# Patient Record
Sex: Female | Born: 1970 | Race: White | Hispanic: No | Smoking: Current every day smoker
Health system: Southern US, Community
[De-identification: ages and names within clinical notes are randomized; demographics above are authoritative.]

## PROBLEM LIST (undated history)

## (undated) DIAGNOSIS — E538 Deficiency of other specified B group vitamins: Secondary | ICD-10-CM

## (undated) DIAGNOSIS — K219 Gastro-esophageal reflux disease without esophagitis: Secondary | ICD-10-CM

## (undated) DIAGNOSIS — M545 Low back pain, unspecified: Secondary | ICD-10-CM

## (undated) DIAGNOSIS — F32A Depression, unspecified: Secondary | ICD-10-CM

## (undated) DIAGNOSIS — R0609 Other forms of dyspnea: Secondary | ICD-10-CM

## (undated) DIAGNOSIS — F329 Major depressive disorder, single episode, unspecified: Secondary | ICD-10-CM

## (undated) DIAGNOSIS — M1990 Unspecified osteoarthritis, unspecified site: Secondary | ICD-10-CM

## (undated) DIAGNOSIS — R0600 Dyspnea, unspecified: Secondary | ICD-10-CM

## (undated) DIAGNOSIS — J45909 Unspecified asthma, uncomplicated: Secondary | ICD-10-CM

## (undated) DIAGNOSIS — M79604 Pain in right leg: Secondary | ICD-10-CM

## (undated) DIAGNOSIS — T4145XA Adverse effect of unspecified anesthetic, initial encounter: Secondary | ICD-10-CM

## (undated) DIAGNOSIS — G8929 Other chronic pain: Secondary | ICD-10-CM

## (undated) DIAGNOSIS — I6529 Occlusion and stenosis of unspecified carotid artery: Secondary | ICD-10-CM

## (undated) DIAGNOSIS — F419 Anxiety disorder, unspecified: Secondary | ICD-10-CM

## (undated) DIAGNOSIS — K644 Residual hemorrhoidal skin tags: Secondary | ICD-10-CM

## (undated) HISTORY — DX: Depression, unspecified: F32.A

## (undated) HISTORY — PX: TUBAL LIGATION: SHX77

## (undated) HISTORY — DX: Unspecified asthma, uncomplicated: J45.909

## (undated) HISTORY — PX: HARDWARE REMOVAL: SHX979

## (undated) HISTORY — DX: Major depressive disorder, single episode, unspecified: F32.9

## (undated) HISTORY — DX: Pain in right leg: M79.604

## (undated) HISTORY — DX: Occlusion and stenosis of unspecified carotid artery: I65.29

## (undated) HISTORY — DX: Anxiety disorder, unspecified: F41.9

## (undated) HISTORY — DX: Deficiency of other specified B group vitamins: E53.8

---

## 1996-10-12 HISTORY — PX: APPENDECTOMY: SHX54

## 1998-09-13 ENCOUNTER — Ambulatory Visit (HOSPITAL_COMMUNITY): Admission: RE | Admit: 1998-09-13 | Discharge: 1998-09-13 | Payer: Self-pay | Admitting: Obstetrics and Gynecology

## 1998-09-18 ENCOUNTER — Ambulatory Visit (HOSPITAL_COMMUNITY): Admission: RE | Admit: 1998-09-18 | Discharge: 1998-09-18 | Payer: Self-pay | Admitting: Obstetrics and Gynecology

## 1998-10-03 ENCOUNTER — Encounter (HOSPITAL_COMMUNITY): Admission: RE | Admit: 1998-10-03 | Discharge: 1998-10-28 | Payer: Self-pay | Admitting: Obstetrics and Gynecology

## 1998-10-10 ENCOUNTER — Ambulatory Visit (HOSPITAL_COMMUNITY): Admission: RE | Admit: 1998-10-10 | Discharge: 1998-10-10 | Payer: Self-pay | Admitting: Obstetrics and Gynecology

## 1998-10-10 ENCOUNTER — Encounter: Payer: Self-pay | Admitting: Obstetrics and Gynecology

## 1998-10-15 ENCOUNTER — Encounter: Payer: Self-pay | Admitting: Obstetrics and Gynecology

## 1998-10-22 ENCOUNTER — Encounter: Payer: Self-pay | Admitting: Obstetrics and Gynecology

## 1998-10-26 ENCOUNTER — Inpatient Hospital Stay (HOSPITAL_COMMUNITY): Admission: AD | Admit: 1998-10-26 | Discharge: 1998-10-30 | Payer: Self-pay | Admitting: Obstetrics and Gynecology

## 1998-10-26 ENCOUNTER — Inpatient Hospital Stay (HOSPITAL_COMMUNITY): Admission: AD | Admit: 1998-10-26 | Discharge: 1998-10-26 | Payer: Self-pay | Admitting: Obstetrics and Gynecology

## 1998-12-09 ENCOUNTER — Other Ambulatory Visit: Admission: RE | Admit: 1998-12-09 | Discharge: 1998-12-09 | Payer: Self-pay | Admitting: Obstetrics and Gynecology

## 1998-12-31 ENCOUNTER — Other Ambulatory Visit: Admission: RE | Admit: 1998-12-31 | Discharge: 1998-12-31 | Payer: Self-pay | Admitting: Obstetrics and Gynecology

## 1999-08-18 ENCOUNTER — Encounter: Payer: Self-pay | Admitting: Surgery

## 1999-08-18 ENCOUNTER — Other Ambulatory Visit: Admission: RE | Admit: 1999-08-18 | Discharge: 1999-08-18 | Payer: Self-pay | Admitting: Obstetrics and Gynecology

## 1999-08-18 ENCOUNTER — Encounter: Admission: RE | Admit: 1999-08-18 | Discharge: 1999-08-18 | Payer: Self-pay | Admitting: Surgery

## 2000-07-19 ENCOUNTER — Other Ambulatory Visit: Admission: RE | Admit: 2000-07-19 | Discharge: 2000-07-19 | Payer: Self-pay | Admitting: Obstetrics and Gynecology

## 2001-08-12 ENCOUNTER — Other Ambulatory Visit: Admission: RE | Admit: 2001-08-12 | Discharge: 2001-08-12 | Payer: Self-pay | Admitting: Obstetrics and Gynecology

## 2002-04-11 DIAGNOSIS — T8859XA Other complications of anesthesia, initial encounter: Secondary | ICD-10-CM

## 2002-04-11 HISTORY — PX: FEMUR FRACTURE SURGERY: SHX633

## 2002-04-11 HISTORY — DX: Other complications of anesthesia, initial encounter: T88.59XA

## 2002-04-11 HISTORY — PX: ORIF FOOT FRACTURE: SHX2123

## 2002-06-20 ENCOUNTER — Ambulatory Visit (HOSPITAL_BASED_OUTPATIENT_CLINIC_OR_DEPARTMENT_OTHER): Admission: RE | Admit: 2002-06-20 | Discharge: 2002-06-20 | Payer: Self-pay | Admitting: Orthopedic Surgery

## 2002-08-12 HISTORY — PX: HARDWARE REMOVAL: SHX979

## 2003-02-01 ENCOUNTER — Other Ambulatory Visit: Admission: RE | Admit: 2003-02-01 | Discharge: 2003-02-01 | Payer: Self-pay | Admitting: Obstetrics and Gynecology

## 2004-08-21 ENCOUNTER — Ambulatory Visit (HOSPITAL_COMMUNITY): Admission: RE | Admit: 2004-08-21 | Discharge: 2004-08-21 | Payer: Self-pay | Admitting: Obstetrics and Gynecology

## 2005-05-07 ENCOUNTER — Ambulatory Visit: Payer: Self-pay | Admitting: Internal Medicine

## 2005-05-13 ENCOUNTER — Ambulatory Visit: Payer: Self-pay | Admitting: Internal Medicine

## 2005-06-19 ENCOUNTER — Ambulatory Visit: Payer: Self-pay | Admitting: Internal Medicine

## 2005-06-24 ENCOUNTER — Ambulatory Visit: Payer: Self-pay | Admitting: Internal Medicine

## 2005-07-31 ENCOUNTER — Encounter: Admission: RE | Admit: 2005-07-31 | Discharge: 2005-07-31 | Payer: Self-pay | Admitting: Orthopedic Surgery

## 2005-11-17 ENCOUNTER — Ambulatory Visit (HOSPITAL_COMMUNITY): Admission: RE | Admit: 2005-11-17 | Discharge: 2005-11-18 | Payer: Self-pay | Admitting: Orthopedic Surgery

## 2005-11-17 ENCOUNTER — Encounter (INDEPENDENT_AMBULATORY_CARE_PROVIDER_SITE_OTHER): Payer: Self-pay | Admitting: *Deleted

## 2006-01-06 ENCOUNTER — Other Ambulatory Visit: Admission: RE | Admit: 2006-01-06 | Discharge: 2006-01-06 | Payer: Self-pay | Admitting: Orthopedic Surgery

## 2006-07-09 ENCOUNTER — Ambulatory Visit: Payer: Self-pay | Admitting: Internal Medicine

## 2006-07-16 ENCOUNTER — Ambulatory Visit: Payer: Self-pay | Admitting: Internal Medicine

## 2006-09-10 ENCOUNTER — Ambulatory Visit: Payer: Self-pay | Admitting: Internal Medicine

## 2006-09-10 LAB — CONVERTED CEMR LAB: Vitamin B-12: 471 pg/mL (ref 211–911)

## 2006-09-17 ENCOUNTER — Ambulatory Visit: Payer: Self-pay | Admitting: Internal Medicine

## 2006-10-21 ENCOUNTER — Encounter
Admission: RE | Admit: 2006-10-21 | Discharge: 2007-01-19 | Payer: Self-pay | Admitting: Physical Medicine and Rehabilitation

## 2006-10-21 ENCOUNTER — Ambulatory Visit: Payer: Self-pay | Admitting: Physical Medicine and Rehabilitation

## 2006-11-12 HISTORY — PX: FEMUR HARDWARE REMOVAL: SUR1124

## 2006-11-26 ENCOUNTER — Ambulatory Visit (HOSPITAL_COMMUNITY)
Admission: RE | Admit: 2006-11-26 | Discharge: 2006-11-26 | Payer: Self-pay | Admitting: Physical Medicine and Rehabilitation

## 2006-12-17 ENCOUNTER — Ambulatory Visit: Payer: Self-pay | Admitting: Physical Medicine and Rehabilitation

## 2007-01-11 HISTORY — PX: FEMUR SURGERY: SHX943

## 2007-02-11 ENCOUNTER — Ambulatory Visit: Payer: Self-pay | Admitting: Physical Medicine and Rehabilitation

## 2007-02-11 ENCOUNTER — Encounter
Admission: RE | Admit: 2007-02-11 | Discharge: 2007-05-12 | Payer: Self-pay | Admitting: Physical Medicine and Rehabilitation

## 2007-04-13 ENCOUNTER — Ambulatory Visit: Payer: Self-pay | Admitting: Physical Medicine and Rehabilitation

## 2007-05-18 ENCOUNTER — Encounter
Admission: RE | Admit: 2007-05-18 | Discharge: 2007-08-16 | Payer: Self-pay | Admitting: Physical Medicine and Rehabilitation

## 2007-06-17 ENCOUNTER — Ambulatory Visit: Payer: Self-pay | Admitting: Physical Medicine and Rehabilitation

## 2007-07-18 ENCOUNTER — Encounter: Payer: Self-pay | Admitting: Internal Medicine

## 2007-07-19 ENCOUNTER — Encounter: Payer: Self-pay | Admitting: Internal Medicine

## 2007-07-19 ENCOUNTER — Ambulatory Visit: Payer: Self-pay | Admitting: Internal Medicine

## 2007-07-19 DIAGNOSIS — M79609 Pain in unspecified limb: Secondary | ICD-10-CM | POA: Insufficient documentation

## 2007-07-19 DIAGNOSIS — F32A Depression, unspecified: Secondary | ICD-10-CM | POA: Insufficient documentation

## 2007-07-19 DIAGNOSIS — F329 Major depressive disorder, single episode, unspecified: Secondary | ICD-10-CM | POA: Insufficient documentation

## 2007-07-19 DIAGNOSIS — G43909 Migraine, unspecified, not intractable, without status migrainosus: Secondary | ICD-10-CM | POA: Insufficient documentation

## 2007-07-19 DIAGNOSIS — E538 Deficiency of other specified B group vitamins: Secondary | ICD-10-CM | POA: Insufficient documentation

## 2007-07-19 LAB — CONVERTED CEMR LAB
ALT: 15 units/L (ref 0–35)
AST: 15 units/L (ref 0–37)
Albumin: 3.6 g/dL (ref 3.5–5.2)
Alkaline Phosphatase: 77 units/L (ref 39–117)
BUN: 4 mg/dL — ABNORMAL LOW (ref 6–23)
Basophils Absolute: 0 10*3/uL (ref 0.0–0.1)
Basophils Relative: 0.5 % (ref 0.0–1.0)
Bilirubin Urine: NEGATIVE
Bilirubin, Direct: 0.1 mg/dL (ref 0.0–0.3)
CO2: 29 meq/L (ref 19–32)
Calcium: 9.2 mg/dL (ref 8.4–10.5)
Chloride: 110 meq/L (ref 96–112)
Creatinine, Ser: 0.7 mg/dL (ref 0.4–1.2)
Crystals: NEGATIVE
Eosinophils Absolute: 0.3 10*3/uL (ref 0.0–0.6)
Eosinophils Relative: 2.7 % (ref 0.0–5.0)
Folate: 1.5 ng/mL
GFR calc Af Amer: 122 mL/min
GFR calc non Af Amer: 101 mL/min
Glucose, Bld: 81 mg/dL (ref 70–99)
HCT: 32.9 % — ABNORMAL LOW (ref 36.0–46.0)
Hemoglobin, Urine: NEGATIVE
Hemoglobin: 11.3 g/dL — ABNORMAL LOW (ref 12.0–15.0)
Ketones, ur: NEGATIVE mg/dL
Lymphocytes Relative: 42.3 % (ref 12.0–46.0)
MCHC: 34.4 g/dL (ref 30.0–36.0)
MCV: 92.7 fL (ref 78.0–100.0)
Monocytes Absolute: 0.6 10*3/uL (ref 0.2–0.7)
Monocytes Relative: 6.1 % (ref 3.0–11.0)
Mucus, UA: NEGATIVE
Neutro Abs: 4.7 10*3/uL (ref 1.4–7.7)
Neutrophils Relative %: 48.4 % (ref 43.0–77.0)
Nitrite: NEGATIVE
Platelets: 360 10*3/uL (ref 150–400)
Potassium: 4.3 meq/L (ref 3.5–5.1)
RBC / HPF: NONE SEEN
RBC: 3.55 M/uL — ABNORMAL LOW (ref 3.87–5.11)
RDW: 12.5 % (ref 11.5–14.6)
Sodium: 145 meq/L (ref 135–145)
Specific Gravity, Urine: <=1.005 (ref 1.000–1.03)
TSH: 1.06 microintl units/mL (ref 0.35–5.50)
Total Bilirubin: 0.6 mg/dL (ref 0.3–1.2)
Total Protein, Urine: NEGATIVE mg/dL
Total Protein: 6.9 g/dL (ref 6.0–8.3)
Urine Glucose: NEGATIVE mg/dL
Urobilinogen, UA: 0.2 (ref 0.0–1.0)
Vitamin B-12: >1500 pg/mL — ABNORMAL HIGH (ref 211–911)
WBC: 9.7 10*3/uL (ref 4.5–10.5)
pH: 6 (ref 5.0–8.0)

## 2007-08-16 ENCOUNTER — Ambulatory Visit: Payer: Self-pay | Admitting: Physical Medicine and Rehabilitation

## 2007-08-17 ENCOUNTER — Encounter
Admission: RE | Admit: 2007-08-17 | Discharge: 2007-11-15 | Payer: Self-pay | Admitting: Physical Medicine and Rehabilitation

## 2007-10-11 ENCOUNTER — Telehealth: Payer: Self-pay | Admitting: Internal Medicine

## 2007-10-17 ENCOUNTER — Ambulatory Visit: Payer: Self-pay | Admitting: Physical Medicine and Rehabilitation

## 2007-10-24 ENCOUNTER — Encounter: Payer: Self-pay | Admitting: Internal Medicine

## 2007-10-24 ENCOUNTER — Ambulatory Visit: Payer: Self-pay | Admitting: Internal Medicine

## 2007-10-24 DIAGNOSIS — R5381 Other malaise: Secondary | ICD-10-CM | POA: Insufficient documentation

## 2007-10-24 DIAGNOSIS — R5383 Other fatigue: Secondary | ICD-10-CM | POA: Insufficient documentation

## 2007-11-17 ENCOUNTER — Encounter
Admission: RE | Admit: 2007-11-17 | Discharge: 2008-02-15 | Payer: Self-pay | Admitting: Physical Medicine and Rehabilitation

## 2007-11-17 ENCOUNTER — Ambulatory Visit: Payer: Self-pay | Admitting: Physical Medicine and Rehabilitation

## 2007-12-16 ENCOUNTER — Ambulatory Visit: Payer: Self-pay | Admitting: Physical Medicine and Rehabilitation

## 2007-12-19 ENCOUNTER — Encounter: Payer: Self-pay | Admitting: Internal Medicine

## 2008-02-15 ENCOUNTER — Ambulatory Visit: Payer: Self-pay | Admitting: Physical Medicine and Rehabilitation

## 2008-02-20 ENCOUNTER — Telehealth: Payer: Self-pay | Admitting: Internal Medicine

## 2008-03-13 ENCOUNTER — Encounter
Admission: RE | Admit: 2008-03-13 | Discharge: 2008-06-11 | Payer: Self-pay | Admitting: Physical Medicine and Rehabilitation

## 2008-03-15 ENCOUNTER — Ambulatory Visit: Payer: Self-pay | Admitting: Physical Medicine and Rehabilitation

## 2008-04-10 ENCOUNTER — Telehealth: Payer: Self-pay | Admitting: Internal Medicine

## 2008-04-16 ENCOUNTER — Ambulatory Visit: Payer: Self-pay | Admitting: Internal Medicine

## 2008-04-16 ENCOUNTER — Encounter
Admission: RE | Admit: 2008-04-16 | Discharge: 2008-05-18 | Payer: Self-pay | Admitting: Physical Medicine and Rehabilitation

## 2008-04-16 ENCOUNTER — Ambulatory Visit: Payer: Self-pay | Admitting: Physical Medicine and Rehabilitation

## 2008-04-16 LAB — CONVERTED CEMR LAB
BUN: 6 mg/dL (ref 6–23)
CO2: 27 meq/L (ref 19–32)
Calcium: 9.1 mg/dL (ref 8.4–10.5)
Chloride: 111 meq/L (ref 96–112)
Creatinine, Ser: 0.7 mg/dL (ref 0.4–1.2)
Folate: 1.6 ng/mL
GFR calc Af Amer: 121 mL/min
GFR calc non Af Amer: 100 mL/min
Glucose, Bld: 88 mg/dL (ref 70–99)
Potassium: 4.4 meq/L (ref 3.5–5.1)
Sodium: 144 meq/L (ref 135–145)
TSH: 1.29 microintl units/mL (ref 0.35–5.50)
Vit D, 1,25-Dihydroxy: 38 (ref 30–89)
Vitamin B-12: 397 pg/mL (ref 211–911)

## 2008-04-23 ENCOUNTER — Ambulatory Visit: Payer: Self-pay | Admitting: Internal Medicine

## 2008-05-18 ENCOUNTER — Ambulatory Visit: Payer: Self-pay | Admitting: Physical Medicine and Rehabilitation

## 2008-06-13 ENCOUNTER — Encounter
Admission: RE | Admit: 2008-06-13 | Discharge: 2008-07-17 | Payer: Self-pay | Admitting: Physical Medicine and Rehabilitation

## 2008-06-13 ENCOUNTER — Ambulatory Visit: Payer: Self-pay | Admitting: Physical Medicine and Rehabilitation

## 2008-06-22 ENCOUNTER — Telehealth: Payer: Self-pay | Admitting: Internal Medicine

## 2008-07-17 ENCOUNTER — Ambulatory Visit: Payer: Self-pay | Admitting: Physical Medicine and Rehabilitation

## 2008-09-13 ENCOUNTER — Encounter
Admission: RE | Admit: 2008-09-13 | Discharge: 2008-09-14 | Payer: Self-pay | Admitting: Physical Medicine and Rehabilitation

## 2008-09-14 ENCOUNTER — Ambulatory Visit: Payer: Self-pay | Admitting: Physical Medicine and Rehabilitation

## 2008-10-10 ENCOUNTER — Telehealth: Payer: Self-pay | Admitting: Internal Medicine

## 2008-10-12 HISTORY — PX: SHOULDER ARTHROSCOPY: SHX128

## 2008-10-17 ENCOUNTER — Ambulatory Visit: Payer: Self-pay | Admitting: Internal Medicine

## 2008-10-17 LAB — CONVERTED CEMR LAB

## 2008-10-19 LAB — CONVERTED CEMR LAB
BUN: 4 mg/dL — ABNORMAL LOW (ref 6–23)
Basophils Absolute: 0.1 10*3/uL (ref 0.0–0.1)
Basophils Relative: 0.6 % (ref 0.0–3.0)
CO2: 29 meq/L (ref 19–32)
Calcium: 8.9 mg/dL (ref 8.4–10.5)
Chloride: 109 meq/L (ref 96–112)
Creatinine, Ser: 0.6 mg/dL (ref 0.4–1.2)
Eosinophils Absolute: 0.3 10*3/uL (ref 0.0–0.7)
Eosinophils Relative: 2.5 % (ref 0.0–5.0)
GFR calc Af Amer: 145 mL/min
GFR calc non Af Amer: 120 mL/min
Glucose, Bld: 92 mg/dL (ref 70–99)
HCT: 35.2 % — ABNORMAL LOW (ref 36.0–46.0)
Hemoglobin: 12 g/dL (ref 12.0–15.0)
Lymphocytes Relative: 20.3 % (ref 12.0–46.0)
MCHC: 34.2 g/dL (ref 30.0–36.0)
MCV: 95 fL (ref 78.0–100.0)
Monocytes Absolute: 0.8 10*3/uL (ref 0.1–1.0)
Monocytes Relative: 7.6 % (ref 3.0–12.0)
Neutro Abs: 7.2 10*3/uL (ref 1.4–7.7)
Neutrophils Relative %: 69 % (ref 43.0–77.0)
Platelets: 524 10*3/uL — ABNORMAL HIGH (ref 150–400)
Potassium: 4.1 meq/L (ref 3.5–5.1)
RBC: 3.7 M/uL — ABNORMAL LOW (ref 3.87–5.11)
RDW: 12.5 % (ref 11.5–14.6)
Sodium: 143 meq/L (ref 135–145)
TSH: 1.17 microintl units/mL (ref 0.35–5.50)
Vitamin B-12: 759 pg/mL (ref 211–911)
WBC: 10.5 10*3/uL (ref 4.5–10.5)

## 2008-10-25 ENCOUNTER — Ambulatory Visit: Payer: Self-pay | Admitting: Internal Medicine

## 2008-10-25 DIAGNOSIS — R7989 Other specified abnormal findings of blood chemistry: Secondary | ICD-10-CM | POA: Insufficient documentation

## 2008-10-25 DIAGNOSIS — J209 Acute bronchitis, unspecified: Secondary | ICD-10-CM | POA: Insufficient documentation

## 2009-01-09 ENCOUNTER — Encounter
Admission: RE | Admit: 2009-01-09 | Discharge: 2009-04-09 | Payer: Self-pay | Admitting: Physical Medicine and Rehabilitation

## 2009-01-16 ENCOUNTER — Ambulatory Visit: Payer: Self-pay | Admitting: Physical Medicine and Rehabilitation

## 2009-02-14 ENCOUNTER — Ambulatory Visit: Payer: Self-pay | Admitting: Physical Medicine and Rehabilitation

## 2009-04-22 ENCOUNTER — Ambulatory Visit: Payer: Self-pay | Admitting: Internal Medicine

## 2009-04-22 LAB — CONVERTED CEMR LAB
BUN: 8 mg/dL (ref 6–23)
Basophils Absolute: 0 10*3/uL (ref 0.0–0.1)
Basophils Relative: 0.6 % (ref 0.0–3.0)
CO2: 25 meq/L (ref 19–32)
Calcium: 9.1 mg/dL (ref 8.4–10.5)
Chloride: 113 meq/L — ABNORMAL HIGH (ref 96–112)
Creatinine, Ser: 0.7 mg/dL (ref 0.4–1.2)
Eosinophils Absolute: 0.1 10*3/uL (ref 0.0–0.7)
Eosinophils Relative: 0.8 % (ref 0.0–5.0)
GFR calc non Af Amer: 99.37 mL/min (ref >60)
Glucose, Bld: 92 mg/dL (ref 70–99)
HCT: 37.8 % (ref 36.0–46.0)
Hemoglobin: 12.7 g/dL (ref 12.0–15.0)
Lymphocytes Relative: 32 % (ref 12.0–46.0)
Lymphs Abs: 2.1 10*3/uL (ref 0.7–4.0)
MCHC: 33.5 g/dL (ref 30.0–36.0)
MCV: 93.7 fL (ref 78.0–100.0)
Monocytes Absolute: 0.6 10*3/uL (ref 0.1–1.0)
Monocytes Relative: 9.3 % (ref 3.0–12.0)
Neutro Abs: 3.8 10*3/uL (ref 1.4–7.7)
Neutrophils Relative %: 57.3 % (ref 43.0–77.0)
Platelets: 370 10*3/uL (ref 150.0–400.0)
Potassium: 3.9 meq/L (ref 3.5–5.1)
RBC: 4.04 M/uL (ref 3.87–5.11)
RDW: 13.7 % (ref 11.5–14.6)
Sodium: 145 meq/L (ref 135–145)
Vitamin B-12: 283 pg/mL (ref 211–911)
WBC: 6.6 10*3/uL (ref 4.5–10.5)

## 2009-04-25 ENCOUNTER — Encounter
Admission: RE | Admit: 2009-04-25 | Discharge: 2009-07-24 | Payer: Self-pay | Admitting: Physical Medicine and Rehabilitation

## 2009-04-26 ENCOUNTER — Ambulatory Visit: Payer: Self-pay | Admitting: Internal Medicine

## 2009-04-26 DIAGNOSIS — F17200 Nicotine dependence, unspecified, uncomplicated: Secondary | ICD-10-CM | POA: Insufficient documentation

## 2009-04-26 DIAGNOSIS — R071 Chest pain on breathing: Secondary | ICD-10-CM | POA: Insufficient documentation

## 2009-04-26 DIAGNOSIS — Z720 Tobacco use: Secondary | ICD-10-CM | POA: Insufficient documentation

## 2009-04-29 ENCOUNTER — Ambulatory Visit: Payer: Self-pay | Admitting: Physical Medicine and Rehabilitation

## 2009-06-24 ENCOUNTER — Ambulatory Visit: Payer: Self-pay | Admitting: Physical Medicine and Rehabilitation

## 2009-07-15 ENCOUNTER — Encounter
Admission: RE | Admit: 2009-07-15 | Discharge: 2009-10-03 | Payer: Self-pay | Admitting: Physical Medicine and Rehabilitation

## 2009-07-23 ENCOUNTER — Ambulatory Visit: Payer: Self-pay | Admitting: Physical Medicine and Rehabilitation

## 2009-07-29 ENCOUNTER — Telehealth: Payer: Self-pay | Admitting: Internal Medicine

## 2009-08-19 ENCOUNTER — Telehealth: Payer: Self-pay | Admitting: Internal Medicine

## 2009-08-20 ENCOUNTER — Ambulatory Visit: Payer: Self-pay | Admitting: Physical Medicine and Rehabilitation

## 2009-08-28 ENCOUNTER — Telehealth: Payer: Self-pay | Admitting: Internal Medicine

## 2009-09-04 ENCOUNTER — Encounter: Payer: Self-pay | Admitting: Internal Medicine

## 2009-09-16 ENCOUNTER — Ambulatory Visit: Payer: Self-pay | Admitting: Physical Medicine and Rehabilitation

## 2009-09-20 ENCOUNTER — Telehealth: Payer: Self-pay | Admitting: Internal Medicine

## 2009-10-17 ENCOUNTER — Encounter
Admission: RE | Admit: 2009-10-17 | Discharge: 2010-01-15 | Payer: Self-pay | Admitting: Physical Medicine and Rehabilitation

## 2009-10-18 ENCOUNTER — Ambulatory Visit: Payer: Self-pay | Admitting: Physical Medicine and Rehabilitation

## 2009-11-13 ENCOUNTER — Telehealth: Payer: Self-pay | Admitting: Internal Medicine

## 2009-11-20 ENCOUNTER — Telehealth: Payer: Self-pay | Admitting: Internal Medicine

## 2010-01-29 ENCOUNTER — Encounter
Admission: RE | Admit: 2010-01-29 | Discharge: 2010-04-23 | Payer: Self-pay | Admitting: Physical Medicine and Rehabilitation

## 2010-02-05 ENCOUNTER — Ambulatory Visit: Payer: Self-pay | Admitting: Physical Medicine and Rehabilitation

## 2010-03-05 ENCOUNTER — Ambulatory Visit: Payer: Self-pay | Admitting: Physical Medicine and Rehabilitation

## 2010-03-11 ENCOUNTER — Telehealth: Payer: Self-pay | Admitting: Internal Medicine

## 2010-03-20 ENCOUNTER — Telehealth: Payer: Self-pay | Admitting: Internal Medicine

## 2010-04-02 ENCOUNTER — Ambulatory Visit: Payer: Self-pay | Admitting: Physical Medicine and Rehabilitation

## 2010-04-23 ENCOUNTER — Encounter
Admission: RE | Admit: 2010-04-23 | Discharge: 2010-07-22 | Payer: Self-pay | Admitting: Physical Medicine and Rehabilitation

## 2010-04-30 ENCOUNTER — Ambulatory Visit: Payer: Self-pay | Admitting: Physical Medicine and Rehabilitation

## 2010-05-16 ENCOUNTER — Telehealth: Payer: Self-pay | Admitting: Internal Medicine

## 2010-06-02 ENCOUNTER — Ambulatory Visit: Payer: Self-pay | Admitting: Physical Medicine and Rehabilitation

## 2010-06-10 ENCOUNTER — Telehealth: Payer: Self-pay | Admitting: Internal Medicine

## 2010-06-13 ENCOUNTER — Ambulatory Visit: Payer: Self-pay | Admitting: Internal Medicine

## 2010-06-13 LAB — CONVERTED CEMR LAB
ALT: 14 units/L (ref 0–35)
AST: 18 units/L (ref 0–37)
Albumin: 3.2 g/dL — ABNORMAL LOW (ref 3.5–5.2)
Alkaline Phosphatase: 104 units/L (ref 39–117)
BUN: 5 mg/dL — ABNORMAL LOW (ref 6–23)
Basophils Absolute: 0.1 10*3/uL (ref 0.0–0.1)
Basophils Relative: 1 % (ref 0.0–3.0)
Bilirubin Urine: NEGATIVE
Bilirubin, Direct: 0.1 mg/dL (ref 0.0–0.3)
CO2: 27 meq/L (ref 19–32)
Calcium: 9 mg/dL (ref 8.4–10.5)
Chloride: 106 meq/L (ref 96–112)
Cholesterol: 233 mg/dL — ABNORMAL HIGH (ref 0–200)
Creatinine, Ser: 0.7 mg/dL (ref 0.4–1.2)
Direct LDL: 165.6 mg/dL
Eosinophils Absolute: 0.2 10*3/uL (ref 0.0–0.7)
Eosinophils Relative: 2.5 % (ref 0.0–5.0)
GFR calc non Af Amer: 105.71 mL/min (ref >60)
Glucose, Bld: 73 mg/dL (ref 70–99)
HCT: 37.7 % (ref 36.0–46.0)
HDL: 33.7 mg/dL — ABNORMAL LOW (ref >39.00)
Hemoglobin, Urine: NEGATIVE
Hemoglobin: 12.9 g/dL (ref 12.0–15.0)
Ketones, ur: NEGATIVE mg/dL
Leukocytes, UA: NEGATIVE
Lymphocytes Relative: 28.6 % (ref 12.0–46.0)
Lymphs Abs: 2.8 10*3/uL (ref 0.7–4.0)
MCHC: 34.3 g/dL (ref 30.0–36.0)
MCV: 96 fL (ref 78.0–100.0)
Monocytes Absolute: 0.6 10*3/uL (ref 0.1–1.0)
Monocytes Relative: 6.1 % (ref 3.0–12.0)
Neutro Abs: 6 10*3/uL (ref 1.4–7.7)
Neutrophils Relative %: 61.8 % (ref 43.0–77.0)
Nitrite: NEGATIVE
Platelets: 378 10*3/uL (ref 150.0–400.0)
Potassium: 3.9 meq/L (ref 3.5–5.1)
RBC: 3.93 M/uL (ref 3.87–5.11)
RDW: 13.4 % (ref 11.5–14.6)
Sodium: 140 meq/L (ref 135–145)
Specific Gravity, Urine: 1.025 (ref 1.000–1.030)
TSH: 0.74 microintl units/mL (ref 0.35–5.50)
Total Bilirubin: 0.4 mg/dL (ref 0.3–1.2)
Total CHOL/HDL Ratio: 7
Total Protein, Urine: NEGATIVE mg/dL
Total Protein: 6.4 g/dL (ref 6.0–8.3)
Triglycerides: 274 mg/dL — ABNORMAL HIGH (ref 0.0–149.0)
Urine Glucose: NEGATIVE mg/dL
Urobilinogen, UA: 0.2 (ref 0.0–1.0)
VLDL: 54.8 mg/dL — ABNORMAL HIGH (ref 0.0–40.0)
WBC: 9.7 10*3/uL (ref 4.5–10.5)
pH: 6 (ref 5.0–8.0)

## 2010-06-18 ENCOUNTER — Ambulatory Visit: Payer: Self-pay | Admitting: Internal Medicine

## 2010-06-18 DIAGNOSIS — M545 Low back pain, unspecified: Secondary | ICD-10-CM | POA: Insufficient documentation

## 2010-06-18 DIAGNOSIS — F411 Generalized anxiety disorder: Secondary | ICD-10-CM

## 2010-06-18 DIAGNOSIS — F419 Anxiety disorder, unspecified: Secondary | ICD-10-CM | POA: Insufficient documentation

## 2010-06-23 ENCOUNTER — Telehealth: Payer: Self-pay | Admitting: Internal Medicine

## 2010-11-05 ENCOUNTER — Encounter: Payer: Self-pay | Admitting: Internal Medicine

## 2010-11-11 NOTE — Progress Notes (Signed)
Summary: REFILL - FIORICET  Phone Note Refill Request Message from:  Pharmacy  Refills Requested: Medication #1:  FIORICET 50-325-40 MG TABS 1-2 qid prn Initial call taken by: Lamar Sprinkles, CMA,  Mar 11, 2010 8:07 AM  Follow-up for Phone Call        ok 3 ref Follow-up by: Tresa Garter MD,  Mar 11, 2010 2:20 PM    Prescriptions: FIORICET 50-325-40 MG TABS [facility]) 1-2 qid prn  #60 Tablet x 2   Entered by:   Lamar Sprinkles, CMA   Authorized by:   Tresa Garter MD   Signed by:   Lamar Sprinkles, CMA on 03/11/2010   Method used:   Electronically to        [facility] ([facility])       [address]       [place], [place]  [address]       Ph: [phone]       Fax: [phone]   RxID:   [id number]

## 2010-11-11 NOTE — Progress Notes (Signed)
Summary: medication  Phone Note Call from Patient Call back at Home Phone [phone]   Caller: Patient Reason for Call: Refill Medication Summary of Call: Patient called requesting to know why her fioricet request was denied per pharmacy. She also wants to know if effexor was changed to prozac due to cost. Initial call taken by: Rock Nephew CMA,  November 13, 2009 1:47 PM  Follow-up for Phone Call        Pt states she has used all refills on Fioricet and needs refills auth. She also wants to switch effexor to prozac Follow-up by: Ami Bullins CMA,  November 13, 2009 2:02 PM  Additional Follow-up for Phone Call Additional follow up Details #1::        OK Fioricet ref x 3 Sch well w/labs in 3 months  We can switch to Prozac, however it is a different medication Additional Follow-up by: Tresa Garter MD,  November 13, 2009 5:11 PM    Additional Follow-up for Phone Call Additional follow up Details #2::    Notified pt [facility] also inform pt abot cpx with labs in 3 months. pt states ok will call back to schedule. Follow-up by: Orlan Leavens,  November 14, 2009 1:22 PM  New/Updated Medications: FLUOXETINE HCL 20 MG TABS (FLUOXETINE HCL) 1 by mouth qd Prescriptions: FIORICET 50-325-40 MG TABS (BUTALBITAL-APAP-CAFFEINE) 1-2 qid prn  #60 x 3   Entered by:   Rock Nephew CMA   Authorized by:   Tresa Garter MD   Signed by:   Rock Nephew CMA on 11/14/2009   Method used:   Electronically to        [facility]       [address]       [place], [place]  [address]       Ph: [phone]       Fax: [phone]   RxID:   [id number] FLUOXETINE HCL 20 MG TABS (FLUOXETINE HCL) 1 by mouth qd  #90 x 3   Entered and Authorized by:   Tresa Garter MD   Signed by:   Rock Nephew CMA on 11/14/2009   Method used:   Electronically to        [facility]       [address]       [place], [place]  [address]       Ph: [phone]       Fax: [phone]   RxID:   [id number]
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
## 2010-11-11 NOTE — Progress Notes (Signed)
Summary: REFILL   Phone Note Refill Request   Refills Requested: Medication #1:  FIORICET 50-325-40 MG TABS 1-2 qid prn Initial call taken by: Lamar Sprinkles,  April 10, 2008 2:03 PM  Follow-up for Phone Call        OK s ref Follow-up by: Tresa Garter MD,  April 10, 2008 4:07 PM      Prescriptions: FIORICET 50-325-40 MG TABS [facility]) 1-2 qid prn  #60 x 0   Entered by:   Rock Nephew CMA   Authorized by:   Tresa Garter MD   Signed by:   Rock Nephew CMA on 04/10/2008   Method used:   Faxed to ...       [facility]       [address]       [place], [place]  [address]       Ph: [phone]       Fax: [phone]   RxID:   [id number]

## 2010-11-11 NOTE — Progress Notes (Signed)
Summary: added labs  Phone Note Call from Patient   Caller: pt  Summary of Call: pt called wants to have test added to blood work for her appt 10/26/07.......she needs it to be added by jan 7th when she does her labs.[facility] Laws did not say what test ..tried to call pt no answer left message to call office back  Initial call taken by: Windell Norfolk,  October 10, 2008 4:02 PM  Follow-up for Phone Call        pt called and left vm that she wants to add HIV test to  labs that she is doing before her cpx.......is this ok to add? Follow-up by: Windell Norfolk,  October 11, 2008 9:08 AM  Additional Follow-up for Phone Call Additional follow up Details #1::        ok to add hiv screen- v01.6 Additional Follow-up by: Corwin Levins MD,  October 11, 2008 12:29 PM    Additional Follow-up for Phone Call Additional follow up Details #2::    Patient notified//lab added in ID[facility] Follow-up by: Rock Nephew CMA,  October 15, 2008 9:28 AM

## 2010-11-11 NOTE — Progress Notes (Signed)
Summary: REFILL  Phone Note Refill Request   Refills Requested: Medication #1:  FIORICET 50-325-40 MG TABS 1-2 qid prn   Last Refilled: 02/12/2008 Initial call taken by: Lamar Sprinkles,  Feb 20, 2008 8:35 AM  Follow-up for Phone Call        OK  with 2 ref Follow-up by: Tresa Garter MD,  Feb 20, 2008 12:51 PM      Prescriptions: FIORICET 50-325-40 MG TABS [facility]) 1-2 qid prn  #60 x 2   Entered by:   Lamar Sprinkles   Authorized by:   Tresa Garter MD   Signed by:   Lamar Sprinkles on 02/20/2008   Method used:   Telephoned to ...       [facility]       [address]       [place], [place]  [address]       Ph: [phone]       Fax: [phone]   RxID:   [id number]

## 2010-11-11 NOTE — Progress Notes (Signed)
Summary: chantix refill  Medications Added BUTALBITAL-APAP-CAFFEINE 50-325-40 MG TABS (BUTALBITAL-APAP-CAFFEINE)  CYANOCOBALAMIN 1000 MCG/ML SOLN (CYANOCOBALAMIN)  FLUOXETINE HCL 10 MG CAPS (FLUOXETINE HCL)  CHANTIX STARTING MONTH PAK 0.5 MG X 11 & 1 MG X 42  MISC (VARENICLINE TARTRATE) As directed CHANTIX CONTINUING MONTH PAK 1 MG  TABS (VARENICLINE TARTRATE) As directed       Phone Note Call from Patient Call back at [facility] Phone [phone]   Summary of Call: Patient is requesting a refill of chantix. Next office visit is January.  Initial call taken by: Lamar Sprinkles,  October 11, 2007 11:39 AM  Follow-up for Phone Call        ok - to ann - ok for 3 mo tx Follow-up by: Corwin Levins MD,  October 11, 2007 1:02 PM  Additional Follow-up for Phone Call Additional follow up Details #1::        [facility] MD Additional Follow-up by: Rock Nephew CMA,  October 11, 2007 1:21 PM    New/Updated Medications: BUTALBITAL-APAP-CAFFEINE 50-325-40 MG TABS (BUTALBITAL-APAP-CAFFEINE)  CYANOCOBALAMIN 1000 MCG/ML SOLN (CYANOCOBALAMIN)  FLUOXETINE HCL 10 MG CAPS (FLUOXETINE HCL)  CHANTIX STARTING MONTH PAK 0.5 MG X 11 & 1 MG X 42  MISC (VARENICLINE TARTRATE) As directed CHANTIX CONTINUING MONTH PAK 1 MG  TABS (VARENICLINE TARTRATE) As directed   Prescriptions: CHANTIX STARTING MONTH PAK 0.5 MG X 11 & 1 MG X 42  MISC (VARENICLINE TARTRATE) As directed  #0 x 1   Entered by:   Rock Nephew CMA   Authorized by:   Corwin Levins MD   Signed by:   Rock Nephew CMA on 10/11/2007   Method used:   Electronically sent to ...       [facility]       [address]       [place], [place]  [address]       Ph: [phone]       Fax: [phone]   RxID:   [id number] CHANTIX CONTINUING MONTH PAK 1 MG  TABS (VARENICLINE TARTRATE) As directed  #0 x 2   Entered by:   Rock Nephew CMA   Authorized by:   Corwin Levins MD   Signed by:    Rock Nephew CMA on 10/11/2007   Method used:   Electronically sent to ...       [facility]       [address]       [place], [place]  [address]       Ph: [phone]       Fax: [phone]   RxID:   [id number]     Appended Document: chantix refill    Prescriptions: CHANTIX CONTINUING MONTH PAK 1 MG  TABS (VARENICLINE TARTRATE) As directed  #1 x 2   Entered by:   Lamar Sprinkles   Authorized by:   Tresa Garter MD   Signed by:   Lamar Sprinkles on 10/11/2007   Method used:   Electronically sent to ...       [facility]       [address]       [place], [place]  [address]       Ph: [phone]       Fax: [phone]   RxID:   [id number] CHANTIX STARTING MONTH PAK  0.5 MG X 11 & 1 MG X 42  MISC (VARENICLINE TARTRATE) As directed  #1 x 0   Entered by:   Lamar Sprinkles   Authorized by:   Tresa Garter MD   Signed by:   Lamar Sprinkles on 10/11/2007   Method used:   Electronically sent to ...       [facility]       [address]       [place], [place]  [address]       Ph: [phone]       Fax: [phone]   RxID:   [id number]

## 2010-11-11 NOTE — Progress Notes (Signed)
Summary: Rf Fioricet  Phone Note Refill Request Message from:  Pharmacy  Refills Requested: Medication #1:  FIORICET 50-325-40 MG TABS 1-2 qid prn   Dosage confirmed as above?Dosage Confirmed   Supply Requested: 60   Last Refilled: 05/06/2010  Method Requested: Electronic Next Appointment Scheduled: none Initial call taken by: Lanier Prude, [facility]),  May 16, 2010 10:43 AM  Follow-up for Phone Call        ok 2 ref Sch  office visit pls Follow-up by: Tresa Garter MD,  May 16, 2010 12:51 PM  Additional Follow-up for Phone Call Additional follow up Details #1::        Rx called to pharmacy Additional Follow-up by: Lanier Prude, [facility]),  May 16, 2010 1:25 PM    Prescriptions: FIORICET 50-325-40 MG TABS [facility]) 1-2 qid prn  #60 Tablet x 2   Entered by:   Lanier Prude, [facility])   Authorized by:   Tresa Garter MD   Signed by:   Lanier Prude, CMA(AAMA) on 05/16/2010   Method used:   Telephoned to ...       [facility]       [address]       [place], [place]  [address]       Ph: [phone]       Fax: [phone]   RxID:   [id number]

## 2010-11-11 NOTE — Consult Note (Signed)
Summary: Ashok Cordia, MD  Ashok Cordia, MD   Imported By: Maryln Gottron 01/10/2008 15:53:43  _____________________________________________________________________  External Attachment:    Type:   Image     Comment:   External Document
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
## 2010-11-11 NOTE — Progress Notes (Signed)
Summary: medication  Phone Note From Pharmacy   Caller: [facility] Summary of Call: Per pharmacy Amrix 15mg  is too expensive. Patient is requesting a new rx for Dlain cyclobenzaprine. Please advise Initial call taken by: Rock Nephew CMA,  August 19, 2009 9:41 AM  Follow-up for Phone Call        OK Follow-up by: Tresa Garter MD,  August 19, 2009 12:58 PM    New/Updated Medications: CYCLOBENZAPRINE HCL 10 MG  TABS (CYCLOBENZAPRINE HCL) 1 by mouth 2 times daily as needed for back pain Prescriptions: CYCLOBENZAPRINE HCL 10 MG  TABS (CYCLOBENZAPRINE HCL) 1 by mouth 2 times daily as needed for back pain  #60 x 6   Entered and Authorized by:   Tresa Garter MD   Signed by:   Lamar Sprinkles, CMA on 08/19/2009   Method used:   Electronically to        [facility] (retail)       [address]       [place], [place]  [address]       Ph: [phone]       Fax: [phone]   RxID:   [id number]

## 2010-11-11 NOTE — Progress Notes (Signed)
Summary: Alt for MaxAir  Phone Note From Pharmacy   Caller: [facility] Summary of Call: rec fax stating pt is requesting cheaper alt for MaxAir Aer. Copay for this is $40.00. Please advise Initial call taken by: Lanier Prude, [facility]),  June 23, 2010 11:00 AM  Follow-up for Phone Call        Proair Follow-up by: Tresa Garter MD,  June 23, 2010 4:42 PM    New/Updated Medications: PROAIR HFA 108 (90 BASE) MCG/ACT AERS (ALBUTEROL SULFATE) 2 inh qid as needed Prescriptions: PROAIR HFA 108 (90 BASE) MCG/ACT AERS (ALBUTEROL SULFATE) 2 inh qid as needed  #3 x 3   Entered and Authorized by:   Tresa Garter MD   Signed by:   Tresa Garter MD on 06/23/2010   Method used:   Electronically to        [facility] (retail)       [address]       [place], [place]  [address]       Ph: [phone]       Fax: [phone]   RxID:   [id number]

## 2010-11-11 NOTE — Progress Notes (Signed)
Summary: RF  Phone Note Refill Request   Refills Requested: Medication #1:  FIORICET 50-325-40 MG TABS 1-2 qid prn Patient has used all refills.   Initial call taken by: Lamar Sprinkles, CMA,  July 29, 2009 3:00 PM  Follow-up for Phone Call        ok 3 ref Follow-up by: Tresa Garter MD,  July 30, 2009 4:42 PM    Prescriptions: FIORICET 50-325-40 MG TABS [facility]) 1-2 qid prn  #60 x 2   Entered by:   Lamar Sprinkles, CMA   Authorized by:   Tresa Garter MD   Signed by:   Lamar Sprinkles, CMA on 07/30/2009   Method used:   Telephoned to ...       [facility] (retail)       [address]       [place], [place]  [address]       Ph: [phone]       Fax: [phone]   RxID:   [id number]
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
## 2010-11-11 NOTE — Progress Notes (Signed)
Summary: REFILL  Phone Note Refill Request   Refills Requested: Medication #1:  FIORICET 50-325-40 MG TABS 1-2 qid prn Initial call taken by: Lamar Sprinkles,  June 22, 2008 11:24 AM  Follow-up for Phone Call        done hardcopy to sara Follow-up by: Corwin Levins MD,  June 22, 2008 1:08 PM  Additional Follow-up for Phone Call Additional follow up Details #1::        CALLED IN  Additional Follow-up by: Lamar Sprinkles,  June 22, 2008 4:45 PM      Prescriptions: FIORICET 50-325-40 MG TABS [facility]) 1-2 qid prn  #60 x 0   Entered and Authorized by:   Corwin Levins MD   Signed by:   Corwin Levins MD on 06/22/2008   Method used:   Print then Give to Patient   RxID:   [id number]

## 2010-11-11 NOTE — Progress Notes (Signed)
Summary: med refill  Phone Note Refill Request Message from:  Fax from Pharmacy on March 20, 2010 9:50 AM  Refills Requested: Medication #1:  CYCLOBENZAPRINE HCL 10 MG  TABS 1 by mouth 2 times daily as needed for back pain Next Appointment Scheduled: none Initial call taken by: Lucious Groves,  March 20, 2010 9:50 AM  Follow-up for Phone Call        ok 3 ref Follow-up by: Tresa Garter MD,  March 20, 2010 1:15 PM    Prescriptions: CYCLOBENZAPRINE HCL 10 MG  TABS (CYCLOBENZAPRINE HCL) 1 by mouth 2 times daily as needed for back pain  #60 x 3   Entered by:   Rock Nephew CMA   Authorized by:   Tresa Garter MD   Signed by:   Rock Nephew CMA on 03/20/2010   Method used:   Electronically to        [facility] ([facility]ail)       [address]       [place], [place]  [address]       Ph: [phone]       Fax: [phone]   RxID:   [id number]
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
## 2010-11-11 NOTE — Progress Notes (Signed)
Summary: REQ FOR RF   Phone Note Refill Request   Refills Requested: Medication #1:  ZOLPIDEM TARTRATE 10 MG  TABS 1/2 or 1 by mouth at hs prn [facility]  Initial call taken by: Lamar Sprinkles, CMA,  June 10, 2010 11:05 AM  Follow-up for Phone Call        ok x 1 Needs OV Follow-up by: Tresa Garter MD,  June 10, 2010 2:16 PM  Additional Follow-up for Phone Call Additional follow up Details #1::        office visit next week, called in Additional Follow-up by: Lamar Sprinkles, CMA,  June 10, 2010 2:54 PM    Prescriptions: ZOLPIDEM TARTRATE 10 MG  TABS (ZOLPIDEM TARTRATE) 1/2 or 1 by mouth at hs prn  #30 x 0   Entered by:   Lamar Sprinkles, CMA   Authorized by:   Tresa Garter MD   Signed by:   Lamar Sprinkles, CMA on 06/10/2010   Method used:   Telephoned to ...       [facility] ([facility]ail)       [address]       [place], [place]  [address]       Ph: [phone]       Fax: [phone]   RxID:   [id number]

## 2010-11-11 NOTE — Progress Notes (Signed)
Summary: PA AMBIEN  Phone Note From Pharmacy   Caller: PA # [phone] [facility] Summary of Call: Pt's insurance requires PA on Generic Ambien 10mg . Proceed?  Initial call taken by: Lamar Sprinkles, CMA,  August 28, 2009 6:32 PM  Follow-up for Phone Call        Yes, pls Follow-up by: Tresa Garter MD,  August 29, 2009 12:47 PM  Additional Follow-up for Phone Call Additional follow up Details #1::        Spoke with [facility], form to be faxed, Case ID # [id number] Additional Follow-up by: Lamar Sprinkles, CMA,  September 04, 2009 11:19 AM    Additional Follow-up for Phone Call Additional follow up Details #2::    Form pending dr's signature.............................Marland KitchenLamar Sprinkles, CMA  September 04, 2009 11:31 AM   Form faxed yesterday...........................Marland KitchenLamar Sprinkles, CMA  September 10, 2009 8:47 AM   Approved 08/19/2009 to 09/09/2010, pt will be notified by [facility] Follow-up by: Lamar Sprinkles, CMA,  September 10, 2009 3:37 PM

## 2010-11-11 NOTE — Progress Notes (Signed)
Summary: zolpidem  Phone Note Other Incoming Call back at fax   [id number] Summary of Call: refill on ambien   ok x 5 refills---will fax back to pharmacy/vg Initial call taken by: Tora Perches,  November 20, 2009 12:58 PM    Prescriptions: ZOLPIDEM TARTRATE 10 MG  TABS (ZOLPIDEM TARTRATE) 1/2 or 1 by mouth at hs prn  #30 x 5   Entered by:   Tora Perches   Authorized by:   Tresa Garter MD   Signed by:   Tora Perches on 11/20/2009   Method used:   Telephoned to ...       [facility] (retail)       [address]       [place], [place]  [address]       Ph: [phone]       Fax: [phone]   RxID:   [id number]
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
## 2010-11-11 NOTE — Progress Notes (Signed)
Summary: REFILL  Phone Note Refill Request Call back at Home Phone [phone]   Refills Requested: Medication #1:  FIORICET 50-325-40 MG TABS 1-2 qid prn Initial call taken by: Lamar Sprinkles, CMA,  September 20, 2009 2:07 PM  Follow-up for Phone Call        OK to ref Follow-up by: Tresa Garter MD,  September 23, 2009 2:33 PM  Additional Follow-up for Phone Call Additional follow up Details #1::        Patient notified Additional Follow-up by: Rock Nephew CMA,  September 23, 2009 4:26 PM    Prescriptions: FIORICET 50-325-40 MG TABS [facility]) 1-2 qid prn  #60 x 2   Entered by:   Rock Nephew CMA   Authorized by:   Tresa Garter MD   Signed by:   Rock Nephew CMA on 09/23/2009   Method used:   Electronically to        [facility]       [address]       [place], [place]  [address]       Ph: [phone]       Fax: [phone]   RxID:   [id number]

## 2010-11-13 NOTE — Letter (Signed)
Summary: Primary Care Appointment Letter  [facility]  [address] [place], [place] [address]   Phone: [phone]  Fax: [phone]    11/05/2010 MRN: [id number]  [facility] [address] [place], [place]  [address]  Dear Ms. Streiff,   Your Primary Care Physician Tresa Garter MD has indicated that:    _______it is time to schedule an appointment.    _______you missed your appointment on______ and need to call and          reschedule.    _______you need to have lab work done.    _______you need to schedule an appointment discuss lab or test results.    ___X____you need to call to reschedule your appointment that is                       scheduled on December 18, 2010 with Dr. Posey Rea for a follow-up.  Please call the office.     Please call our office as soon as possible. Our phone number is [phone]. Please press option 1. Our office is open 8a-12noon and 1p-5p, Monday through Friday.     Thank you,    [facility]

## 2010-11-14 NOTE — Medication Information (Signed)
Summary: Prior [facility]ho for Zolpidem/[facility]  Prior [facility] for Zolpidem/[facility]   Imported By: Sherian Rein 09/11/2009 08:25:47  _____________________________________________________________________  External Attachment:    Type:   Image     Comment:   External Document

## 2010-12-11 ENCOUNTER — Other Ambulatory Visit: Payer: Self-pay

## 2010-12-15 ENCOUNTER — Telehealth: Payer: Self-pay | Admitting: Internal Medicine

## 2010-12-23 NOTE — Progress Notes (Signed)
Summary: Xanax Rf-Plot pt  Phone Note Refill Request Message from:  Fax from Pharmacy  Refills Requested: Medication #1:  ALPRAZOLAM 0.5 MG TABS 1 by mouth two times a day as needed anxiety   Dosage confirmed as above?Dosage Confirmed   Supply Requested: 60   Last Refilled: 10/11/2010  Method Requested: Telephone to Pharmacy Next Appointment Scheduled: 01-14-11 Initial call taken by: Lanier Prude, [facility]),  December 15, 2010 3:57 PM  Follow-up for Phone Call        ok to fill #60, no refills Follow-up by: Newt Lukes MD,  December 15, 2010 4:20 PM  Additional Follow-up for Phone Call Additional follow up Details #1::        Rx called to pharmacy Additional Follow-up by: Lanier Prude, [facility]  December 15, 2010 4:45 PM    Prescriptions: ALPRAZOLAM 0.5 MG TABS (ALPRAZOLAM) 1 by mouth two times a day as needed anxiety  #60 x 0   Entered by:   Lanier Prude, [facility])   Authorized by:   Tresa Garter MD   Signed by:   Lanier Prude, CMA(AAMA) on 12/15/2010   Method used:   Telephoned to ...       [facility]       [address]       [place], [place]  [address]       Ph: [phone]       Fax: [phone]   RxID:   [id number]

## 2010-12-30 ENCOUNTER — Other Ambulatory Visit: Payer: Self-pay | Admitting: Internal Medicine

## 2010-12-30 DIAGNOSIS — G44229 Chronic tension-type headache, not intractable: Secondary | ICD-10-CM

## 2011-01-02 ENCOUNTER — Telehealth: Payer: Self-pay | Admitting: *Deleted

## 2011-01-02 NOTE — Telephone Encounter (Signed)
Rf req for Ambien 10mg  1/2-1 qhs prn. Ok to rf?

## 2011-01-02 NOTE — Telephone Encounter (Signed)
Ok to refill Ambien #30 with one refill. Thank you

## 2011-01-05 MED ORDER — ZOLPIDEM TARTRATE 10 MG PO TABS: 5.0000 mg | ORAL_TABLET | Freq: Every evening | ORAL | Status: DC | PRN

## 2011-01-05 NOTE — Telephone Encounter (Signed)
rf phoned in 

## 2011-01-07 ENCOUNTER — Other Ambulatory Visit: Payer: Self-pay | Admitting: *Deleted

## 2011-01-07 ENCOUNTER — Other Ambulatory Visit: Payer: Self-pay

## 2011-01-07 DIAGNOSIS — E538 Deficiency of other specified B group vitamins: Secondary | ICD-10-CM

## 2011-01-14 ENCOUNTER — Ambulatory Visit: Payer: Self-pay | Admitting: Internal Medicine

## 2011-02-04 ENCOUNTER — Encounter: Payer: 59 | Attending: Physical Medicine and Rehabilitation | Admitting: Physical Medicine and Rehabilitation

## 2011-02-04 DIAGNOSIS — X58XXXS Exposure to other specified factors, sequela: Secondary | ICD-10-CM | POA: Insufficient documentation

## 2011-02-04 DIAGNOSIS — M545 Low back pain, unspecified: Secondary | ICD-10-CM

## 2011-02-04 DIAGNOSIS — M5137 Other intervertebral disc degeneration, lumbosacral region: Secondary | ICD-10-CM | POA: Insufficient documentation

## 2011-02-04 DIAGNOSIS — Q762 Congenital spondylolisthesis: Secondary | ICD-10-CM | POA: Insufficient documentation

## 2011-02-04 DIAGNOSIS — M25519 Pain in unspecified shoulder: Secondary | ICD-10-CM | POA: Insufficient documentation

## 2011-02-04 DIAGNOSIS — M25529 Pain in unspecified elbow: Secondary | ICD-10-CM

## 2011-02-04 DIAGNOSIS — G8929 Other chronic pain: Secondary | ICD-10-CM | POA: Insufficient documentation

## 2011-02-04 DIAGNOSIS — M51379 Other intervertebral disc degeneration, lumbosacral region without mention of lumbar back pain or lower extremity pain: Secondary | ICD-10-CM | POA: Insufficient documentation

## 2011-02-04 DIAGNOSIS — S8290XS Unspecified fracture of unspecified lower leg, sequela: Secondary | ICD-10-CM | POA: Insufficient documentation

## 2011-02-04 DIAGNOSIS — M79609 Pain in unspecified limb: Secondary | ICD-10-CM | POA: Insufficient documentation

## 2011-02-04 DIAGNOSIS — G894 Chronic pain syndrome: Secondary | ICD-10-CM

## 2011-02-04 DIAGNOSIS — M129 Arthropathy, unspecified: Secondary | ICD-10-CM | POA: Insufficient documentation

## 2011-02-04 DIAGNOSIS — R209 Unspecified disturbances of skin sensation: Secondary | ICD-10-CM | POA: Insufficient documentation

## 2011-02-04 DIAGNOSIS — G43909 Migraine, unspecified, not intractable, without status migrainosus: Secondary | ICD-10-CM | POA: Insufficient documentation

## 2011-02-05 NOTE — Assessment & Plan Note (Signed)
Vanessa Payne has returned to our [facility].  She was last seen back in August 2011.  She had been following up with Dr. Ranell Patrick for right shoulder labrum tear and eventually, underwent surgery to repair this and has been under his care up until recently she had her surgery on June 27, 2010.  She has undergone physical therapy, and he has been managing her pain medications up until recently as well.  She has back pain, and her main complaint is chronic low back pain.  She tells me her shoulder still bothers her.  Her pain is about the same as prior to surgery.  She believes her range of motion has improved slightly.  She is back at work working 50-60 hours.  She has had multiple social and family problems over the last several months including her husband who recently had some problems related to the melanoma and excision on his arm.  She has been under some stress with her family's illnesses and work.  Her average pain is about 8 on a scale of 10.  Pain is worse with standing and improves with rest and medication.  She has a little relief with current meds.  She has been off of all meds for a couple of days now.  She tells me that the gabapentin, which she slowly wean down off of over the last month or so, she finds that really had been helping her quite a bit.  She is anxious to get back on her gabapentin again.  FUNCTIONAL STATUS:  Essentially unchanged.  She works 50-55 hours a week.  She is able to climb stairs and drive.  She is independent with self-care, overall high functioning woman.  Denies problems with respect to review of systems or any new medical problems.  PHYSICAL EXAMINATION:  Today, her blood pressure is 115/69, pulse 97, respiration 18, 99% saturated on room air.  She is well-developed, well- nourished woman who does not appear in any distress.  She is oriented x3.  Speech is clear.  Affect is bright.  She is alert,  cooperative, and pleasant.  Follows commands without difficulty, answers my questions appropriately.  Cranial nerves and coordination are intact.  Reflexes are diminished in the right Achilles tendon to 1+ at the right patellar tendon, 2+ at the left patellar tendon, and left Achilles tendons.  She has patchy sensory deficits in the right lower extremity, which is not new.  She has a short right leg, and wears a built-up shoe on that side.  She has some limitations in lumbar motion, and her motor strength, however, is quite good in both upper and lower extremities. She has no limitations in cervical range of motion.  She has some limitations in shoulder range of motion.  On the left, she is able to abduct to about 120 degrees.  She has 75 degrees of external rotation, about 30 degrees of internal rotation on the left side.  IMPRESSION: 1. Status post recent left shoulder surgery by Dr. Ranell Patrick, June 27, 2010. 2. Right lower extremity pain, which is multifactorial in nature. 3. History of right femur fracture remotely, calcaneal fracture with     decreased range of motion, right foot numbness. 4. History of migraines. 5. History of mild degenerative disk disease with grade 2     spondylolisthesis, L4 on L5 with known facet arthropathy L4 and L5.  PLAN:  We will restart her gabapentin 300 mg 1 p.o. 5  times a day and two at bedtime, tramadol 1 p.o. q.i.d.  She tells me in the interim she has been on Topamax per Dr. Posey Rea as well as her Prozac was increased to 40 mg a day, however, with restarting tramadol would like her to decrease the Prozac by half to avoid drug interactions.  She will discuss this with Dr. Posey Rea as well.  Give her also some Vicodin 5/325 30 pills for the next 2 months to use on a p.r.n. basis for more severe back and leg pain.  She is comfortable with this treatment plan.  I have answered all her questions.  We will continue to monitor her  narcotic pain medications as well as random urine drug screens and pill count.     Brantley Stage, M.D.    DMK/MedQ D:  02/04/2011 14:31:59  T:  02/05/2011 03:24:35  Job #:  [id number]  cc:   Georgina Quint. Plotnikov, MD [address] [place] [address]

## 2011-02-16 ENCOUNTER — Telehealth: Payer: Self-pay | Admitting: *Deleted

## 2011-02-16 NOTE — Telephone Encounter (Signed)
OK to fill this prescription  On 3/20 with additional refills x0. Sch OV pls if not seen in 6 mo Thank you!

## 2011-02-16 NOTE — Telephone Encounter (Signed)
rec rf req for Alprazolam 0.5mg  1 po bid prn # 60. Last filled 12/30/10... Ok to Rf?

## 2011-02-17 MED ORDER — ALPRAZOLAM 0.5 MG PO TABS: 0.5000 mg | ORAL_TABLET | Freq: Two times a day (BID) | ORAL | Status: DC | PRN

## 2011-02-24 NOTE — Assessment & Plan Note (Signed)
Vanessa Payne is a pleasant 40 year old married woman who is a high  functioning individual.  She is a patient of Dr. Posey Rea.  Vanessa Payne  is followed in our [facility] initially for low back  pain and chronic right leg pain.  She is back in today for a refill of  her pain medications.  When she was initially seen in January 2008, she  had a significant amount of right lower extremity radiating pain.   Today, she has a very little pain.  At this time, she does have some  mild pain in the right low back and some occasional pain in the right  knee.  She does have a history of significant injury to the right femur  with a shortened right leg, had a comminuted fracture of the right femur  with reconstruction by Dr. Carola Frost back in February 2006.   She also has a known grade 2 spondylolisthesis at L5 and some facet  arthropathy at L4-L5 and L5-S1.   Overall, [name] has been doing very well with respect to her pain.  She continues to work about 55 hours a week, and also help take care of  her 2 children.  The pain is typically worse when she has been walking  or standing for a prolonged period of time; improves with rest and  medications.  She indicates on her functional status report that she can  walk at least an hour and a half at a time.  She is able to climb stairs  and drive.   REVIEW OF SYSTEMS:  Really noncontributory.  No problems reported by her  today.   No changes in her past medical, social, or family history since I last  saw her back on June 13, 2008.   PHYSICAL EXAMINATION:  VITAL SIGNS:  Today, her blood pressure is  138/71, pulse 113, respirations 18, and 98% saturated on room air.  GENERAL:  She is a well-developed, well-nourished woman who does not  appear in any distress.  She is oriented x3.  Speech is clear.  Her  affect is bright.  She is alert, cooperative, and pleasant; follows  commands without difficulty; answers all questions  appropriately.  NEUROLOGIC:  Cranial nerves and coordination are grossly intact.  Her  reflexes are 2+ at right and left patellar tendons, diminished with  right Achilles tendon and 2+ with the left Achilles tendon.  She has  excellent range of motion in both hips.  No tenderness is noted with  palpation around the right knee today.  She does have limitations in  motion at the ankle on the right.  EXTREMITIES:  Her right leg is shorter and she wears a built-up shoe on  that side as well.  Transitioning from sitting to standing she does with  ease and without evidence of discomfort.  Her gait is slightly uneven  due to her leg length discrepancy.  She complains of no pain during  this.  She has mild limitations in lumbar motion in forward flexion, but  full extension is noted without discomfort as well.  Overall, her tone  is normal.  There is no clonus noted.  Her balance is good.  Romberg  test is negative.   IMPRESSION:  1. Lumbago, which is not a significant problem for her at this time.  2. 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  3. Mild facet arthropathy L4-L5 and L5-S1.  4. Mild degenerative disk change at  L5-S1.  5. History of comminuted fracture of the right femur with shortening      of the right leg and reconstruction Dr. Carola Frost in 2006.  Status post      right heel fracture, right ankle fracture with limitations in      motion.  6. Right foot numbness secondary to nerve injury, claw toes on the      right secondary to nerve injury.  7. History of mild depression, overall doing very well.   PLAN:  We will continue her tramadol.  She has been taking this now for  several months and is doing very well with it.  She takes 50 mg 1-2  tablets up to 3 times a day.  No complaints of any problems with respect  to side effects, cognition changes, or constipation.  We will refill  this medicine and give her a 27-month supply in addition.  We will also  refill  her Lidoderm patches.  She finds these to be beneficial as well  and we will continue her on it.   We will see her back in 3 months.  Overall, she has done well  functionally as well as with respect to her pain.  She continues to  function at a very high level given significant functional deficits  including shortened right leg with diminished ankle range of motion and  lumbar spondylolisthesis.  She has not demonstrated any aberrant  behavior.  She has been taking her medications as prescribed and has  noted improvement with their use.           ______________________________  Brantley Stage, M.D.     DMK/MedQ  D:  09/14/2008 11:30:18  T:  09/15/2008 03:15:59  Job #:  [id number]   cc:   Georgina Quint. Plotnikov, MD  [address]  [place]  [place] [address]

## 2011-02-24 NOTE — Assessment & Plan Note (Signed)
Vanessa Payne is a 40 year old married female, who works full-time and is  also taking care of two children.  She is back into our pain and  rehabilitative clinic for a refill of her pain medications.   She was last seen by me on August 17, 2007.  In the interim, she has  obtained a knee brace through her orthopedist.  Last visit, she was  having some feelings of instability and had been tripping on her right  foot a bit.  After acquiring the knee brace through Dr. Ranell Patrick, she has  found that she is not tripping as much and the knee feels a good deal  more stable.   Apparently, x-rays were also done, although I do not have any records  regarding this, nor a note from her visit with him.   Her average pain is between a 4 and a 5 on a scale of 10.  Pain is  localized to the right low back.  She also has right-knee achiness and  some pain in the right forefoot.   Pain is described as dull, aching, and it is intermittent, although at  times it is more constant.   She gets fair relief with current meds that she is prescribed.  From out  clinic, she currently uses Prilosec 20 mg a day, Celebrex 200 mg daily,  Lidoderm on a p.r.n. basis, Topamax 25 mg two tablets twice a day,  Tramadol 50 mg up to two tablets a day and Vicodin 7.5/500 up to two  tablets per day.   She can walk about 45 minutes at a time.  She is able to climb stairs.  She drives.  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   She is independent with her self-care, overall a high-functioning  individual.  Denies any new problems with respect to her medical history  or surgical history.   Social/family history are otherwise also unchanged.   EXAM:  Her blood pressure is 128/66, pulse 82, respirations 18, 98%  saturated on room air.  She is a well-developed, well-nourished female, who does not appear in  any distress.  She is oriented times three.  Her speech is clear.  Her  affect is bright.  She is alert,  cooperative and pleasant.  She follows  commands without any problems.   Transitioning from sit to stand is done easily.  She has a built-up shoe  on the right.  Her gait is slightly uneven.  However, she is not hitting  her toe during early swing phase, as she did in the last visit.  Overall, her gait seems to be much improved.  She does have an ankle  brace, which she had been wearing, but it caused her some forefoot pain  and she has stopped using it.   Her reflexes in the right lower extremity are diminished.  Left lower  extremity 2+ at the patellar and Achilles tendon.  Motor strength is 5/5  in the left lower extremity, 4+/5 with the right knee flexors and 4+/5  with the EHL.  Straight leg raise is otherwise negative.  She has good  lumbar motion.  She does have some achiness in her low back when she  extends and laterally flexes to the left.  Her balance is quite good.  Tandem gait and Romberg tests are performed adequately.   IMPRESSION:  1. Right knee and ankle pain overall improved with the use of new knee  brace.  2. Lumbago.  3. Mild foot drop, overall improved, as well.  4. 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  5. Mild facet arthropathy at L4-L5 and L5-S1.  6. Mild degenerative disc disease, L5-S1.  7. Status post comminuted right femur fracture with short right leg.  8. Status post right heel and ankle fracture with range of motion      limitations.  9. Right foot numbness, secondary to nerve injury from above injuries.  10.Claw toes.  11.Mild depression, overall currently stable, being treated by Dr.      Posey Rea with trazodone.   PLAN:  Overall, gait has improved since last visit.  May consider  physical therapy, should she have continued problems.  Will have PT  assess gait and address areas of muscle imbalance and weakness, possibly  in January or February.   She did not bring in her ankle brace.  I would like to see this, as   well.  At this point it does not appear she needs the ankle brace.  Her  gait has improved from last visit.   We will refill the following medications for her today.   1. Tramadol 50 mg one to two p.o. q. 4-6 hours p.r.n. back or leg pain      #60, no refills.  2. Vicodin 7.5/500 one to two p.o. daily p.r.n. back or leg pain #60,      no refills.   We will have her seen in a nursing visit next month for refill of her  medications.  We will consider having her blood work checked, since she  is on Topamax, for bicarbonate levels.           ______________________________  Brantley Stage, M.D.     DMK/MedQ  D:  09/16/2007 69:62:95  T:  09/16/2007 11:43:10  Job #:  [id number]
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
## 2011-02-24 NOTE — Assessment & Plan Note (Signed)
Ms. Rhudy is a 40 year old married mother of 2 children who was last  seen by me on June 20, 2007.  She has had a nursing visit in the  interim for refill of her medications.  She also states she has followed  up with Dr. Posey Rea who has recently started her on Imitrex and  Trazodone.  She also states that she had some blood work done, which  was, apparently, all okay, per the patient.  She is back in today  requesting refills of her medications, as well as a slight escalation in  the dosage.  She states that over the last month or so she has had  increasing pain in her right knee, as well as her ankle.  She also  states that she has been tripping on her __________ , stubbing her toe.  Has not had any falls, however.  She currently attributes her increased  pain to the increased mobility she may have done.  She is requesting to  follow up with her previous orthopedic surgeon, Dr. Ranell Patrick, who has  prescribed her a knee brace in the past.  Her average pain is about a 7  on a scale of 10.  Currently, in the clinic right now, it is about a 5.  She describes her pain as being localized into the right low back region  and also discretely into the knee area.  She states that the knee aches.  She does get some swelling toward the end of the day in the leg as well.  Her right ankle also bothers her.  She feels that she has had some  instances where she may have had some lateral twisting type injuries.  She currently denies any numbness, tingling, weakness or burning  sensation into the leg.  She describes it mainly as an ache into the  right knee, as well as into the right foot.  No pain is noted in the  anterior tibial region.  No pain is noted in the anterior thigh or  posterior lateral thigh or buttock area.  Pain is typically worse with  walking and standing.  It improves with rest and medications.  She gets  fair relief with the current meds that she is on.  Her sleep tends to  vary  between poor and fair.  She is walking at least 30 minutes at a  time.  She is able to climb stairs.  She is able to drive.  She  functionally is able to work 50 hours a week and also take care of her  family at home.  She is a high functioning individual, is independent  with all of her ADLs, as well as high level activity.  She admits to  some depression.  This is currently being taken care of by Dr.  Posey Rea, who has placed her on some Trazodone.   REVIEW OF SYSTEMS:  Otherwise, noncontributory.   No other changes in her past medical history, social or family history.  She continues to smoke less than a pack-per-day of cigarettes. Denies  alcohol use.  She does keep her medications safe from her children.   MEDICATIONS FROM THIS CLINIC INCLUDE CURRENTLY:  1. Vicodin 5/500 1-2 p.o. daily, # 55 per month.  2. Tramadol 50 mg 1-2 p.o. q. 4-6 hours, #75 per month.  3. Celebrex 200 mg 30 per month.  She takes this up to 4 times a week.  4. Prilosec 20 mg daily.  5. Topamax 25 mg 2  tablets b.i.d.  6. Lidoderm on a p.r.n. basis  She currently has no complaints of any side effects from these  medications.  She has been taking them as prescribed.  She has been  using them appropriately.   On exam today her blood pressure is 113/75, pulse 78, respirations 18,  100% saturation on room air.  GENERAL:  Well-developed, well-nourished female who does not appear in  any distress.  She is oriented x3.  Her speech is clear.  Her affect is  bright.  She is alert, cooperative and pleasant.  She follows commands  without any difficulty.  She transitions from sitting to standing without any difficulty.  Her  gait in the room is remarkable for an uneven gait.  She does seem to  have a bit of a problem catching her toe during swing phase.  She does  not trip.  She is able to bring the foot through swing phase; however,  on occasion, when she does step, she does seem to hit this toe region  early in  the swing phase.  She complains of pain in the knee and the  ankle as she walks.  Her lumbar range is quite good in flexion without  discomfort.  She has limited flexion and reports some pain in the low  back on the right with extension.  Her reflexes are intact at the right  and left patella tendons, diminished and not present at all in the right  Achilles tendon, left Achilles is within normal limits.  There is no  abnormal tone appreciated in either extremity.  Her motor strength is  within normal range on manual muscle testing with the exception of EHL  on the right and she may have a little bit of weakness with eversion on  the right compared to the left.  Of note, she does have some ankle  deformity and has a history of trauma in this leg.  Sensory exam with  pinprick in the right and left lower extremity did not reveal any new  sensory deficits.  Appears to be intact in the L5, L4 and S1 dermatomes  on the right.   IMPRESSION:  1. New right knee and ankle pain.  2. Lumbago.  3. Occasionally stubbing right toe, possibly consistent with a mild      foot drop on this side now.  4. Status post Pars defect with grade 2 spondylolisthesis at L5.  5. Mild facet arthropathy at L4-5 and L5-S1.  6. Mild degenerative disk disease at L5-S1.  7. Status post comminuted right femur fracture with short right leg.  8. Status post right heel and ankle fracture about 3 years ago.  9. Right foot numbness secondary to nerve injury from above injuries.  10.Claw toes.  11.Mild depression, overall stable currently, being treated by Dr.      Posey Rea with Trazodone per the patient report.   PLAN:  Will refill the following medications for her.  Vicodin will increase this slightly 7.5/500 1-2 p.o. daily, #60 per  month alternating with Tramadol for days that are not as painful for her  50 mg 1-2 p.o. q. 4-6 hours p.r.n. back or leg pain, #60, no refills on  either of these.  Celebrex will be refilled  at 200 mg 1 p.o. daily, #30  on a p.r.n. basis.  She typically takes this up to 4 times a week.  Prilosec 20 mg 1 p.o. daily, #30.  Topamax 25 mg 2 p.o. b.i.d., #120.  She has been doing well on these medications, has not reported any  significant side effects with them, tolerating the medications well.  Pill counts have been appropriate.  Regarding her knee and ankle pain, had long discussion with the patient,  at least a half an hour in the office here today.  We will have her  follow up with her previous orthopedist for re-evaluation of these  joints to determine whether bracing may be an option for her.  We did  talk about knee bracing as well as an AFO for the right ankle.  Would  also consider, if she continues to have problems with this right ankle,  re-imaging her lumbar spine to see if we have any L5 root impingement,  given that she does have some history of some degenerative disk problems  at this level, as well as mild facet arthropathy and spondylolisthesis  grade 2.  Will see her back in a month.           ______________________________  Brantley Stage, M.D.     DMK/MedQ  D:  08/17/2007 09:24:49  T:  08/17/2007 15:48:24  Job #:  [id number]   cc:   Georgina Quint. Plotnikov, MD  [address]  [place]  [place] [address]
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
## 2011-02-24 NOTE — Assessment & Plan Note (Signed)
The patient is a 40 year old married female who works 50-55 hours a week  and also has two children.  She is back in today for a refill of her  medications and a brief recheck.  She has had some improvement in her  overall pain in the last month.  Average pain is about a 4 on a scale of  10.  She had been taking 7.5 mg of hydrocodone combined with 500 mg of  acetaminophen and states that she believes that she could go down on  that dose this next month.   She feels that the Celebrex helps as well as the Topamax.   She was tried on some Voltaren gel last month which she states did not  seem to really help her knee pain at all.   The pain is typically worse with walking and standing, improves with  rest and medications.  She gets fair relief with current medicines  prescribed by this clinic.   MEDICATIONS:  From our clinic include:  1. Prilosec 20 mg daily.  2. Celebrex 200 mg daily on p.r.n. basis, not more than 10 per month.  3. Lidoderm on p.r.n. basis.  4. Topamax 50 mg twice a day.  5. Vicodin 7.5/500 one to three per day.   FUNCTIONAL STATUS:  She is able to walk 40 minutes at a time.  She is  able to climb stairs and drive.  She works 50 hours a week.  She is a  high functioning individual independent with all of her ADL's and higher  level household tasks.   Denies problems controlling bowel or bladder.  Denies depression and  anxiety or suicidal ideation.   REVIEW OF SYSTEMS:  Noncontributory and negative.   PAST MEDICAL HISTORY:   SOCIAL HISTORY:   FAMILY HISTORY:  Also no changes from last visit.   PHYSICAL EXAMINATION:  VITAL SIGNS:  Weight is 125 pounds.  She states  that she typically has weighed 132 in the past.  Blood pressure 96/59,  pulse 80, respirations 18, 99% saturated on room air.  GENERAL:  She is a well-developed, well-nourished female who does not  appear in any distress.  She is oriented x3.  Her speech is clear.  Her  affect is bright.  She is  alert, cooperative, and pleasant.  She follows  commands easily.   Transitioning from sitting to standing is done with ease.   Reflexes are 2+ at the patellar tendons, 0-1+ at the right ankle, and 2+  at the left ankle.  She has sensory deficits in the right lower  extremity especially in the plantar surface of the right foot.   Her motor strength is quite good overall 5/5 at hip flexors, knee  extensors, dorsiflexors, and plantar flexors.   She has a short right leg.  She also lacks ankle range of motion in the  right ankle and motor testing is difficult due to this.   Transitioning from sitting to standing is done with ease.  Her gait in  the room is slightly uneven secondary to orthopedic problems in the  right lower extremity, short leg, and lack of full ankle motion on the  right.   She has some tenderness over the right sacroiliac joint and in the area  of the right lumbosacral junction.   Forward flexion does not bother her, returning back up from forward  flexion slightly increases her low back pain as does palpation over the  right sacroiliac joint.   Tone  is overall normal.  There is no clonus.  Her balance is good.  Romberg test is negative.   IMPRESSION:  1. Lumbago.  2. 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  3. Mild facet arthropathy at L4-5 and L5-S1.  4. Mild degenerative disk changes at L5-S1.  5. History of comminuted fracture of the right femur with short right      leg.  6. Status post right heel and right ankle fracture with range of      motion limitation.  7. 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  8. 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  9. Mild depression overall stable.   PLAN:  We will decrease her Vicodin again today.  She has been taking  7.5/500 one to three a day for the last month.  We will decrease her  dosage down to 500 one to three a day over the next month.   Anticipate  that she may not need hydrocodone in another month or so. She will  continue on Topamax.  She is not having any problems with this  medication and finds it to be quite beneficial.  I have reviewed the  risks and benefits again of the medication including getting her eye  pressures checked once a year.  She states that she will be having a  history and physical again through Golva V. Plotnikov, M.D.'s office  and some blood work in July.  We will get copies of the blood work so  that we do not need to repeat testing.   We will refill her Celebrex.  She has done well on this.  She has not  had any problems with this medication and finds it to be helpful.  We  will limit her use to not more than 10 days per month.  Again I reviewed  the risks and benefits of this particular medication with her.  She  understands and would like to continue with use.  She does have a  history of some GERD and takes Prilosec for that.   Overall, the patient has been doing quite well.  She remains very  functional and certainly has some significant orthopedic impairments  with respect to joint dysfunction and nerve injury.  She has done well  on the above medications, has not experienced any significant side  effects with them and is able to continue to work 50 hours a week and  help manage her home.  We will see her back in a month and we will send  a copy of our note to Dr. Posey Rea as well.           ______________________________  Brantley Stage, M.D.     DMK/MedQ  D:  12/19/2007 08:18:53  T:  12/19/2007 11:12:47  Job #:  [id number]   cc:   Georgina Quint. Plotnikov, MD  [address]  [place]  [place] [address]

## 2011-02-24 NOTE — Assessment & Plan Note (Signed)
Vanessa Payne is a 40 year old married female who is currently working 65  hours a week.  She works for a [employer] and this is her busy  season.   She is back in [facility] for refill of  her medications and a brief recheck.   She states over the last couple of weeks, she had to increase her hours  by about 15 hours.  She is currently waking up at 4 o'clock in the  morning and getting home about quarter to 7 at night.   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   Her average pain is about a 5-7 on a scale of 10.  Pain is predominantly  located in the right low back region around the right knee.   Pain is described as constant, aching, and dull, worse with activity.  Her sleep is relatively poor.  Pain improves with rest.  She is getting  some relief with current meds.   She is able to walk about 30 minutes at a time.  She is able to climb  stairs and she is driving.   Her functional status is quite high, working 65 hours a week,  predominantly in an office setting, independent with all of her self-  care.   No new complaints with respect to review of systems.   No changes in past medical, social, and family history.   MEDICATIONS:  Which are prescribed from this clinic include:  1. Prilosec 20 mg daily.  2. Celebrex 200 mg on a p.r.n. basis, not more than 10 tablets per      month typically.  3. Lidoderm p.r.n.  4. Topamax 50 mg twice a day.  5. Vicodin.  She was on 5/500 1-3 per day.   PHYSICAL EXAMINATION:  Today, blood pressure is 109/62, pulse 86,  respirations 22, and 99% saturated on O2.  She is a well-developed, well-nourished female, who does not appear in  any distress.  She is oriented x3.  Her speech is clear.  Her affect is  bright.  She is alert, cooperative, and pleasant.  She follows  commands  easily.  Cranial nerves are grossly intact.  Coordination is grossly intact.  She  is able to transition from sitting to standing easily.  Gait in the room  is slightly uneven.  She has significant leg-length discrepancy.  She  wears a brace on the right knee and has a built-up shoe on the right.  Tandem gait and Romberg test are all performed adequately.  Reflexes are symmetric and intact in the patellar and Achilles tendons.  No new sensory deficits are appreciated.  Motor strength at hip flexors,  knee extensors, and dorsiflexors are intact.  She does have notable claw  toes in the right foot.  MUSCULOSKELETAL:  She has about 25 degrees of forward flexion.  She  reports some discomfort with flexing forward.  Extension is somewhat  limited.  She does have back brace with her today, which she is wearing.  She has full extension of her right knee.  Effusion is not obviously  present.  She has tenderness with palpation over the distal femur.  Again, she has a right knee brace and a DonJoy double-hinged brace with  Velcro closure above and below the knee.  Overall, muscle tone is  normal.  There is no clonus.   IMPRESSION:  1. Lumbago.  2. Status post pars defects with grade II spondylolisthesis at L5.  3. Mild facet arthropathy at L4-L5 and L5-S1.  4. Mild degenerative disk changes at L5-S1.  5. History of comminuted fracture of the right femur with short right      leg.  6. Status post right heel and right ankle fracture with limitations      and range of motion at that joint.  7. 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  8. 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  9. Mild depression, which is overall stable.   PLAN:  I reviewed the risks and benefits of the medications prescribed  through this clinic.  She states she understands the risks and finds  that benefits outweigh the risks at this point, so that she can  continue  to maintain a functional lifestyle working to help provide for her  family and take care of her 2 children.   I have asked her that when she has an appointment apparently with Dr.  Posey Rea in the next week to review the need for a Prilosec.  At this  point, she does have a history of GERD, and she has been taking  Celebrex; however, not more than 10 tablets per month.  I do not  believe, she needs Prilosec from my standpoint; however, possibly with  her history of GERD, he may want to keep her on this.  We would like him  to assess this at the next visit with her.   Given that she is working an extra 15 hours a week over the last several  weeks and states that during the moving busy season, which will last  until about September, she would like to have her pain medications  slightly increase to help get her through this.  We will increase her  Vicodin to 7.5/500 up to 3 per day.  We will continue Topamax and p.r.n.  Celebrex as well.  I anticipate in September, I will be able to reduce  her Vicodin down to 5/500 tablet again.  We will see her back in 2  months.  Nursing visit next month.           ______________________________  Brantley Stage, M.D.     DMK/MedQ  D:  04/16/2008 09:30:28  T:  04/17/2008 00:27:06  Job #:  [id number]   cc:   Georgina Quint. Plotnikov, MD  [address]  [place]  [place] [address]

## 2011-02-24 NOTE — Assessment & Plan Note (Signed)
Vanessa Payne is a married 40 year old female who is very high functioning  individual working 60 hours a week.  She is back in today for a  recheck.  She was recently started last month on some Neurontin.  She  states that the Neurontin has significantly helped her pain.  She noted  when she was up to 4 days that her pain was dramatically reduced.  She  went from a 7 average on the scale of pain down to 4 well on the  Neurontin.  She ran out of the Neurontin and now she is back up.  She  reports that her pain is starting to worsening in her right lower  extremity.  She is requesting refill of medication and possibly a slight  increase in the dose as well.   She reports fair relief with meds at this time.   Pain is localized in the right low back, radiates to the right lower  extremity to the knee as well as the ankle.  She also has some  orthopedic-type pain, which is the related to a history of comminuted  fracture of the right femur and shortening of the right lower extremity.  She also had heel fracture, right ankle fracture with limitations in  motion as well as nerve injury to the right lower extremity as well.   No changes in function over the last month, still working 60 hours a  week, has a sedentary-type job, is able to climb stairs and drive, is  independent with all self-care and higher level household tasks.   REVIEW OF SYSTEMS:  Negative for problems controlling bowel or bladder,  depression, anxiety, or suicidal ideation.  No new problems with  numbness, tingling, or weakness.   No changes in past medical, social, or family history since last visit.   Medications provided through this clinic include the following:  1. Tramadol, she takes up to 4 tablets per day, not more than 120      tablets per month.  2. P.r.n. Lidoderm patch.  3. Neurontin 300 mg 1 p.o. q.i.d.  4. Prilosec on a p.r.n. basis as well.   PHYSICAL EXAMINATION:  VITAL SIGNS:  Today blood pressure is  99/63,  pulse 81, respiration 18, and 100% saturated on room air.  GENERAL:  She is well-developed, well-nourished female, who does not  appear in any distress.  She is oriented x3.  Speech is clear.  Affect  is bright.  She is alert, cooperative, and pleasant.  Follows commands  without difficulty, answers all the questions appropriately.  NEUROLOGIC:  Cranial nerves are intact.  Coordination is intact.  Reflexes are 2+ at the patellar tendon diminished at the Achilles  tendons bilaterally.  She has diminished range of motion in the right  ankle.  Claw toes noted in the right foot.  No new sensory deficits are  appreciated.  Her motor strength at the hip flexors, knee extensors, and  in bilateral lower extremities in the 5/5 range.  No pain with internal-  external rotation of the hip.  No pain with knee flexion and extension  of the knee today.   She has good range of motion with forward flexion with respect to the  lumbar spine.  No pain.  She has some limitations in lumbar extension  and mild discomfort with that.  Straight leg raise is negative.  Gait is  uneven due to leg length discrepancy, is not complaining of pain during  gait cycle.  She does  not display any pain behaviors.  Tandem gait and  Romberg tests are all performed adequately as well.   IMPRESSION:  1. Grade 2 spondylolisthesis at L5-S1.  2. Right lower extremity pain, multifactorial in nature.  Orthopedic      issues on the right as noted above as well as a component of      neuropathic pain, which has improved with the use of Neurontin.  3. History of mild facet arthropathy at L4-5, L5-S1 with some      discomfort noted with lumbar extension.  4. Mild degenerative disease at L5-S1 as well.  5. History of comminuted fracture of the right femur, shortening of      the right leg, reconstruction by Dr. Carola Frost in 2006, status post      right heel fracture, right ankle fracture with continued      limitations and  motion.  6. History of right foot numbness secondary to nerve injury, claw      toes, persist.  7. History of mild depression.  No problems with respect to this at      this time.   PLAN:  We will continue Neurontin slightly increasing the dose to a  total of 1800-2400 over the next month.  She has done well on it and  reports a 60% reduction in her leg pain.  She has been taking it at 6:00  a.m. noon, 5 and 9:00 p.m.  She states they do not make me drowsy at  all.  We will also add Voltaren 75 mg 1 p.o. b.i.d. p.r.n. back or leg  pain #30.  She states she is going to be adding a second job and over  the summer will be on her feet quite a bit, however, it will be  hopefully just a short term, increase in her activity load over the next  few months.  I will see her back next month to monitor how she is doing  on the Neurontin, asked her to take Prilosec when she takes a Voltaren  to protect her stomach and she continues to use her medications as  prescribed.  No aberrant behavior has been noted with their use.  She is  a high functioning individual despite multiple orthopedic and neurologic  problems.           ______________________________  Brantley Stage, M.D.     DMK/MedQ  D:  02/14/2009 09:02:50  T:  02/14/2009 22:18:11  Job #:  [id number]   cc:   Georgina Quint. Plotnikov, MD  [address]  [place]  [place] [address]
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
## 2011-02-27 NOTE — Assessment & Plan Note (Signed)
Vanessa Payne is a 40 year old married women who has 2 children.  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   She is referred by Dr. Georgina Quint. Plotnikov for management of her right  leg pain and low back pain.   She was seen for the first time last month, October 22, 2006.  She was  trailed on an evening dose of hydrocodone 10/325 mg as well as Lidoderm  up to 3 patches in a 12 hour period.   Her pain scores have improved slightly since that visit.  They were  approximately 6 or 7 last visit, they are down to 5 to 6 at this visit.  She reports an overall moderate improvement in her activity level  interference.   Sleep is fair.  She gets fair relief with the current MEDS that she is  on.   She is reporting some nausea she is getting intermittently at this  point, but denies any abdominal pain or discomfort.   She is able to walk about 10-15 minutes at a time and is able to climb  stairs.   Continues to work 40 hours a week.  Denies bowel or bladder control  problems.  Denies suicidal ideation, does admit to some depression and  is currently taken Prozac for that.   No other changes in her past medical, social or family history are  reported since our last visit.   Medications currently provided through this clinic include:  1. Lidoderm 5% patch 12 hours on, 12 hours off p.r.n.  2. Hydrocodone 10/325 mg 1 p.o. in the evening.  3. Ibuprofen 600 mg up to 3 times a day.  4. Fentanyl last month through Dr. [facility] Racer clinic 25 mg patch 1      every 2 days.   On exam her blood pressure is 124/77, pulse 96, respirations 16, 99%  saturated on room air.  She is a well-developed, well-nourished female  who appears her stated age.  She is orientated x3.  Her speech is clear.  Her affect is bright, alert.  She is pleasant.  She follows commands  without difficulty.   She transitions from sit to stand independently.  Her has good gait  mechanics.  No  evidence of analgia or pain behaviors displayed.  She has  some limitations in lumbar extension, fairly good range of motion with  flexion and lateral flexion today.  Her reflexes are intact in the lower  extremities.  She has 5/5 strength in hip flexors, knee extensors,  dorsiflexors, plantar flexors.   Straight leg raise is negative.   No tenderness over the lumbar paraspinal musculature.   IMPRESSION:  Complaint today is only of a mild to moderate right low  back pain.  She is not complaining of any leg pain today.  Imaging  through E-chart were reviewed.  There are no previous spine films or  imaging studies of her lumbar spine on the record there.   Today because of her nausea we will discontinue her ibuprofen and  continue her Prilosec, however.  We will obtain flexions/extensions of  her lumbar spine.  We will anticipate refill of her Fentanyl patch 25  mcg 1 patch q. 3 days.  Discussed adding Tylenol 500 mg up to 4 times a  day.  We will also give her a prescription for a lumbar support which  she can obtain through [facility].   Next month we will consider trailing her on some  Celebrex.  We will also  discuss and manage any constipation issues she may have with the use of  narcotics.  We will check her x-rays at that visit as well.  At this  point she states that she believes the pain patches help quite a bit to  manage her lower extremity pain.  She is quite pleased with this.  I  will see her back in a month.  She has been stable on these medications.  UDS was reviewed and is appropriate.           ______________________________  Brantley Stage, M.D.     DMK/MedQ  D:  11/19/2006 09:36:12  T:  11/19/2006 10:15:10  Job #:  [id number]   cc:   Georgina Quint. Plotnikov, MD  [address]  [place]  [place] [address]
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
## 2011-02-27 NOTE — Assessment & Plan Note (Signed)
HISTORY OF PRESENT ILLNESS:  [name] is a 40 year old married mother  of 2 children who was seen last by me on Feb 14, 2007.   [name] is being seen in our [facility] for  predominantly chronic low back pain.  She has a pars defect with grade 2  spondylolisthesis, mild facet arthropathy at L4-5 and L5-S1, as well as  mild degenerative disk changes at L5-S1.   Last month she was trialed on Topamax 25 mg 1 p.o. daily then titrating  up to twice a day.  She states she tolerated this well.  She feels that  it has helped her.  Her overall pain scores have improved since last  month.  Her average pain last month was between a 5 and a 6 on scale of  10; this month it is between a 2 and a 5 on a scale of 10.  She reported  moderate interference of her activity level from her pain, and this  month she reports just a little interference from her pain.  Pain  typically is located in the right low back region worst towards the end  of the day.  She is getting fair relief with the current medications  that she is on.   She has increased her work hours from 40 hours a week up to 48.  She  states that the summer through September is her busy season.   She reports no other medical problems since she was last seen.  She did  get a prescription of some p.r.n. Fioricet from Dr. Posey Rea for her  headaches.  She is hoping to use it very little once the Topamax is  titrated up a little further.  Otherwise, no other changes in social and  family history since last visit.   PHYSICAL EXAMINATION:  VITAL SIGNS:  Blood pressure 108/69.  Respirations 18.  100% saturations on room air.  GENERAL:  She is a well-developed, well-nourished female who appears her  stated age and does not appear in any distress.  She is oriented x3.  Her affect is bright, alert.  She is Cooperative, pleasant, and smiling.  She follows commands without difficulty.  Her speech is clear.  She transitions from  sitting to standing with no pain behaviors.  She  has a normal gait in the room.  She does have an elevated sole on the  right shoe due to leg length discrepancy.  Balance is quite good.  Tandem gait, Romberg tests are performed  adequately.  Lumbar range of motion is quite good this morning as well.  She has very  good forward flexion as well as lumbar extension and lateral flexion.  She reports no discomfort during these maneuvers today.  She states she  is having a good day.  Reflexes are diminished at the right Achilles tendon, 2+ at the left  Achilles tendon, 2+ at the patella tendon.  No new changes in her lower extremity with respect to motor or sensory  function.   IMPRESSION:  1. Lumbago.  2. Pars defect with grade 1 spondylolisthesis.  3. Mild facet arthropathy L4-5, L5-S1.  4. Mild degenerative disk changes L5-S1.  5. Status post comminuted right femur fracture.  6. Status post right heel ankle fracture 3 years ago.  7. Right foot numbness.  8. Mild depression.   PLAN:  We will refill the following medications for her:  Tramadol 1-2  p.o. q.4-6h. #75 per month for moderate back pain, Percocet  10/325 1-2  p.o. q.4-6h. p.r.n. severe back pain #55, Topamax 25 mg 1 p.o. t.i.d. x2  weeks then 2 p.o. b.i.d., Prilosec 20 mg 1 p.o. daily #30.  She also  continues to take Celebrex on a p.r.n. basis as well 200 mg 1 p.o.  daily.   She continues to function at a very high level working 48 hours a week,  taking care of 2 children.   She reports no problems with over sedation or constipation from the  medications.  Cautioned her to avoid medications while she drives.   Side effects of the various medications, once again, were reviewed with  her.  She states she understands.  We will see her back in a month.           ______________________________  Brantley Stage, M.D.     DMK/MedQ  D:  03/14/2007 09:15:21  T:  03/14/2007 09:39:31  Job #:  [id number]   cc:   Georgina Quint. Plotnikov, MD  [address]  [place]  [place] [address]

## 2011-02-27 NOTE — Assessment & Plan Note (Signed)
Vanessa Payne is a 40 year old woman who is working full-time and has 2  children.  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   She was referred by Dr. Posey Rea for management of right leg pain and  low back pain.   She was last seen by me November 19, 2006.   Since that time, she states overall her pain is improved.  Her average  pain score is about 4-5 on a scale of 10, only moderately interfering  with her activity levels.  Her sleep is fair.  She gets a little relief  with the current meds that she is on.   However, she has found the flexible lumbar corset to be quite beneficial  and she has also found the Lidoderm patches to be quite beneficial in  helping manage her pain.   She continues to work 40 hours a week.  She is a high functioning  individual, independent with all of her self-care, as well as higher  level household activities.   She admits to some depression.  Denies suicidal ideation or anxiety.   PAST MEDICAL HISTORY:  Reports no change since our last visit.   SOCIAL HISTORY:  Reports no change since our last visit.  She continues  to smoke a half pack of cigarettes.  I advised her against this as well.   FAMILY HISTORY:  Reports no change since our last visit.   MEDICATIONS:  Medications prescribed through this clinic include:  1. Lidoderm 5% patch 12 hours on, 12 hours off, up to 3 patches.  2. Hydrocodone 10/325 up to 1 per day.  3. Duragesic patch 25 microgram patch 1 q. 72hours.  4. Prilosec 20 mg 1 p.o. q. day.   PHYSICAL EXAM:  On exam today, her blood pressure is 118/64, pulse 83,  respirations 16, 99% saturated on room air.  She is a well-developed,  well-nourished female who appears her stated age.  She does not appear  in any distress.  She is oriented x 3.  Speech is clear.  Affect is  bright, alert, cooperative and pleasant.  She has a mildly antalgic gait  partially due to a leg length discrepancy.  She displays no pain  behavior, as  well as when she transitions from sit to stand nor walks in  the room; however, her lumbar motion is just minimally limited today.  Her reflexes are symmetric at the patellar tendons.  Decreased at the  right Achilles tendon and 2+ at the left Achilles tendon.  Her motor  strength is good throughout both lower extremities.   X-RAYS:  The flexion/extension films of the lumbar spine reveal  bilateral pars defect at L5 with grade 1 spondylolisthesis.  No  instability or subluxation with flexion or extension.  Mild facet  degenerative changes L4-5, L5-S1 and mild degenerative disk disease at  L5-S1 read by Dr. Pecolia Ades.   The results of these films were reviewed with her today.  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   IMPRESSION:  1. Lumbago.  2. Bilateral pars defect with grade 1 spondylolisthesis, mild facet      arthropathy at L4-5, L5-S1 and mild degenerative disk changes at L5-      S1.  3. Status-post right comminuted femur fracture.  4. Status-post right heel/ankle fracture 3 years ago.  5. Right foot numbness.  6. Mild depression.   PLAN:  Mr. Schnee states she is interested in weaning off  of a daily  narcotic and states her pain typically occurs intermittently throughout  the month, does not feel she needs daily narcotics to manage this pain  at this time, which is predominantly low back pain.  She feels that the  lumbar support, as well as the Lidoderm is helping her overall manage  her pain.   To that end, we will decrease her fentanyl patch down to a 12 microgram  patch for the next 2 weeks.  We will also give her a prescription for  some Percocet which she can take initially daily while she weans off of  the Duragesic.  Anticipate over the next couple of months she will be  off of narcotics for a good portion of the month and will be using them  just intermittently for flare-ups.  We will see her back in a month.  We  will refill her Duragesic 12  microgram patch, 1 patch per 72 hours, #5,  and we will give her a prescription for Percocet 10/325 one to two p.o.  b.i.d. to q.i.d., #130.  Anticipate over the next couple of months we  will be weaning down off of the Percocet as well once she is off the  Duragesic patch.           ______________________________  Brantley Stage, M.D.     DMK/MedQ  D:  12/20/2006 11:22:51  T:  12/20/2006 12:04:34  Job #:  [id number]
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
## 2011-02-27 NOTE — Op Note (Signed)
Vanessa Payne, PFLUGER              ACCOUNT NO.:  [id number]   MEDICAL RECORD NO.:  [id number]          PATIENT TYPE:  AMB   LOCATION:  SDC                           FACILITY:  [facility]   PHYSICIAN:  Zenaida Niece, M.D.DATE OF BIRTH:  02-17-1971   DATE OF PROCEDURE:  08/21/2004  DATE OF DISCHARGE:                                 OPERATIVE REPORT   PREOPERATIVE DIAGNOSES:  Desires permanent sterility.   POSTOPERATIVE DIAGNOSES:  Desires permanent sterility.   PROCEDURE:  Laparoscopic bilateral tubal fulguration.   SURGEON:  Zenaida Niece, M.D.   ANESTHESIA:  General endotracheal tube.   ESTIMATED BLOOD LOSS:  Less than 50 mL.   FINDINGS:  The patient had normal pelvic anatomy.   DESCRIPTION OF PROCEDURE:  The patient was taken to the operating room then  placed in the dorsal supine position. General anesthesia was induced and she  was placed in mobile stirrups.  Abdomen was prepped and draped in the usual  sterile fashion, bladder drained with a red rubber catheter, Hulka tenaculum  applied to the cervix for uterine manipulation.  Infraumbilical skin was  infiltrated with 0.25% Marcaine along her previous scar.  A 1 1/2 cm  horizontal incision was then made in her previous scar and the Veress needle  was inserted into the peritoneal cavity.  Placement was confirmed by the  water drop test and by an opening pressure of 3 mmHg.  CO2 gas was  insufflated to a pressure of 12 mmHg and the Veress needle was removed. The  size 11 disposable trocar was introduced and placement confirmed by the  laparoscope.  Inspection revealed normal anatomy with a very small uterus.  Both fallopian tubes were identified and traced to their fimbriated ends.  Three segments of each tube in the middle were fulgurated with bipolar  cautery until the amp meter read zero in all spots.  This achieved good  fulguration of both tubes and all sites were hemostatic.  The laparoscope  was removed and all gas  allowed to deflate from the abdomen. The umbilical  trocar was then removed. Skin incision was closed with interrupted  subcuticular sutures of 4-0 Vicryl followed by Steri-Strips and a Band-Aid.  The patient tolerated the procedure well and was taken to the recovery room  in stable condition. Counts were correct.     Todd   TDM/MEDQ  D:  08/21/2004  T:  08/21/2004  Job:  [id number]
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
## 2011-03-02 ENCOUNTER — Telehealth: Payer: Self-pay | Admitting: *Deleted

## 2011-03-02 NOTE — Telephone Encounter (Signed)
rec Rf req for alprazolam 0.5mg  1 po bid. # 60.      LAST FILLED 02-17-11...... Ok to Rf?

## 2011-03-02 NOTE — Telephone Encounter (Signed)
No early refills. OV pls. Thx

## 2011-03-03 NOTE — Telephone Encounter (Signed)
Per pharmacy pt last filled alprazolam on 12-30-10. They will fill one that was on hold from 02-17-11.  Pt has appt on 03-17-11.

## 2011-03-03 NOTE — Telephone Encounter (Signed)
Ok thx.

## 2011-03-16 ENCOUNTER — Encounter: Payer: Self-pay | Admitting: Internal Medicine

## 2011-03-17 ENCOUNTER — Ambulatory Visit (INDEPENDENT_AMBULATORY_CARE_PROVIDER_SITE_OTHER): Payer: 59 | Admitting: Internal Medicine

## 2011-03-17 ENCOUNTER — Encounter: Payer: Self-pay | Admitting: Internal Medicine

## 2011-03-17 DIAGNOSIS — F3289 Other specified depressive episodes: Secondary | ICD-10-CM

## 2011-03-17 DIAGNOSIS — G43119 Migraine with aura, intractable, without status migrainosus: Secondary | ICD-10-CM

## 2011-03-17 DIAGNOSIS — F17200 Nicotine dependence, unspecified, uncomplicated: Secondary | ICD-10-CM

## 2011-03-17 DIAGNOSIS — M79609 Pain in unspecified limb: Secondary | ICD-10-CM

## 2011-03-17 DIAGNOSIS — E538 Deficiency of other specified B group vitamins: Secondary | ICD-10-CM

## 2011-03-17 DIAGNOSIS — M545 Low back pain, unspecified: Secondary | ICD-10-CM

## 2011-03-17 DIAGNOSIS — F329 Major depressive disorder, single episode, unspecified: Secondary | ICD-10-CM

## 2011-03-17 DIAGNOSIS — R5381 Other malaise: Secondary | ICD-10-CM

## 2011-03-17 DIAGNOSIS — R5383 Other fatigue: Secondary | ICD-10-CM

## 2011-03-17 DIAGNOSIS — E559 Vitamin D deficiency, unspecified: Secondary | ICD-10-CM

## 2011-03-17 DIAGNOSIS — Z0000 Encounter for general adult medical examination without abnormal findings: Secondary | ICD-10-CM

## 2011-03-17 DIAGNOSIS — G43819 Other migraine, intractable, without status migrainosus: Secondary | ICD-10-CM

## 2011-03-17 MED ORDER — CYCLOBENZAPRINE HCL 10 MG PO TABS: 10.0000 mg | ORAL_TABLET | Freq: Two times a day (BID) | ORAL | Status: DC | PRN

## 2011-03-17 MED ORDER — CYANOCOBALAMIN 1000 MCG/ML IJ SOLN: 1000.0000 ug | INTRAMUSCULAR | Status: DC

## 2011-03-17 MED ORDER — BUTALBITAL-APAP-CAFF-COD 50-325-40-30 MG PO CAPS: 1.0000 | ORAL_CAPSULE | Freq: Two times a day (BID) | ORAL | Status: DC | PRN

## 2011-03-17 MED ORDER — OMEPRAZOLE 40 MG PO CPDR: 40.0000 mg | DELAYED_RELEASE_CAPSULE | Freq: Every day | ORAL | Status: DC

## 2011-03-17 MED ORDER — ALPRAZOLAM 0.5 MG PO TABS: 0.5000 mg | ORAL_TABLET | Freq: Two times a day (BID) | ORAL | Status: DC | PRN

## 2011-03-17 MED ORDER — ZOLPIDEM TARTRATE 10 MG PO TABS: 5.0000 mg | ORAL_TABLET | Freq: Every evening | ORAL | Status: DC | PRN

## 2011-03-17 MED ORDER — VENLAFAXINE HCL ER 150 MG PO CP24: 150.0000 mg | ORAL_CAPSULE | Freq: Every day | ORAL | Status: DC

## 2011-03-22 ENCOUNTER — Encounter: Payer: Self-pay | Admitting: Internal Medicine

## 2011-03-22 NOTE — Assessment & Plan Note (Signed)
On Fioricet.  Potential benefits of a long term Fioricet use as well as potential risks  and complications including rebound HAs were explained to the patient and were aknowledged.

## 2011-03-22 NOTE — Assessment & Plan Note (Signed)
On shots - will cont

## 2011-03-22 NOTE — Assessment & Plan Note (Signed)
Changed to Effexor

## 2011-03-22 NOTE — Assessment & Plan Note (Signed)
Discussed.

## 2011-03-22 NOTE — Assessment & Plan Note (Signed)
Needs to quit.  

## 2011-03-22 NOTE — Assessment & Plan Note (Signed)
On Rx per [facility]

## 2011-03-22 NOTE — Assessment & Plan Note (Signed)
[facility] 

## 2011-03-22 NOTE — Progress Notes (Signed)
  Subjective:    Patient ID: Vanessa Payne, female    DOB: 23-Mar-1971, 40 y.o.   MRN: [id number]  HPI   The patient is here to follow up on chronic depression, anxiety, headaches and chronic moderate pain symptoms controlled with medicines to some degree. She was very upset about waiting time (30 min) became loud and Security was called in.    Review of Systems  Constitutional: Negative for chills, activity change, appetite change, fatigue and unexpected weight change.  HENT: Negative for congestion, mouth sores and sinus pressure.   Eyes: Negative for visual disturbance.  Respiratory: Negative for cough and chest tightness.   Gastrointestinal: Negative for nausea and abdominal pain.  Genitourinary: Negative for frequency, difficulty urinating and vaginal pain.  Musculoskeletal: Positive for myalgias, arthralgias and gait problem. Negative for back pain.  Skin: Negative for pallor and rash.  Neurological: Negative for dizziness, tremors, weakness, numbness and headaches.  Psychiatric/Behavioral: Positive for sleep disturbance and dysphoric mood. Negative for suicidal ideas, hallucinations, confusion, self-injury and agitation. The patient is nervous/anxious. The patient is not hyperactive.        [Depressed, stressed      Objective:   Physical Exam  Constitutional: She appears well-developed and well-nourished. No distress.  HENT:  Head: Normocephalic.  Right Ear: External ear normal.  Left Ear: External ear normal.  Nose: Nose normal.  Mouth/Throat: Oropharynx is clear and moist.  Eyes: Conjunctivae are normal. Pupils are equal, round, and reactive to light. Right eye exhibits no discharge. Left eye exhibits no discharge.  Neck: Normal range of motion. Neck supple. No JVD present. No tracheal deviation present. No thyromegaly present.  Cardiovascular: Normal rate, regular rhythm and normal heart sounds.   Pulmonary/Chest: No stridor. No respiratory distress. She has no wheezes.   Abdominal: Soft. Bowel sounds are normal. She exhibits no distension and no mass. There is no tenderness. There is no rebound and no guarding.  Musculoskeletal: She exhibits no edema and no tenderness.  Lymphadenopathy:    She has no cervical adenopathy.  Neurological: She displays normal reflexes. No cranial nerve deficit. She exhibits normal muscle tone. Coordination normal.  Skin: No rash noted. No erythema.  Psychiatric: Her behavior is normal. Judgment and thought content normal.       Upset and bitter. Not homicidal          Assessment & Plan:   We discussed the problem with waiting time. I apologized; I said I can't guarantee perfect timing in the future. She knows she can find another PCP.  If she chooses to stay with [place], she is aware this type of behavior would be unacceptable here no matter how upset she is. The ball is in her court.

## 2011-03-30 ENCOUNTER — Encounter: Payer: 59 | Attending: Neurosurgery | Admitting: Neurosurgery

## 2011-03-30 ENCOUNTER — Ambulatory Visit: Payer: 59 | Admitting: Physical Medicine and Rehabilitation

## 2011-03-30 DIAGNOSIS — M545 Low back pain, unspecified: Secondary | ICD-10-CM | POA: Insufficient documentation

## 2011-03-30 DIAGNOSIS — M25529 Pain in unspecified elbow: Secondary | ICD-10-CM

## 2011-03-30 DIAGNOSIS — R269 Unspecified abnormalities of gait and mobility: Secondary | ICD-10-CM | POA: Insufficient documentation

## 2011-03-30 DIAGNOSIS — Z79899 Other long term (current) drug therapy: Secondary | ICD-10-CM | POA: Insufficient documentation

## 2011-03-30 DIAGNOSIS — M4310 Spondylolisthesis, site unspecified: Secondary | ICD-10-CM

## 2011-03-30 DIAGNOSIS — M25519 Pain in unspecified shoulder: Secondary | ICD-10-CM | POA: Insufficient documentation

## 2011-03-30 DIAGNOSIS — IMO0002 Reserved for concepts with insufficient information to code with codable children: Secondary | ICD-10-CM | POA: Insufficient documentation

## 2011-03-30 DIAGNOSIS — M79609 Pain in unspecified limb: Secondary | ICD-10-CM | POA: Insufficient documentation

## 2011-03-30 DIAGNOSIS — G43909 Migraine, unspecified, not intractable, without status migrainosus: Secondary | ICD-10-CM | POA: Insufficient documentation

## 2011-03-30 DIAGNOSIS — Q762 Congenital spondylolisthesis: Secondary | ICD-10-CM | POA: Insufficient documentation
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
## 2011-04-27 ENCOUNTER — Other Ambulatory Visit: Payer: Self-pay | Admitting: *Deleted

## 2011-04-27 NOTE — Telephone Encounter (Signed)
Sorry - comp error - see new order Thx

## 2011-04-27 NOTE — Telephone Encounter (Signed)
Rec fax from pharm stating new rx dated 03-17-11 for Fioricet with Codeine. Pt states she has never been on this med with codeine. Please advise if this is correct.

## 2011-04-28 MED ORDER — BUTALBITAL-APAP-CAFFEINE 50-325-40 MG PO TABS: 1.0000 | ORAL_TABLET | Freq: Two times a day (BID) | ORAL | Status: DC | PRN

## 2011-05-13 ENCOUNTER — Encounter: Payer: 59 | Attending: Neurosurgery | Admitting: Neurosurgery

## 2011-05-13 ENCOUNTER — Ambulatory Visit: Payer: 59 | Admitting: Physical Medicine and Rehabilitation

## 2011-05-13 DIAGNOSIS — M545 Low back pain, unspecified: Secondary | ICD-10-CM | POA: Insufficient documentation

## 2011-05-13 DIAGNOSIS — M25529 Pain in unspecified elbow: Secondary | ICD-10-CM

## 2011-05-13 DIAGNOSIS — M79609 Pain in unspecified limb: Secondary | ICD-10-CM | POA: Insufficient documentation

## 2011-05-13 DIAGNOSIS — Z79899 Other long term (current) drug therapy: Secondary | ICD-10-CM | POA: Insufficient documentation

## 2011-05-13 DIAGNOSIS — G43909 Migraine, unspecified, not intractable, without status migrainosus: Secondary | ICD-10-CM | POA: Insufficient documentation

## 2011-05-13 DIAGNOSIS — Q762 Congenital spondylolisthesis: Secondary | ICD-10-CM | POA: Insufficient documentation

## 2011-05-13 DIAGNOSIS — IMO0002 Reserved for concepts with insufficient information to code with codable children: Secondary | ICD-10-CM | POA: Insufficient documentation

## 2011-05-13 DIAGNOSIS — R269 Unspecified abnormalities of gait and mobility: Secondary | ICD-10-CM | POA: Insufficient documentation

## 2011-05-13 DIAGNOSIS — M25519 Pain in unspecified shoulder: Secondary | ICD-10-CM | POA: Insufficient documentation

## 2011-05-13 DIAGNOSIS — M5430 Sciatica, unspecified side: Secondary | ICD-10-CM
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
## 2011-06-10 ENCOUNTER — Encounter: Payer: 59 | Attending: Physical Medicine and Rehabilitation | Admitting: Physical Medicine and Rehabilitation

## 2011-06-10 DIAGNOSIS — Z79899 Other long term (current) drug therapy: Secondary | ICD-10-CM | POA: Insufficient documentation

## 2011-06-10 DIAGNOSIS — M4310 Spondylolisthesis, site unspecified: Secondary | ICD-10-CM

## 2011-06-10 DIAGNOSIS — R209 Unspecified disturbances of skin sensation: Secondary | ICD-10-CM | POA: Insufficient documentation

## 2011-06-10 DIAGNOSIS — M545 Low back pain, unspecified: Secondary | ICD-10-CM | POA: Insufficient documentation

## 2011-06-10 DIAGNOSIS — M25579 Pain in unspecified ankle and joints of unspecified foot: Secondary | ICD-10-CM

## 2011-06-10 DIAGNOSIS — M25569 Pain in unspecified knee: Secondary | ICD-10-CM

## 2011-06-10 DIAGNOSIS — Q762 Congenital spondylolisthesis: Secondary | ICD-10-CM | POA: Insufficient documentation

## 2011-06-10 DIAGNOSIS — G8929 Other chronic pain: Secondary | ICD-10-CM | POA: Insufficient documentation

## 2011-06-10 DIAGNOSIS — X58XXXS Exposure to other specified factors, sequela: Secondary | ICD-10-CM | POA: Insufficient documentation

## 2011-06-10 DIAGNOSIS — G894 Chronic pain syndrome: Secondary | ICD-10-CM

## 2011-06-10 DIAGNOSIS — M79609 Pain in unspecified limb: Secondary | ICD-10-CM | POA: Insufficient documentation

## 2011-06-10 DIAGNOSIS — G43909 Migraine, unspecified, not intractable, without status migrainosus: Secondary | ICD-10-CM | POA: Insufficient documentation

## 2011-06-10 DIAGNOSIS — S8290XS Unspecified fracture of unspecified lower leg, sequela: Secondary | ICD-10-CM | POA: Insufficient documentation
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
## 2011-06-19 ENCOUNTER — Other Ambulatory Visit: Payer: Self-pay | Admitting: Physical Medicine and Rehabilitation

## 2011-06-19 ENCOUNTER — Ambulatory Visit (HOSPITAL_COMMUNITY)
Admission: RE | Admit: 2011-06-19 | Discharge: 2011-06-19 | Disposition: A | Discharge status: Home / Self Care | Payer: 59 | Source: Ambulatory Visit | Attending: Physical Medicine and Rehabilitation | Admitting: Physical Medicine and Rehabilitation

## 2011-06-19 DIAGNOSIS — M545 Low back pain, unspecified: Secondary | ICD-10-CM

## 2011-06-19 DIAGNOSIS — R52 Pain, unspecified: Secondary | ICD-10-CM

## 2011-06-19 DIAGNOSIS — M2550 Pain in unspecified joint: Secondary | ICD-10-CM | POA: Insufficient documentation

## 2011-06-19 DIAGNOSIS — M2180 Other specified acquired deformities of unspecified limb: Secondary | ICD-10-CM | POA: Insufficient documentation

## 2011-06-22 ENCOUNTER — Ambulatory Visit: Payer: 59 | Admitting: Physical Medicine and Rehabilitation

## 2011-06-26 ENCOUNTER — Encounter: Payer: 59 | Attending: Physical Medicine and Rehabilitation | Admitting: Physical Medicine and Rehabilitation

## 2011-06-26 DIAGNOSIS — M79609 Pain in unspecified limb: Secondary | ICD-10-CM | POA: Insufficient documentation

## 2011-06-26 DIAGNOSIS — G8928 Other chronic postprocedural pain: Secondary | ICD-10-CM

## 2011-06-26 DIAGNOSIS — M545 Low back pain, unspecified: Secondary | ICD-10-CM

## 2011-06-26 DIAGNOSIS — M25569 Pain in unspecified knee: Secondary | ICD-10-CM
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
## 2011-06-27 ENCOUNTER — Other Ambulatory Visit: Payer: Self-pay | Admitting: Internal Medicine

## 2011-07-08 ENCOUNTER — Encounter: Payer: 59 | Attending: Physical Medicine and Rehabilitation | Admitting: Physical Medicine and Rehabilitation

## 2011-07-08 DIAGNOSIS — M79609 Pain in unspecified limb: Secondary | ICD-10-CM | POA: Insufficient documentation

## 2011-07-08 DIAGNOSIS — M545 Low back pain, unspecified: Secondary | ICD-10-CM

## 2011-07-08 DIAGNOSIS — G43909 Migraine, unspecified, not intractable, without status migrainosus: Secondary | ICD-10-CM | POA: Insufficient documentation

## 2011-07-08 DIAGNOSIS — Q762 Congenital spondylolisthesis: Secondary | ICD-10-CM | POA: Insufficient documentation

## 2011-07-08 DIAGNOSIS — R269 Unspecified abnormalities of gait and mobility: Secondary | ICD-10-CM

## 2011-07-08 DIAGNOSIS — R209 Unspecified disturbances of skin sensation: Secondary | ICD-10-CM | POA: Insufficient documentation

## 2011-07-08 NOTE — Assessment & Plan Note (Signed)
[name] is a pleasant 40 year old woman who is [facility] here [facility].  She was last seen by me on June 26, 2011.  She presents in today with predominately right lower extremity pain and numbness as well as right low back pain. Average pain is about 8 on a scale of 10.  It has been bothering her for several months now.  Sleep is poor.  Pain is worse with walking, standing, and bending.  Recent radiographs of her lumbar spine showed increased anterolisthesis at L5-S1.  No changes in her functional status from previous visit.  She has continued to take her pain medications as prescribed.  No evidence of aberrant behavior.  She is using hydrocodone 7.5/325 four times a day, p.r.n. Lidoderm, gabapentin 300 mg 5 times a day and 2 at night as well as tramadol 1 p.o. b.i.d.  There has been an escalation in her medication doses over the last couple months along with this increased pain in her right lower extremity.  PHYSICAL EXAMINATION:  Her blood pressure is 108/69, pulse 106, and respirations 16.  She is a well-developed, well-nourished woman who does not appear in any distress.  She is oriented x3.  Speech is clear. Affect is bright.  She is alert, cooperative, and pleasant.  Follows commands without difficulty.  Answers my questions appropriately. Cranial nerves, coordination are intact.  Her reflexes are 1+ at patellar tendon, diminished at the right Achilles tendon, 2+ at the left patellar tendon, and 1+ at the left Achilles tendon.  No abnormal tone, clonus, or tremors are noted.  She has patchy numbness throughout the right lower extremity.  Motor strength is for the most part 5/5.  She has some claw toe deformity indicating some intrinsic weakness which is not new.  Her right hip flexion may be about half a grade weaker on the right than on the left today.  She transitions from sitting to standing.  Her gait is uneven.  She has a  leg-length discrepancy, uses a lift in her shoe.  She has pain with flexion/extension of the lumbar spine.  IMPRESSION: 1. Chronic low back pain with radiation to the right lower extremity. 2. History of spondylolisthesis which has worsened since 2008. 3. Chronic right lower extremity pain which may be multifactorial in     nature.  She has a history of multiple fractures in this leg as     well. 4. History of migraines.  PLAN:  We would like to obtain MRI of her lumbar spine.  She over the last several months has had increasing right lower extremity pain which is consistent with some nerve root involvement.  She continues to work for 65-70 hours a weak.  We will see her back after the scan.  I have answered all of her questions.  She is comfortable with this plan at this time.     Vanessa Payne, M.D. Electronically Signed    DMK/MedQ D:  07/08/2011 13:21:47  T:  07/08/2011 16:10:34  Job #:  [id number]

## 2011-07-10 ENCOUNTER — Ambulatory Visit: Payer: 59 | Admitting: Physical Medicine and Rehabilitation

## 2011-07-17 ENCOUNTER — Other Ambulatory Visit: Payer: Self-pay | Admitting: Physical Medicine and Rehabilitation

## 2011-07-17 DIAGNOSIS — M545 Low back pain, unspecified: Secondary | ICD-10-CM

## 2011-07-17 DIAGNOSIS — M79604 Pain in right leg: Secondary | ICD-10-CM

## 2011-07-29 ENCOUNTER — Ambulatory Visit (HOSPITAL_COMMUNITY)
Admission: RE | Admit: 2011-07-29 | Discharge: 2011-07-29 | Disposition: A | Discharge status: Home / Self Care | Payer: 59 | Source: Ambulatory Visit | Attending: Physical Medicine and Rehabilitation | Admitting: Physical Medicine and Rehabilitation

## 2011-07-29 DIAGNOSIS — M25569 Pain in unspecified knee: Secondary | ICD-10-CM | POA: Insufficient documentation

## 2011-07-29 DIAGNOSIS — M545 Low back pain, unspecified: Secondary | ICD-10-CM

## 2011-07-29 DIAGNOSIS — D1809 Hemangioma of other sites: Secondary | ICD-10-CM | POA: Insufficient documentation

## 2011-07-29 DIAGNOSIS — M79604 Pain in right leg: Secondary | ICD-10-CM

## 2011-07-29 DIAGNOSIS — M79609 Pain in unspecified limb: Secondary | ICD-10-CM | POA: Insufficient documentation

## 2011-07-29 DIAGNOSIS — Q762 Congenital spondylolisthesis: Secondary | ICD-10-CM | POA: Insufficient documentation

## 2011-07-29 DIAGNOSIS — R109 Unspecified abdominal pain: Secondary | ICD-10-CM | POA: Insufficient documentation

## 2011-07-29 DIAGNOSIS — M51379 Other intervertebral disc degeneration, lumbosacral region without mention of lumbar back pain or lower extremity pain: Secondary | ICD-10-CM | POA: Insufficient documentation

## 2011-07-29 DIAGNOSIS — M5137 Other intervertebral disc degeneration, lumbosacral region: Secondary | ICD-10-CM | POA: Insufficient documentation

## 2011-08-05 ENCOUNTER — Encounter: Payer: 59 | Attending: Physical Medicine and Rehabilitation | Admitting: Physical Medicine and Rehabilitation

## 2011-08-05 DIAGNOSIS — M545 Low back pain, unspecified: Secondary | ICD-10-CM

## 2011-08-05 DIAGNOSIS — M79609 Pain in unspecified limb: Secondary | ICD-10-CM

## 2011-08-05 DIAGNOSIS — Q762 Congenital spondylolisthesis: Secondary | ICD-10-CM | POA: Insufficient documentation

## 2011-08-05 DIAGNOSIS — M4310 Spondylolisthesis, site unspecified: Secondary | ICD-10-CM

## 2011-08-05 DIAGNOSIS — G894 Chronic pain syndrome: Secondary | ICD-10-CM
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
## 2011-09-01 ENCOUNTER — Encounter: Payer: 59 | Attending: Neurosurgery | Admitting: Neurosurgery

## 2011-09-01 DIAGNOSIS — Q762 Congenital spondylolisthesis: Secondary | ICD-10-CM

## 2011-09-01 DIAGNOSIS — M25569 Pain in unspecified knee: Secondary | ICD-10-CM | POA: Insufficient documentation

## 2011-09-01 DIAGNOSIS — G894 Chronic pain syndrome: Secondary | ICD-10-CM

## 2011-09-01 DIAGNOSIS — G43909 Migraine, unspecified, not intractable, without status migrainosus: Secondary | ICD-10-CM | POA: Insufficient documentation

## 2011-09-01 DIAGNOSIS — M545 Low back pain, unspecified: Secondary | ICD-10-CM | POA: Insufficient documentation

## 2011-09-01 DIAGNOSIS — M129 Arthropathy, unspecified: Secondary | ICD-10-CM | POA: Insufficient documentation

## 2011-09-01 DIAGNOSIS — M1710 Unilateral primary osteoarthritis, unspecified knee: Secondary | ICD-10-CM

## 2011-09-01 DIAGNOSIS — M79609 Pain in unspecified limb: Secondary | ICD-10-CM | POA: Insufficient documentation
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
## 2011-10-08 ENCOUNTER — Other Ambulatory Visit: Payer: Self-pay | Admitting: Internal Medicine

## 2011-10-08 ENCOUNTER — Encounter: Payer: 59 | Attending: Neurosurgery | Admitting: Neurosurgery

## 2011-10-08 DIAGNOSIS — M545 Low back pain, unspecified: Secondary | ICD-10-CM | POA: Insufficient documentation

## 2011-10-08 DIAGNOSIS — M25569 Pain in unspecified knee: Secondary | ICD-10-CM | POA: Insufficient documentation

## 2011-10-08 DIAGNOSIS — G43909 Migraine, unspecified, not intractable, without status migrainosus: Secondary | ICD-10-CM | POA: Insufficient documentation

## 2011-10-08 DIAGNOSIS — Q762 Congenital spondylolisthesis: Secondary | ICD-10-CM | POA: Insufficient documentation

## 2011-10-08 DIAGNOSIS — M129 Arthropathy, unspecified: Secondary | ICD-10-CM | POA: Insufficient documentation

## 2011-10-08 DIAGNOSIS — M79609 Pain in unspecified limb: Secondary | ICD-10-CM | POA: Insufficient documentation

## 2011-10-09 ENCOUNTER — Telehealth: Payer: Self-pay | Admitting: *Deleted

## 2011-10-09 NOTE — Telephone Encounter (Signed)
Rf req for Zolpidem 10 mg 1/2-1 po qhs prn. # 90. Last filled 09-01-11. Ok to Rf?

## 2011-10-09 NOTE — Telephone Encounter (Signed)
OK to fill this prescription with additional refills x2 Thank you!  

## 2011-10-09 NOTE — Assessment & Plan Note (Signed)
This patient of Dr. Pamelia Hoit seen for continued right-sided low back pain and right knee pain.  She reports pain unchanged at an 8.  It is a dull, tingling and aching pain.  General activity level is 8.  Pain is worse during the day and evening.  Pain is worse with walking, standing and activity.  Rest and medication helps.  She can climb steps and drive.  She can walk 30 minutes at a time.  She works full time at an office.  REVIEW OF SYSTEMS:  Notable for the difficulties described above otherwise unremarkable.  No aberrant behavior or suicidal thoughts. Last pill count and UDS consistent.  PAST MEDICAL HISTORY, SOCIAL HISTORY AND FAMILY HISTORY:  Unchanged.  PHYSICAL EXAMINATION:  VITAL SIGNS:  Blood pressure is 120/76, pulse 93, respirations 14 and O2 sats 96 on room air.  EXTREMITIES:  Motor strength and sensation are intact in the upper and lower extremities. NEUROLOGIC:  Constitutionally, she is thin.  She is alert and oriented x3.  Her affect is bright and alert.  Her gait is normal.  ASSESSMENT: 1. History of grade 1 L4-5 spondylolisthesis with facet arthropathy. 2. Right lower extremity/knee pain 3. History of migraine.  PLAN: 1. Refill hydrocodone 7.5/325 one p.o. q.i.d. p.r.n. 120 with no     refill. 2. Gabapentin 300 mg one 5 times a day and 2 at night, 210 with 3     refills 3. Tramadol 50 mg one p.o. b.i.d. p.r.n. 60 with 3 refills.  Her     questions were encouraged and answered.  Dr. Pamelia Hoit will see     her next month.     Rider Ermis L. Blima Dessert Electronically Signed    RLW/MedQ D:  10/08/2011 08:46:25  T:  10/09/2011 01:49:31  Job #:  [id number]

## 2011-10-12 MED ORDER — ZOLPIDEM TARTRATE 10 MG PO TABS: 5.0000 mg | ORAL_TABLET | Freq: Every evening | ORAL | Status: DC | PRN

## 2011-10-19 ENCOUNTER — Telehealth: Payer: Self-pay | Admitting: *Deleted

## 2011-10-19 MED ORDER — ALPRAZOLAM 0.5 MG PO TABS: 0.5000 mg | ORAL_TABLET | Freq: Two times a day (BID) | ORAL | Status: DC | PRN

## 2011-10-19 NOTE — Telephone Encounter (Signed)
Rf req for Alprazolam 0.5mg .  ok to Rf?

## 2011-10-19 NOTE — Telephone Encounter (Signed)
OK to fill this prescription with additional refills x2 Thank you!  

## 2011-11-04 ENCOUNTER — Encounter: Payer: 59 | Attending: Physical Medicine and Rehabilitation | Admitting: Physical Medicine and Rehabilitation

## 2011-11-04 DIAGNOSIS — G8929 Other chronic pain: Secondary | ICD-10-CM | POA: Insufficient documentation

## 2011-11-04 DIAGNOSIS — M545 Low back pain, unspecified: Secondary | ICD-10-CM

## 2011-11-04 DIAGNOSIS — G894 Chronic pain syndrome: Secondary | ICD-10-CM

## 2011-11-04 DIAGNOSIS — M48061 Spinal stenosis, lumbar region without neurogenic claudication: Secondary | ICD-10-CM

## 2011-11-04 DIAGNOSIS — M79609 Pain in unspecified limb: Secondary | ICD-10-CM

## 2011-11-04 DIAGNOSIS — Q762 Congenital spondylolisthesis: Secondary | ICD-10-CM | POA: Insufficient documentation

## 2011-11-04 DIAGNOSIS — M129 Arthropathy, unspecified: Secondary | ICD-10-CM | POA: Insufficient documentation

## 2011-11-04 DIAGNOSIS — G43909 Migraine, unspecified, not intractable, without status migrainosus: Secondary | ICD-10-CM | POA: Insufficient documentation

## 2011-11-04 DIAGNOSIS — X58XXXS Exposure to other specified factors, sequela: Secondary | ICD-10-CM | POA: Insufficient documentation

## 2011-11-04 DIAGNOSIS — R209 Unspecified disturbances of skin sensation: Secondary | ICD-10-CM | POA: Insufficient documentation

## 2011-11-04 DIAGNOSIS — M2170 Unequal limb length (acquired), unspecified site: Secondary | ICD-10-CM | POA: Insufficient documentation

## 2011-11-04 DIAGNOSIS — S8290XS Unspecified fracture of unspecified lower leg, sequela: Secondary | ICD-10-CM | POA: Insufficient documentation

## 2011-11-05 NOTE — Assessment & Plan Note (Signed)
Vanessa Payne is a very pleasant 41 year old woman, who works 65-70 hours a week.  [facility] to [facility] for refill of her pain medications.  She continues to have average pain about 8 on a scale of 10, interfering significantly with activity levels if she is not on medication.  Her pain is described as dull, tingling and aching in nature, predominantly in the right low back, radiating to the right lower extremity.  She has known L4 on L5 spondylolisthesis with moderate neural foraminal narrowing at that L4-L5 level.  She has some neurogenic pain in the right lower extremity intermittently.  She also has a history of right femur fracture remotely, calcaneal fracture with decreased range of motion in her right foot as well as a short leg on that side secondary to fracture.  She also has a history of migraines, this is managed by Dr. Posey Rea, and she had some recent left shoulder surgery by Dr. Ranell Patrick on June 27, 2010.  FUNCTIONAL STATUS:  Generally high-functioning woman, working 65-70 hours a week, independent with self-care.  She reports no new problems with respect to review of systems.  PAST MEDICAL HISTORY:  Unchanged.  SOCIAL HISTORY:  Unchanged.  FAMILY HISTORY:  Unchanged.  PHYSICAL EXAMINATION:  VITAL SIGNS:  Her blood pressure is 113/69, pulse 119, respirations 16, 99% saturated on room air. GENERAL:  She is a well-developed, well-nourished woman, who does not appear in any distress.  She is oriented x3.  She is alert, cooperative, and pleasant.  Follows commands without difficulty, answers my questions appropriately. NEUROLOGIC:  Cranial nerves, coordination are grossly intact.  Reflexes are diminished in the lower extremities, especially on the right, 1+ in the left lower extremity.  No abnormal tone, clonus, or tremors are noted.  Motor strength is good in both lower extremities.  5/5 at hip flexors, knee extensors,  dorsiflexors, and plantar flexors.  She has some limited ankle mobility on the right.  Right short leg is again noted.  She has a built-up shoe on that side as well.  She transitions easily from sitting to standing.  Her gait is slightly uneven due to the leg length discrepancy.  She has some increased pain when she extends from a forward flexed position regarding her lumbar spine.  She has patchy numbness in the right lower extremity, which is unchanged from previous visit.  Urine drug screen was reviewed, is generally consistent.  IMPRESSION: 1. Grade 1 L4 on L5 spondylolisthesis with facet arthropathy and     moderate neural foraminal narrowing bilaterally with intermittent     right lower extremity pain and chronic low back pain. 2. Right lower extremity chronic pain, multifactorial in nature,     history of right femur fracture remotely, calcaneal fracture,     decreased ankle range of motion, persistent patchy numbness in the     right lower extremity and significant leg length discrepancy     requiring built-up shoe on the right. 3. History of migraines managed by Dr. Posey Rea. 4. Status post left shoulder surgery, Dr. Ranell Patrick on 06/27/2010.  PLAN:  She requests refill of her pain medications today.  I spent approximately half an hour with her discussing her medications with her. We discussed consideration of decreasing some of her pain medications when she has been relatively stable regarding her pain.  She is somewhat reluctant to consider this currently.  She feels pressure regarding her job to maintain current levels of function and is concerned  any decrease in her medications would result in decreased function.  She is considering reducing the amount of Flexeril she takes.  She has typically been using about 2 a day.  I have answered all her questions at this time.  She did sign a narcotic consent.  I asked her if she had any questions regarding that and she stated she  understood.  I will continue to monitor her pain medication usage.  She has been taking her medications as prescribed without aberrant behavior.  Her last urine drug screen was consistent.  We will refill her hydrocodone 7.5/325, not more than 4 per day.  She does not need refills on her tramadol or Neurontin.  I will see her back in 1 month.     Brantley Stage, M.D.    DMK/MedQ D:  11/04/2011 09:30:21  T:  11/05/2011 00:09:41  Job #:  [id number]

## 2011-11-11 ENCOUNTER — Other Ambulatory Visit: Payer: Self-pay | Admitting: Internal Medicine

## 2011-11-13 ENCOUNTER — Telehealth: Payer: Self-pay | Admitting: *Deleted

## 2011-11-13 NOTE — Telephone Encounter (Signed)
Rf req for gen Fioricet 1-2 po bid prn # 60. Ok to rf?

## 2011-11-16 ENCOUNTER — Telehealth: Payer: Self-pay | Admitting: *Deleted

## 2011-11-16 MED ORDER — BUTALBITAL-APAP-CAFFEINE 50-325-40 MG PO TABS: 1.0000 | ORAL_TABLET | Freq: Two times a day (BID) | ORAL | Status: DC | PRN

## 2011-11-16 NOTE — Telephone Encounter (Signed)
See other note - duplicated Thx

## 2011-11-16 NOTE — Telephone Encounter (Signed)
OK to fill this prescription with additional refills x1. Sch OV pls Thank you!  

## 2011-11-16 NOTE — Telephone Encounter (Signed)
Rf req for gen fioricet 1-2 po bid prn. # 60. Last filled 1.19.13. Ok to RF?

## 2011-11-17 NOTE — Telephone Encounter (Signed)
Rf sent

## 2011-11-21 ENCOUNTER — Telehealth: Payer: Self-pay | Admitting: *Deleted

## 2011-11-21 NOTE — Telephone Encounter (Signed)
Rf was sent on 11/16/11.

## 2011-11-21 NOTE — Telephone Encounter (Signed)
Message copied by Merrilyn Puma on Sat Nov 21, 2011 11:57 AM ------      Message from: Etheleen Sia      Created: Tue Nov 17, 2011 10:20 AM       PT MADE AN APPT FOR April 23.  SHE WOULD LIKE HER FIROCET CALLED IN.  SHE IS AWARE SHE HAS A LAB ORDER IN THE SYSTEM TO DO PRIOR.            ----- Message -----         From: Lanier Prude, CMA         Sent: 11/16/2011   5:18 PM           To: Epimenio Sarin, #            Please sched OV with PCP if she doesn't already have one per AVP. Thanks!!!

## 2011-12-02 ENCOUNTER — Encounter: Payer: Self-pay | Admitting: *Deleted

## 2011-12-02 ENCOUNTER — Encounter: Payer: 59 | Attending: Physical Medicine and Rehabilitation | Admitting: *Deleted

## 2011-12-02 VITALS — BP 123/62 | HR 88 | Resp 18 | Ht 64.0 in | Wt 126.0 lb

## 2011-12-02 DIAGNOSIS — R209 Unspecified disturbances of skin sensation: Secondary | ICD-10-CM | POA: Insufficient documentation

## 2011-12-02 DIAGNOSIS — M79609 Pain in unspecified limb: Secondary | ICD-10-CM | POA: Insufficient documentation

## 2011-12-02 DIAGNOSIS — G8929 Other chronic pain: Secondary | ICD-10-CM | POA: Insufficient documentation

## 2011-12-02 DIAGNOSIS — M545 Low back pain, unspecified: Secondary | ICD-10-CM | POA: Insufficient documentation

## 2011-12-02 DIAGNOSIS — IMO0002 Reserved for concepts with insufficient information to code with codable children: Secondary | ICD-10-CM

## 2011-12-02 DIAGNOSIS — G894 Chronic pain syndrome: Secondary | ICD-10-CM

## 2011-12-02 DIAGNOSIS — M25569 Pain in unspecified knee: Secondary | ICD-10-CM

## 2011-12-02 DIAGNOSIS — M129 Arthropathy, unspecified: Secondary | ICD-10-CM | POA: Insufficient documentation

## 2011-12-02 DIAGNOSIS — M2170 Unequal limb length (acquired), unspecified site: Secondary | ICD-10-CM | POA: Insufficient documentation

## 2011-12-02 DIAGNOSIS — Q762 Congenital spondylolisthesis: Secondary | ICD-10-CM | POA: Insufficient documentation

## 2011-12-02 MED ORDER — HYDROCODONE-ACETAMINOPHEN 7.5-325 MG PO TABS: 1.0000 | ORAL_TABLET | Freq: Four times a day (QID) | ORAL | Status: DC | PRN

## 2011-12-02 NOTE — Progress Notes (Signed)
Reports pain seems to be worse but not affecting ADL's. "I just push through it". Denies recent falls

## 2011-12-07 ENCOUNTER — Ambulatory Visit: Payer: 59 | Admitting: Physical Medicine and Rehabilitation

## 2011-12-08 ENCOUNTER — Telehealth: Payer: Self-pay | Admitting: *Deleted

## 2011-12-08 ENCOUNTER — Other Ambulatory Visit: Payer: Self-pay | Admitting: Internal Medicine

## 2011-12-08 NOTE — Telephone Encounter (Signed)
error 

## 2011-12-16 ENCOUNTER — Other Ambulatory Visit: Payer: Self-pay | Admitting: Internal Medicine

## 2011-12-28 ENCOUNTER — Encounter: Payer: 59 | Attending: Physical Medicine and Rehabilitation | Admitting: *Deleted

## 2011-12-28 ENCOUNTER — Encounter: Payer: Self-pay | Admitting: *Deleted

## 2011-12-28 VITALS — BP 120/76 | HR 82 | Resp 18 | Ht 65.0 in | Wt 122.0 lb

## 2011-12-28 DIAGNOSIS — R209 Unspecified disturbances of skin sensation: Secondary | ICD-10-CM | POA: Insufficient documentation

## 2011-12-28 DIAGNOSIS — G894 Chronic pain syndrome: Secondary | ICD-10-CM

## 2011-12-28 DIAGNOSIS — Q762 Congenital spondylolisthesis: Secondary | ICD-10-CM | POA: Insufficient documentation

## 2011-12-28 DIAGNOSIS — M545 Low back pain, unspecified: Secondary | ICD-10-CM

## 2011-12-28 DIAGNOSIS — M79609 Pain in unspecified limb: Secondary | ICD-10-CM | POA: Insufficient documentation

## 2011-12-28 DIAGNOSIS — M2170 Unequal limb length (acquired), unspecified site: Secondary | ICD-10-CM | POA: Insufficient documentation

## 2011-12-28 DIAGNOSIS — G8929 Other chronic pain: Secondary | ICD-10-CM | POA: Insufficient documentation

## 2011-12-28 MED ORDER — HYDROCODONE-ACETAMINOPHEN 7.5-325 MG PO TABS: 1.0000 | ORAL_TABLET | Freq: Four times a day (QID) | ORAL | Status: DC | PRN

## 2011-12-28 MED ORDER — GABAPENTIN 300 MG PO CAPS: 2100.0000 mg | ORAL_CAPSULE | Freq: Every day | ORAL | Status: DC

## 2011-12-28 MED ORDER — TRAMADOL HCL 50 MG PO TABS: 50.0000 mg | ORAL_TABLET | Freq: Two times a day (BID) | ORAL | Status: DC | PRN

## 2011-12-28 NOTE — Progress Notes (Signed)
Reports no change to pain complaint. No questions voiced for MD.

## 2011-12-30 ENCOUNTER — Other Ambulatory Visit: Payer: Self-pay | Admitting: Internal Medicine

## 2012-01-05 NOTE — Telephone Encounter (Signed)
Pt called requesting refill of Fioricet, 1 mth supply.

## 2012-01-06 ENCOUNTER — Other Ambulatory Visit: Payer: Self-pay | Admitting: *Deleted

## 2012-01-06 MED ORDER — BUTALBITAL-APAP-CAFFEINE 50-325-40 MG PO TABS: 1.0000 | ORAL_TABLET | Freq: Two times a day (BID) | ORAL | Status: DC | PRN

## 2012-01-20 ENCOUNTER — Other Ambulatory Visit: Payer: Self-pay | Admitting: Internal Medicine

## 2012-01-27 ENCOUNTER — Other Ambulatory Visit (INDEPENDENT_AMBULATORY_CARE_PROVIDER_SITE_OTHER): Payer: 59

## 2012-01-27 DIAGNOSIS — E559 Vitamin D deficiency, unspecified: Secondary | ICD-10-CM

## 2012-01-27 DIAGNOSIS — Z0000 Encounter for general adult medical examination without abnormal findings: Secondary | ICD-10-CM

## 2012-01-27 DIAGNOSIS — E538 Deficiency of other specified B group vitamins: Secondary | ICD-10-CM

## 2012-01-27 LAB — COMPREHENSIVE METABOLIC PANEL
ALT: 12 U/L (ref 0–35)
AST: 19 U/L (ref 0–37)
Albumin: 3.7 g/dL (ref 3.5–5.2)
Alkaline Phosphatase: 125 U/L — ABNORMAL HIGH (ref 39–117)
BUN: 5 mg/dL — ABNORMAL LOW (ref 6–23)
CO2: 22 mEq/L (ref 19–32)
Calcium: 9.1 mg/dL (ref 8.4–10.5)
Chloride: 110 mEq/L (ref 96–112)
Creatinine, Ser: 0.8 mg/dL (ref 0.4–1.2)
GFR: 83.98 mL/min (ref >60.00)
Glucose, Bld: 68 mg/dL — ABNORMAL LOW (ref 70–99)
Potassium: 4.9 mEq/L (ref 3.5–5.1)
Sodium: 140 mEq/L (ref 135–145)
Total Bilirubin: 0.2 mg/dL — ABNORMAL LOW (ref 0.3–1.2)
Total Protein: 7.7 g/dL (ref 6.0–8.3)

## 2012-01-27 LAB — CBC WITH DIFFERENTIAL/PLATELET
Basophils Absolute: 0.1 10*3/uL (ref 0.0–0.1)
Basophils Relative: 0.5 % (ref 0.0–3.0)
Eosinophils Absolute: 0 10*3/uL (ref 0.0–0.7)
Eosinophils Relative: 0.1 % (ref 0.0–5.0)
HCT: 39.3 % (ref 36.0–46.0)
Hemoglobin: 12.8 g/dL (ref 12.0–15.0)
Lymphocytes Relative: 16.4 % (ref 12.0–46.0)
Lymphs Abs: 2 10*3/uL (ref 0.7–4.0)
MCHC: 32.6 g/dL (ref 30.0–36.0)
MCV: 96 fl (ref 78.0–100.0)
Monocytes Absolute: 0.7 10*3/uL (ref 0.1–1.0)
Monocytes Relative: 6 % (ref 3.0–12.0)
Neutro Abs: 9.6 10*3/uL — ABNORMAL HIGH (ref 1.4–7.7)
Neutrophils Relative %: 77 % (ref 43.0–77.0)
Platelets: 387 10*3/uL (ref 150.0–400.0)
RBC: 4.09 Mil/uL (ref 3.87–5.11)
RDW: 13.5 % (ref 11.5–14.6)
WBC: 12.4 10*3/uL — ABNORMAL HIGH (ref 4.5–10.5)

## 2012-01-27 LAB — URINALYSIS, ROUTINE W REFLEX MICROSCOPIC
Bilirubin Urine: NEGATIVE
Ketones, ur: NEGATIVE
Leukocytes, UA: NEGATIVE
Nitrite: NEGATIVE
Specific Gravity, Urine: 1.02 (ref 1.000–1.030)
Total Protein, Urine: NEGATIVE
Urine Glucose: NEGATIVE
Urobilinogen, UA: 0.2 (ref 0.0–1.0)
pH: 6 (ref 5.0–8.0)

## 2012-01-27 LAB — LIPID PANEL
Cholesterol: 203 mg/dL — ABNORMAL HIGH (ref 0–200)
HDL: 37.2 mg/dL — ABNORMAL LOW (ref >39.00)
Total CHOL/HDL Ratio: 5
Triglycerides: 183 mg/dL — ABNORMAL HIGH (ref 0.0–149.0)
VLDL: 36.6 mg/dL (ref 0.0–40.0)

## 2012-01-27 LAB — TSH: TSH: 0.97 u[IU]/mL (ref 0.35–5.50)

## 2012-01-27 LAB — VITAMIN B12: Vitamin B-12: 437 pg/mL (ref 211–911)

## 2012-01-27 LAB — LDL CHOLESTEROL, DIRECT: Direct LDL: 137.9 mg/dL

## 2012-01-27 LAB — SEDIMENTATION RATE: Sed Rate: 40 mm/hr — ABNORMAL HIGH (ref 0–22)

## 2012-01-28 ENCOUNTER — Telehealth: Payer: Self-pay | Admitting: Internal Medicine

## 2012-01-28 LAB — VITAMIN D 25 HYDROXY (VIT D DEFICIENCY, FRACTURES): Vit D, 25-Hydroxy: 24 ng/mL — ABNORMAL LOW (ref 30–89)

## 2012-01-28 MED ORDER — ERGOCALCIFEROL 1.25 MG (50000 UT) PO CAPS: 50000.0000 [IU] | ORAL_CAPSULE | ORAL | Status: DC

## 2012-01-28 NOTE — Telephone Encounter (Signed)
Vanessa Payne, please, inform patient that her vit D is low - take rx x 6 wks, then otc daily Keep ROV Thx

## 2012-01-28 NOTE — Telephone Encounter (Signed)
Pt informed

## 2012-02-02 ENCOUNTER — Ambulatory Visit (INDEPENDENT_AMBULATORY_CARE_PROVIDER_SITE_OTHER): Payer: 59 | Admitting: Internal Medicine

## 2012-02-02 ENCOUNTER — Encounter: Payer: Self-pay | Admitting: Internal Medicine

## 2012-02-02 VITALS — BP 96/70 | HR 108 | Temp 98.3°F | Resp 16 | Wt 125.0 lb

## 2012-02-02 DIAGNOSIS — M545 Low back pain, unspecified: Secondary | ICD-10-CM

## 2012-02-02 DIAGNOSIS — F411 Generalized anxiety disorder: Secondary | ICD-10-CM

## 2012-02-02 MED ORDER — "ANTI-STICK INSULIN SYRINGE 29G X 1/2"" 1 ML MISC": Status: DC

## 2012-02-02 MED ORDER — ZOLPIDEM TARTRATE 10 MG PO TABS: 5.0000 mg | ORAL_TABLET | Freq: Every evening | ORAL | Status: DC | PRN

## 2012-02-02 MED ORDER — CYANOCOBALAMIN 1000 MCG/ML IJ SOLN: 1000.0000 ug | INTRAMUSCULAR | Status: DC

## 2012-02-02 MED ORDER — DICLOFENAC SODIUM 1 % TD GEL: 1.0000 "application " | Freq: Four times a day (QID) | TRANSDERMAL | Status: DC

## 2012-02-02 MED ORDER — ALBUTEROL SULFATE HFA 108 (90 BASE) MCG/ACT IN AERS: 2.0000 | INHALATION_SPRAY | Freq: Four times a day (QID) | RESPIRATORY_TRACT | Status: DC

## 2012-02-02 MED ORDER — ALPRAZOLAM 0.5 MG PO TABS: 0.5000 mg | ORAL_TABLET | Freq: Two times a day (BID) | ORAL | Status: DC | PRN

## 2012-02-02 MED ORDER — TOPIRAMATE 100 MG PO TABS: 100.0000 mg | ORAL_TABLET | Freq: Every day | ORAL | Status: DC

## 2012-02-02 MED ORDER — VENLAFAXINE HCL 75 MG PO TABS: 225.0000 mg | ORAL_TABLET | Freq: Every morning | ORAL | Status: DC

## 2012-02-02 MED ORDER — OMEPRAZOLE 40 MG PO CPDR: 40.0000 mg | DELAYED_RELEASE_CAPSULE | Freq: Every day | ORAL | Status: DC

## 2012-02-02 MED ORDER — CYCLOBENZAPRINE HCL 10 MG PO TABS: 10.0000 mg | ORAL_TABLET | Freq: Two times a day (BID) | ORAL | Status: DC | PRN

## 2012-02-02 MED ORDER — BUTALBITAL-APAP-CAFFEINE 50-325-40 MG PO TABS: 1.0000 | ORAL_TABLET | Freq: Four times a day (QID) | ORAL | Status: DC | PRN

## 2012-02-02 NOTE — Assessment & Plan Note (Signed)
Continue with current prescription therapy as reflected on the Med list.  

## 2012-02-02 NOTE — Progress Notes (Signed)
Patient ID: Vanessa Payne, female   DOB: [birth year], 41 y.o.   MRN: [id number]  Subjective:    Patient ID: Vanessa Payne, female    DOB: [birth year], 41 y.o.   MRN: [id number]  HPI   . The patient is here to follow up on chronic depression, anxiety, headaches and chronic moderate fibromyalgia symptoms controlled with medicines, diet and exercise.  Working 75+h per week  BP Readings from Last 3 Encounters:  02/02/12 96/70  12/28/11 120/76  12/02/11 123/62   Wt Readings from Last 3 Encounters:  02/02/12 125 lb (56.7 kg)  12/28/11 122 lb (55.339 kg)  12/02/11 126 lb (57.153 kg)     Review of Systems  Constitutional: Negative for chills, activity change, appetite change, fatigue and unexpected weight change.  HENT: Negative for congestion, mouth sores and sinus pressure.   Eyes: Negative for visual disturbance.  Respiratory: Negative for cough and chest tightness.   Gastrointestinal: Negative for nausea and abdominal pain.  Genitourinary: Negative for frequency, difficulty urinating and vaginal pain.  Musculoskeletal: Positive for myalgias, arthralgias and gait problem. Negative for back pain.  Skin: Negative for pallor and rash.  Neurological: Negative for dizziness, tremors, weakness, numbness and headaches.  Psychiatric/Behavioral: Positive for sleep disturbance and dysphoric mood. Negative for suicidal ideas, hallucinations, confusion, self-injury and agitation. The patient is nervous/anxious. The patient is not hyperactive.        Depressed, stressed       Objective:   Physical Exam  Constitutional: She appears well-developed and well-nourished. No distress.  HENT:  Head: Normocephalic.  Right Ear: External ear normal.  Left Ear: External ear normal.  Nose: Nose normal.  Mouth/Throat: Oropharynx is clear and moist.  Eyes: Conjunctivae are normal. Pupils are equal, round, and reactive to light. Right eye exhibits no discharge. Left eye exhibits no discharge.  Neck:  Normal range of motion. Neck supple. No JVD present. No tracheal deviation present. No thyromegaly present.  Cardiovascular: Normal rate, regular rhythm and normal heart sounds.   Pulmonary/Chest: No stridor. No respiratory distress. She has no wheezes.  Abdominal: Soft. Bowel sounds are normal. She exhibits no distension and no mass. There is no tenderness. There is no rebound and no guarding.  Musculoskeletal: She exhibits no edema and no tenderness.  Lymphadenopathy:    She has no cervical adenopathy.  Neurological: She displays normal reflexes. No cranial nerve deficit. She exhibits normal muscle tone. Coordination normal.  Skin: No rash noted. No erythema.  Psychiatric: Her behavior is normal. Judgment and thought content normal.   Lab Results  Component Value Date   WBC 12.4* 01/27/2012   HGB 12.8 01/27/2012   HCT 39.3 01/27/2012   PLT 387.0 01/27/2012   GLUCOSE 68* 01/27/2012   CHOL 203* 01/27/2012   TRIG 183.0* 01/27/2012   HDL 37.20* 01/27/2012   LDLDIRECT 137.9 01/27/2012   ALT 12 01/27/2012   AST 19 01/27/2012   NA 140 01/27/2012   K 4.9 01/27/2012   CL 110 01/27/2012   CREATININE 0.8 01/27/2012   BUN 5* 01/27/2012   CO2 22 01/27/2012   TSH 0.97 01/27/2012          Assessment & Plan:

## 2012-02-05 ENCOUNTER — Encounter: Payer: Self-pay | Admitting: Physical Medicine and Rehabilitation

## 2012-02-05 ENCOUNTER — Encounter: Payer: 59 | Attending: Physical Medicine and Rehabilitation | Admitting: Physical Medicine and Rehabilitation

## 2012-02-05 VITALS — BP 119/69 | HR 105 | Resp 14 | Ht 65.0 in | Wt 122.0 lb

## 2012-02-05 DIAGNOSIS — M545 Low back pain, unspecified: Secondary | ICD-10-CM

## 2012-02-05 DIAGNOSIS — R200 Anesthesia of skin: Secondary | ICD-10-CM

## 2012-02-05 DIAGNOSIS — M79609 Pain in unspecified limb: Secondary | ICD-10-CM

## 2012-02-05 DIAGNOSIS — M129 Arthropathy, unspecified: Secondary | ICD-10-CM | POA: Insufficient documentation

## 2012-02-05 DIAGNOSIS — Q762 Congenital spondylolisthesis: Secondary | ICD-10-CM | POA: Insufficient documentation

## 2012-02-05 DIAGNOSIS — M2170 Unequal limb length (acquired), unspecified site: Secondary | ICD-10-CM | POA: Insufficient documentation

## 2012-02-05 DIAGNOSIS — G8929 Other chronic pain: Secondary | ICD-10-CM | POA: Insufficient documentation

## 2012-02-05 DIAGNOSIS — R209 Unspecified disturbances of skin sensation: Secondary | ICD-10-CM

## 2012-02-05 MED ORDER — HYDROCODONE-ACETAMINOPHEN 7.5-325 MG PO TABS: 1.0000 | ORAL_TABLET | Freq: Four times a day (QID) | ORAL | Status: DC | PRN

## 2012-02-05 NOTE — Progress Notes (Signed)
Subjective:    Patient ID: Vanessa Payne, female    DOB: [birth year], 41 y.o.   MRN: [id number]  HPI   [name] is a very pleasant 41 year old woman, who works 65-70 hours a  week. She has returned to [facility]  for refill of her pain medications. She continues to have average pain  about 8 on a scale of 10, interfering significantly with activity levels  if she is not on medication. Her pain is described as dull, tingling  and aching in nature, predominantly in the right low back, radiating to  the right lower extremity.  She has known L4 on L5 spondylolisthesis with moderate neural foraminal  narrowing at that L4-L5 level. She has some neurogenic pain in the  right lower extremity intermittently. She also has a history of right  femur fracture remotely, calcaneal fracture with decreased range of  motion in her right foot as well as a short leg on that side secondary  to fracture. She also has a history of migraines, this is managed by  Dr. Posey Rea, and she had some recent left shoulder surgery by Dr.  Ranell Patrick on June 27, 2010.   Dr. Posey Rea fills her fioricet which she has been takeing for about 10 years.  She is taking flexeril about 5 times per month.   New complaint: bilat hand numbness median distribution up to 12 times per month esp am and aat night..  ON computer a lot at work and drives a lot as well.   Pain Inventory Average Pain 8 Pain Right Now 8 My pain is dull, tingling and aching  In the last 24 hours, has pain interfered with the following? General activity 8 Relation with others 8 Enjoyment of life 8 What TIME of day is your pain at its worst? evening Sleep (in general) Poor  Pain is worse with: walking, standing and some activites Pain improves with: rest and medication Relief from Meds: 5  Mobility walk without assistance how many minutes can you walk? 30 ability to climb steps?  yes do you drive?   yes  Function employed # of hrs/week 65-70  Neuro/Psych No problems in this area  Prior Studies Any changes since last visit?  no  Physicians involved in your care Any changes since last visit?  no  Review of Systems  Constitutional: Negative.   HENT: Negative.   Eyes: Negative.   Respiratory: Negative.   Cardiovascular: Negative.   Gastrointestinal: Negative.   Genitourinary: Negative.   Musculoskeletal: Negative.   Skin: Negative.   Neurological: Negative.   Hematological: Negative.   Psychiatric/Behavioral: Negative.        Objective:   Physical Exam She is a well-developed, well-nourished woman, who does not  appear in any distress. She is oriented x3. She is alert, cooperative,  and pleasant. Follows commands without difficulty, answers my questions  appropriately.    NEUROLOGIC: Cranial nerves, coordination are grossly intact.    Reflexes are diminished in the lower extremities, especially on the right, 1+ in  the left lower extremity. No abnormal tone, clonus, or tremors are  noted.   Motor strength is good in both lower extremities. 5/5 at hip  flexors, knee extensors, dorsiflexors, and plantar flexors.   She has  some limited ankle mobility on the right. Right short leg is again  noted. She has a built-up shoe on that side as well.  She transitions easily from sitting to standing. Her gait is slightly  uneven due to the leg length discrepancy.   She has some increased pain  when she extends from a forward flexed position regarding her lumbar  spine.   She has patchy numbness in the right lower extremity, which is  unchanged from previous visit.    Upper extremity reflexes intact with good strength in arms and hands.  Decreased pp and LT in thumb index and middle finger. bilat      Assessment & Plan:  1. Grade 1 L4 on L5 spondylolisthesis with facet arthropathy and  moderate neural foraminal narrowing bilaterally with intermittent  right  lower extremity pain and chronic low back pain.    2. Right lower extremity chronic pain, multifactorial in nature,  history of right femur fracture remotely, calcaneal fracture,  decreased ankle range of motion, persistent patchy numbness in the  right lower extremity and significant leg length discrepancy  requiring built-up shoe on the right.    3. History of migraines managed by Dr. Posey Rea.    4. Status post left shoulder surgery, Dr. Ranell Patrick on 06/27/2010.   5. New hand numbness and tingling:  Will trial night spliints, consider Electrodx studies if not improving with splints   She requests refill of her pain medications today. I spent  approximately half an hour with her discussing her medications with her.  We discussed consideration of decreasing some of her pain medications  when she has been relatively stable regarding her pain. She is somewhat  reluctant to consider this currently. She feels pressure regarding her  job to maintain current levels of function and is concerned any decrease  in her medications would result in decreased function.   She has reduced  the amount of Flexeril she takes not more than 5 per month now.   I have answered all her questions  at this time. She did sign a narcotic consent. I asked her if she had  any questions regarding that and she stated she understood. I will  continue to monitor her pain medication usage. She has been taking her  medications as prescribed without aberrant behavior. Her last urine  drug screen was consistent. We will refill her hydrocodone 7.5/325, not  more than 4 per day. She does not need refills on her tramadol or  Neurontin.  She would like to start exercising, she has asked several questions regarding this:  Recommend pool exercise program.    I will see her back in 1 month.

## 2012-02-05 NOTE — Patient Instructions (Signed)
Keep Pain meds locked up and in safe location.        Carpal Tunnel Syndrome Fact Sheet You are working at your desk, trying to ignore the tingling or numbness you have had for months in your hand and wrist. Suddenly, a sharp, piercing pain shoots through the wrist and up your arm. Just a passing cramp? More likely you have carpal tunnel syndrome. This is a painful progressive condition caused by compression of a key nerve in the wrist. WHAT IS CARPAL TUNNEL SYNDROME? Carpal tunnel syndrome occurs when the nerve that runs from the forearm into the hand (median nerve), becomes pressed or squeezed at the wrist. The median nerve controls sensations to the palm side of the thumb and fingers (although not the little finger). That nerve also controls impulses to some small muscles in the hand, that allow the fingers and thumb to move. The narrow, rigid passageway of ligament and bones at the base of the hand (carpal tunnel) houses the median nerve and tendons. Sometimes, thickening from irritated tendons or other swelling narrows the tunnel. The narrowing causes the median nerve to be compressed. The result may be pain, weakness, or numbness in the hand and wrist that moves (radiates) up the arm. Although painful sensations may indicate other conditions, carpal tunnel syndrome is the most common and widely known of the entrapment neuropathies. These are conditions in which the body's peripheral nerves are compressed or traumatized. SYMPTOMS   Symptoms usually start gradually, with:   Frequent burning.   Tingling.   Itching numbness in the palm of the hand and the fingers.  This is especially true in the thumb and the index and middle fingers.  Some carpal tunnel sufferers say their fingers feel useless and swollen. They may feel this way even though little or no swelling is apparent.   The symptoms often first appear in one or both hands during the night, since many people sleep with flexed  wrists. A person with carpal tunnel syndrome may wake up feeling the need to shake out the hand or wrist.   As symptoms worsen, people might feel tingling during the day.   Decreased grip strength may make it difficult to:   Form a fist.   Grasp small objects.   Perform other manual tasks.  In chronic and/or untreated cases, the muscles at the base of the thumb may waste away.  Some people are unable to tell between hot and cold by touch.  CAUSES   Carpal tunnel syndrome is often the result of a combination of factors that increase pressure on the median nerve and tendons in the carpal tunnel. It is usually not a problem with the nerve itself.   Most likely the disorder is due to a congenital predisposition. This means that the carpal tunnel is simply smaller in some people than in others.   Other factors include:   Trauma or injury to the wrist that cause swelling, such as sprain or fracture.   Overactivity of the pituitary gland.   Hypothyroidism.   Rheumatoid arthritis.   Mechanical problems in the wrist joint.   Work stress.   Repeated use of vibrating hand tools.   Fluid retention during pregnancy or menopause.   The development of a cyst or tumor in the canal.   In some cases, no cause can be identified.   There is little clinical data to prove whether repetitive and forceful movements of the hand and wrist during work or leisure activities can  cause carpal tunnel syndrome. Repeated motions performed in the course of normal work or other daily activities can result in repetitive motion disorders such as bursitis and tendonitis. Writer's cramp, a condition in which a lack of fine motor skill coordination and ache and pressure in the fingers, wrist, or forearm is brought on by repetitive activity, is not a symptom of carpal tunnel syndrome.  WHO IS AT RISK OF DEVELOPING CARPAL TUNNEL SYNDROME?  Women are three times more likely than men to develop carpal tunnel  syndrome. This may be the result of the carpal tunnel being smaller in women.   The dominant hand is usually affected first and produces the most severe pain.   Diabetes or other metabolic disorders that directly affect the body's nerves make people more susceptible to compression.   Carpal tunnel syndrome usually occurs only in adults.   The risk of developing carpal tunnel syndrome is not confined to people in a single industry or job. However, it is especially common in those performing assembly line work, such as:   Set designer.   Sewing.   Finishing.   Cleaning.   Meat packing.  Carpal tunnel syndrome is three times more common among assemblers than among data-entry personnel. A 2001 study by the [facility] found heavy computer use (up to 7 hours a day) did not increase a person's risk of developing carpal tunnel syndrome.  During 1998, an estimated three of every 10,000 workers lost time from work because of carpal tunnel syndrome. Half of these workers missed more than 10 days of work. The average lifetime cost of carpal tunnel syndrome is estimated to be about $30,000 for each injured worker. This includes medical bills and lost time from work  DIAGNOSIS   Early diagnosis and treatment are important to avoid permanent damage to the median nerve.   A physical examination of the hands, arms, shoulders, and neck can help determine if the patient's complaints are related to daily activities or to an underlying disorder. An exam can rule out other painful conditions that mimic carpal tunnel syndrome.   The wrist is examined for:   Tenderness.   Swelling.   Warmth.   Discoloration.   Each finger should be tested for sensation. The muscles at the base of the hand should be examined for strength and signs of shrinking (atrophy).   Routine laboratory tests and X-rays can reveal:   Diabetes.   Arthritis.   Fractures.   Physicians can use specific tests to try to  produce the symptoms of carpal tunnel syndrome.   In the Tinel test, the caregiver taps or presses on the median nerve in the patient's wrist. The test is positive when tingling in the fingers or a shock-like sensation occurs.   The Phalen or wrist-flexion test involves having the patient hold their forearms upright by pointing the fingers down and pressing the backs of the hands together. The presence of carpal tunnel syndrome is suggested if one or more symptoms is felt in the fingers within 1 minute.   Caregivers may also ask patients to try to make a movement that brings on symptoms.   Often it is necessary to confirm the diagnosis by use of electrodiagnostic tests.   In a nerve conduction study, electrodes are placed on the hand and wrist. Small electric shocks are applied and the speed with which nerves transmit impulses is measured.   In electromyography, a fine needle is inserted into a muscle. The electrical activity is viewed on  a screen. The activity can determine the severity of damage to the median nerve.   Ultrasound imaging can show impaired movement of the median nerve.  TREATMENT  Treatments for carpal tunnel syndrome should begin as early as possible. It should be done under a caregiver's direction. Underlying causes such as diabetes or arthritis should be treated first. Initial treatment generally involves:  Resting the affected hand and wrist for at least 2 weeks.   Avoiding activities that may worsen symptoms.   Immobilizing (keeping from moving) the wrist in a splint.  These things are all done to avoid further damage from twisting or bending. If there is inflammation, applying cool packs can help reduce swelling. NON-SURGICAL  Drugs - In special circumstances, different drugs can ease the pain and swelling.   Nonsteroidal anti-inflammatory drugs, such as aspirin, and other non-prescription pain relievers, may ease symptoms.   Orally administered diuretics (water  pills) can decrease swelling.   Corticosteroids such as prednisone or lidocaine can be injected directly into the wrist or taken by mouth. This can relieve pressure on the median nerve. It may provide immediate, but temporary relief to persons with mild or intermittent symptoms. (Caution: persons with diabetes and those who may be predisposed to diabetes should note that long term use of corticosteroids can make it difficult to regulate insulin levels. Corticosterioids should not be taken without a prescription.)   Additionally, some studies show that vitamin B6 (pyridoxine) supplements may ease the symptoms of carpal tunnel syndrome.   Exercise - Stretching and strengthening exercises can be helpful in people whose symptoms have decreased. These exercises may be supervised by a physical therapist that is trained to use exercises to treat physical impairments. The exercises can also be supervised by an occupational therapist. This is someone who is trained in evaluating people with physical impairments. They will help them build skills to improve their health and well-being.   Alternative therapies - Acupuncture and chiropractic care have helped some patients. Their effectiveness remains unproved. An exception is yoga, which has been shown to reduce pain and improve grip strength among patients with carpal tunnel syndrome.  SURGERY Carpal tunnel release is a surgical procedure commonly used. It is generally recommended if symptoms last for 6 months or more. This surgery involves cutting the band of tissue around the wrist to reduce pressure on the median nerve. Surgery is done under local anesthesia. It does not require an overnight hospital stay. Many patients require surgery on both hands. The following are types of carpal tunnel release surgery:  Open release surgery. This is the traditional procedure used to correct carpal tunnel syndrome. It consists of making a cut (incision) up to 2 inches in the  wrist. Then, the surgeon cuts the carpal ligament to enlarge the carpal tunnel. The procedure is generally done under local anesthesia on an outpatient basis. The only exception is if there are unusual medical considerations.   Endoscopic surgery may allow faster recovery and less postoperative discomfort than traditional open release surgery. The surgeon makes two incisions (about 1/2" each) in the wrist and palm. The surgeon will:   Insert a camera attached to a tube.   Observe the tissue on a screen.   Cut the carpal ligament (the tissue that holds joints together).  This two-portal endoscopic surgery, generally performed under local anesthesia, is effective and minimizes scarring and scar tenderness. One-portal endoscopic surgery for carpal tunnel syndrome is also available. Although symptoms may be relieved immediately after surgery, full recovery  from carpal tunnel surgery can take months. Some patients may have:  Infection.   Nerve damage.   Stiffness.   Pain at the scar.  Occasionally, the wrist loses strength because the carpal ligament is cut. Patients should undergo physical therapy after surgery to restore wrist strength. Some patients may need to adjust job duties or even change jobs after recovery from surgery. Recurrence of carpal tunnel syndrome following treatment is rare. The majority of patients recover completely. PREVENTION  At the workplace, workers can:   Do on-the-job conditioning, perform stretching exercises.   Take frequent rest breaks.   Wear splints to keep wrists straight.   Use correct posture and wrist position.   Wearing finger-less gloves can help keep hands warm and flexible.   Design workstations, tools and tool handles, and tasks to enable the worker's wrist to maintain a natural position.   Rotate jobs among workers.   Employers can develop programs in ergonomics. This is the process of adapting workplace conditions and job demands to the  capabilities of workers. However, research has not conclusively shown that these workplace changes prevent the occurrence of carpal tunnel syndrome.  WHAT RESEARCH IS BEING DONE? The [employer] of [employer] and [employer]roke (NINDS), a part of the [employer], is the federal government's leading supporter of research on carpal tunnel syndrome. Scientists are studying the order of events that occur with carpal tunnel syndrome. They do this to better understand, treat, and prevent this ailment.  WHAT IS PERCUTANEOUS BALLOON CARPAL TUNNEL-PLASTY? Percutaneous balloon carpal tunnel-plasty is an experimental technique that can ease carpal tunnel pain without cutting the carpal ligament. In this procedure, a 1/4 -inch cut is made at the base of the palm. The caregiver then inserts a balloon through a catheter under the carpal ligament and inflates the balloon to stretch the ligament and free the nerve. Patients in one small study of this procedure reported relief of symptoms with no complications after the surgery. Most of them were back to work within 2 weeks. This experimental technique is not yet widely available. FOR MORE INFORMATION American Academy of Orthopaedic Surgeons: www.aaos.org  Centers for Disease Control and Prevention (CDCP): FootballExhibition.com.br [employer] of Arthritis and Musculoskeletal and Skin Diseases (NIAMS): www.niams.http://www.myers.net/ American Chronic Pain Association (ACPA): www.theacpa.org National Chronic Pain Outreach Association (NCPOA): www.chronicpain.org Occupational Safety & Health Administration: ArmyDictionary.fi Document Released: 05/12/2004 Document Revised: 06/10/2011 Document Reviewed: 05/16/2008 [facility] Patient Information 2012 [place], [place].

## 2012-02-07 ENCOUNTER — Encounter: Payer: Self-pay | Admitting: Internal Medicine

## 2012-02-20 ENCOUNTER — Other Ambulatory Visit: Payer: Self-pay | Admitting: Internal Medicine

## 2012-02-28 ENCOUNTER — Other Ambulatory Visit: Payer: Self-pay | Admitting: Internal Medicine

## 2012-03-04 ENCOUNTER — Encounter: Payer: 59 | Attending: Physical Medicine and Rehabilitation | Admitting: Physical Medicine and Rehabilitation

## 2012-03-04 ENCOUNTER — Encounter: Payer: Self-pay | Admitting: Physical Medicine and Rehabilitation

## 2012-03-04 VITALS — BP 108/68 | HR 108 | Resp 14 | Ht 65.0 in | Wt 125.0 lb

## 2012-03-04 DIAGNOSIS — M79673 Pain in unspecified foot: Secondary | ICD-10-CM

## 2012-03-04 DIAGNOSIS — M25569 Pain in unspecified knee: Secondary | ICD-10-CM | POA: Insufficient documentation

## 2012-03-04 DIAGNOSIS — M549 Dorsalgia, unspecified: Secondary | ICD-10-CM | POA: Insufficient documentation

## 2012-03-04 DIAGNOSIS — M79609 Pain in unspecified limb: Secondary | ICD-10-CM

## 2012-03-04 DIAGNOSIS — Q762 Congenital spondylolisthesis: Secondary | ICD-10-CM

## 2012-03-04 DIAGNOSIS — R29898 Other symptoms and signs involving the musculoskeletal system: Secondary | ICD-10-CM

## 2012-03-04 DIAGNOSIS — M4317 Spondylolisthesis, lumbosacral region: Secondary | ICD-10-CM

## 2012-03-04 MED ORDER — OXYCODONE-ACETAMINOPHEN 5-325 MG PO TABS: 1.0000 | ORAL_TABLET | ORAL | Status: DC | PRN
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
## 2012-03-04 NOTE — Patient Instructions (Signed)
I have discontinued your Vicodin and your tramadol.  I have given you a prescription for Percocet. Please take the least amount that we'll keep you functional but not more than 6 tablets per day.  Caution regarding driving and operating machinery as this medication can make a drowsy.  You have been given a brochure on lumbar medial branch blocks to help manage low back pain.  Consider electrodiagnostic studies to evaluate for weakness in your right leg.  I understand that you may be consulting with a spine surgeon.

## 2012-03-09 ENCOUNTER — Telehealth: Payer: Self-pay | Admitting: Physical Medicine and Rehabilitation

## 2012-03-09 NOTE — Telephone Encounter (Signed)
Please advise 

## 2012-03-09 NOTE — Telephone Encounter (Signed)
Patient requesting nerve block

## 2012-03-11 ENCOUNTER — Telehealth: Payer: Self-pay | Admitting: Physical Medicine and Rehabilitation

## 2012-03-11 NOTE — Telephone Encounter (Signed)
Patient called requesting follow up on a message she left about a nerve block

## 2012-03-11 NOTE — Telephone Encounter (Signed)
Pt aware that we are waiting on Dr. Pamelia Hoit to give [place] a yes or no to her request for a nerve block. Pt understood.

## 2012-03-11 NOTE — Telephone Encounter (Signed)
Please arranged for Vanessa Payne be set up with Dr. Don Perking for medial branch block lumbar spine

## 2012-03-11 NOTE — Telephone Encounter (Signed)
Please arranged for Vanessa Payne to be set up for medial branch block lumbar spine Dr. Wynn Banker.Marland Kitchen  Please let her know.

## 2012-03-14 ENCOUNTER — Telehealth: Payer: Self-pay | Admitting: *Deleted

## 2012-03-14 MED ORDER — OXYCODONE-ACETAMINOPHEN 5-325 MG PO TABS: 1.0000 | ORAL_TABLET | Freq: Four times a day (QID) | ORAL | Status: DC | PRN

## 2012-03-14 NOTE — Telephone Encounter (Signed)
She was told to call Dr. Pamelia Hoit and let her know that she is taking 4-5 Oxycodone per day and will need a refill on this. She needs to pick this rx by 2:00pm today.

## 2012-03-14 NOTE — Telephone Encounter (Signed)
Ok to fill rx, I have ordered and printed a prescription for Vanessa Payne.  Please give her a call and let her know.  Thanks

## 2012-03-14 NOTE — Telephone Encounter (Signed)
Pt aware.

## 2012-03-14 NOTE — Telephone Encounter (Signed)
Rx ready for pick up, pt aware 

## 2012-03-31 ENCOUNTER — Ambulatory Visit: Payer: 59 | Admitting: Physical Medicine and Rehabilitation

## 2012-04-12 ENCOUNTER — Encounter: Payer: 59 | Attending: Physical Medicine and Rehabilitation | Admitting: Physical Medicine and Rehabilitation

## 2012-04-12 ENCOUNTER — Encounter: Payer: Self-pay | Admitting: Physical Medicine and Rehabilitation

## 2012-04-12 VITALS — BP 115/82 | HR 84 | Resp 16 | Ht 65.0 in | Wt 122.6 lb

## 2012-04-12 DIAGNOSIS — M129 Arthropathy, unspecified: Secondary | ICD-10-CM | POA: Insufficient documentation

## 2012-04-12 DIAGNOSIS — M4317 Spondylolisthesis, lumbosacral region: Secondary | ICD-10-CM

## 2012-04-12 DIAGNOSIS — G43909 Migraine, unspecified, not intractable, without status migrainosus: Secondary | ICD-10-CM | POA: Insufficient documentation

## 2012-04-12 DIAGNOSIS — G8929 Other chronic pain: Secondary | ICD-10-CM | POA: Insufficient documentation

## 2012-04-12 DIAGNOSIS — M545 Low back pain, unspecified: Secondary | ICD-10-CM | POA: Insufficient documentation

## 2012-04-12 DIAGNOSIS — Q762 Congenital spondylolisthesis: Secondary | ICD-10-CM | POA: Insufficient documentation

## 2012-04-12 DIAGNOSIS — M79609 Pain in unspecified limb: Secondary | ICD-10-CM | POA: Insufficient documentation

## 2012-04-12 MED ORDER — OXYCODONE-ACETAMINOPHEN 5-325 MG PO TABS: 1.0000 | ORAL_TABLET | Freq: Four times a day (QID) | ORAL | Status: DC | PRN

## 2012-04-12 MED ORDER — GABAPENTIN 300 MG PO CAPS: 2100.0000 mg | ORAL_CAPSULE | Freq: Every day | ORAL | Status: DC

## 2012-04-12 NOTE — Patient Instructions (Signed)
Continue with core exercises, continue with medication.
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
## 2012-04-15 ENCOUNTER — Telehealth: Payer: Self-pay | Admitting: *Deleted

## 2012-04-15 MED ORDER — ZOLPIDEM TARTRATE 10 MG PO TABS: 5.0000 mg | ORAL_TABLET | Freq: Every evening | ORAL | Status: DC | PRN

## 2012-04-15 NOTE — Telephone Encounter (Signed)
Rf req for Zolpidem 10 mg. Ok to Rf? 

## 2012-04-15 NOTE — Telephone Encounter (Signed)
OK to fill this prescription with additional refills x5 Thank you!  

## 2012-04-15 NOTE — Telephone Encounter (Signed)
Done

## 2012-05-12 ENCOUNTER — Encounter: Payer: Self-pay | Admitting: Physical Medicine & Rehabilitation

## 2012-05-12 ENCOUNTER — Ambulatory Visit (HOSPITAL_BASED_OUTPATIENT_CLINIC_OR_DEPARTMENT_OTHER): Payer: 59 | Admitting: Physical Medicine & Rehabilitation

## 2012-05-12 ENCOUNTER — Encounter: Payer: 59 | Attending: Physical Medicine and Rehabilitation

## 2012-05-12 VITALS — BP 113/76 | HR 97 | Resp 16 | Ht 65.0 in | Wt 119.8 lb

## 2012-05-12 DIAGNOSIS — M47817 Spondylosis without myelopathy or radiculopathy, lumbosacral region: Secondary | ICD-10-CM

## 2012-05-12 DIAGNOSIS — M25569 Pain in unspecified knee: Secondary | ICD-10-CM | POA: Insufficient documentation

## 2012-05-12 DIAGNOSIS — M549 Dorsalgia, unspecified: Secondary | ICD-10-CM | POA: Insufficient documentation

## 2012-05-12 DIAGNOSIS — M79609 Pain in unspecified limb: Secondary | ICD-10-CM | POA: Insufficient documentation

## 2012-05-12 MED ORDER — OXYCODONE-ACETAMINOPHEN 5-325 MG PO TABS: 1.0000 | ORAL_TABLET | Freq: Four times a day (QID) | ORAL | Status: DC | PRN

## 2012-05-12 NOTE — Progress Notes (Signed)
  PROCEDURE RECORD [facility]   Name: Vanessa Payne DOB:10-29-1970 MRN: [id number]  Date:05/12/2012  Physician: Claudette Laws, MD    Nurse/CMA: Shumaker RN  Allergies:  Allergies  Allergen Reactions  . Ketorolac Tromethamine Anaphylaxis    Tongue swells  . Zolmitriptan     Didn't like it    Consent Signed: yes  Is patient diabetic? no  CBG today?   Pregnant: no LMP: No LMP recorded. (age 41-55)April 24 2012  Anticoagulants: no Anti-inflammatory: no Antibiotics: no  Procedure: Medial Branch Block Bilateral      Position: Prone Start Time: 11:51 End Time:  11:58     Fluoro Time:20 sec   RN/CMA Designer, multimedia    Time 11:19 12:03    BP 113/76 129/76    Pulse 97 99    Respirations 16 16    O2 Sat 100 98    S/S 6 6    Pain Level 9/10      D/C home with husband, [name] & O X 3, D/C instructions reviewed, and sits independently.

## 2012-05-12 NOTE — Patient Instructions (Signed)
Facet Block A facet block is an injection procedure used to numb nerves near a spinal joint (facet). The injection usually includes a medicine like Novacaine (anesthetic) and a steroid medicine (similar to cortisone). The injections are made directly into the facet joint of the back. They are used for patients with several types of neck or back pain problems (such as worsening arthritis or persistent pain after surgery) that have not been helped with anti-inflammatory medications, exercise programs, physical therapy, and other forms of pain management. Multiple injections may be needed depending on how many joints are involved.  A facet block procedure can be helpful with diagnosis as well as providing therapeutic pain relief. One of three things may happen after the procedure:  The pain does not go away. This can mean that the pain is probably not coming from blocked facet joints. This information is helpful with diagnosis.   The pain goes away and stays away for a few hours but the original pain comes back and does not get better again. This information is also helpful with diagnosis. It can mean that pain is probably coming from the joints; but the steroid was not helpful for longer term pain control.   The pain goes away after the block, then returns later that day, and then gets better again over the next few days. This can mean that the block was helpful for pain control and the steroid had a longer lasting effect.  If there is good, lasting benefit from the injections, the block may be repeated from 3 to 5 times. If there is good relief but it is only of short-term benefit, other procedures (such as radiofrequency lesioning) may be considered.  Note: The procedure cannot be performed if you have an active infection, a lesion on or near the area of injection, flu, cold, fever, very high blood pressure or if you are on blood thinners. Please make your doctor aware of any of these conditions. This is  for your safety!  LET YOUR CAREGIVER KNOW ABOUT:   Allergies.   Medications taken including herbs, eye drops, over the counter medications, and creams   Use of steroids (by mouth or creams).   Possible pregnancy, if applicable.   Previous problems with anesthetics or Novocaine.   History of blood clots.   History of bleeding or blood problems.   Previous surgery, particularly of the neck and/or back   Other health problems.  RISKS AND COMPLICATIONS These are very uncommon but include:  Bleeding.   Injury to a nerve near the injection site.   Weakness or numbness in areas controlled by nerves near the injection site.   Infection.   Pain at the site of the injection.   Temporary fluid retention in those who are prone to this problem.   Allergic reaction to anesthetics or medicines used during the procedure.  Diabetics may have a temporary increase in their blood sugar after any surgical procedure, especially if steroids are used. Stinging/burning of the numbing medicine is the most uncomfortable part of the procedure; however every person's response to any procedure is individual.  BEFORE THE PROCEDURE   Your caregiver will provide instructions about stopping any medication before the procedure.   Unless advised otherwise, if the injections are in your neck, you may take your medications as usual with a sip of water but do not eat or drink for 6 hours before the procedure.   Unless advised otherwise, you may eat, drink and take your medications as   usual on the day of the procedure (both before and after) if the injections are to be in your lower back.   There is no other specific preparation necessary unless advised otherwise.  PROCEDURE After checking your blood pressure, the procedure will be done in the x-ray (fluoroscopy) room while lying on your stomach. For procedures in the neck, an intravenous line is usually started. The back is then cleansed with an antiseptic  soap. Sterile drapes are placed in this area. The skin is numbed with a local anesthetic. This is felt as a stinging or burning sensation. Using x-ray guidance, needles are then advanced to the appropriate locations. Once the needles are in the proper location, the anesthetic and steroid is injected through the needles and the needles are removed. The skin is then cleansed and bandages are applied. Blood pressure will be checked again, and you will be discharged to leave with your ride after your caregiver says it is okay to go.  AFTER THE PROCEDURE  You may not drive for the remainder of the day after your procedure. An adult must be present to drive you home or to go with you in a taxi or on public transportation. The procedure will be canceled if you do not have a responsible adult with you! This is for your safety.  HOME CARE INSTRUCTIONS   The bandages noted above can be removed on the morning after the procedure.   Resume medications according to your caregiver's instructions.   No heat is to be used near or over the injected area(s) for the remainder of the day.   No tub bath or soaking in water (such as a pool, jacuzzi, etc.) for the remainder of the day.   Some local tenderness may be experienced for a couple of days after the injection. Using an ice pack three or four times a day will help this.   Keep track of the amount of pain relief as well as how long the pain relief lasted.  SEEK MEDICAL CARE IF:   There is drainage from the injection site.   Pain is not controlled with medications prescribed.   There is significant bleeding or swelling.  SEEK IMMEDIATE MEDICAL CARE IF:   You develop a fever of 101 F (38.3 C) or greater.   Worsening pain, swelling, and/or red streaking develops in the skin around the injection site.   Severe pain develops and cannot be controlled with medications prescribed.   You develop any headache, stiff neck, nausea, vomiting, or your eyes become  very sensitive to light.   Weakness or paralysis develops in arms or legs not present before the procedure.   You develop difficulty urinating or difficulty breathing.  Document Released: 02/17/2007 Document Revised: 09/17/2011 Document Reviewed: 02/07/2009 Exit[employer] Patient Information 2012 [employer]. 

## 2012-05-12 NOTE — Progress Notes (Signed)

## 2012-06-10 ENCOUNTER — Encounter: Payer: 59 | Attending: Physical Medicine and Rehabilitation | Admitting: Physical Medicine and Rehabilitation

## 2012-06-10 ENCOUNTER — Encounter: Payer: Self-pay | Admitting: Physical Medicine and Rehabilitation

## 2012-06-10 VITALS — BP 135/82 | HR 112 | Resp 16 | Ht 65.0 in | Wt 122.0 lb

## 2012-06-10 DIAGNOSIS — Q762 Congenital spondylolisthesis: Secondary | ICD-10-CM | POA: Insufficient documentation

## 2012-06-10 DIAGNOSIS — G43909 Migraine, unspecified, not intractable, without status migrainosus: Secondary | ICD-10-CM | POA: Insufficient documentation

## 2012-06-10 DIAGNOSIS — G8929 Other chronic pain: Secondary | ICD-10-CM | POA: Insufficient documentation

## 2012-06-10 DIAGNOSIS — M545 Low back pain, unspecified: Secondary | ICD-10-CM

## 2012-06-10 DIAGNOSIS — M79609 Pain in unspecified limb: Secondary | ICD-10-CM

## 2012-06-10 DIAGNOSIS — M129 Arthropathy, unspecified: Secondary | ICD-10-CM | POA: Insufficient documentation

## 2012-06-10 MED ORDER — OXYCODONE-ACETAMINOPHEN 5-325 MG PO TABS: 1.0000 | ORAL_TABLET | Freq: Four times a day (QID) | ORAL | Status: DC | PRN
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
## 2012-06-10 NOTE — Patient Instructions (Signed)
I have refilled your pain medication.  I understand the last injections helped about one week you would like to repeat them.  We have also discussed consideration of a more supportive back brace.  Please make sure you keep your medications locked up and in a secure location.  Refrain from driving and using machinery while you're on these medicines if they make you drowsy or less alert.  Followup in one month

## 2012-06-28 ENCOUNTER — Other Ambulatory Visit: Payer: Self-pay | Admitting: Physical Medicine and Rehabilitation

## 2012-07-14 ENCOUNTER — Other Ambulatory Visit: Payer: Self-pay | Admitting: Internal Medicine

## 2012-07-14 ENCOUNTER — Encounter: Payer: 59 | Attending: Physical Medicine and Rehabilitation

## 2012-07-14 ENCOUNTER — Encounter: Payer: Self-pay | Admitting: Physical Medicine & Rehabilitation

## 2012-07-14 ENCOUNTER — Ambulatory Visit (HOSPITAL_BASED_OUTPATIENT_CLINIC_OR_DEPARTMENT_OTHER): Payer: 59 | Admitting: Physical Medicine & Rehabilitation

## 2012-07-14 VITALS — BP 126/61 | HR 94 | Resp 14 | Ht 65.0 in | Wt 126.0 lb

## 2012-07-14 DIAGNOSIS — M47817 Spondylosis without myelopathy or radiculopathy, lumbosacral region: Secondary | ICD-10-CM | POA: Insufficient documentation

## 2012-07-14 DIAGNOSIS — M25569 Pain in unspecified knee: Secondary | ICD-10-CM | POA: Insufficient documentation

## 2012-07-14 DIAGNOSIS — M549 Dorsalgia, unspecified: Secondary | ICD-10-CM | POA: Insufficient documentation

## 2012-07-14 DIAGNOSIS — M79609 Pain in unspecified limb: Secondary | ICD-10-CM | POA: Insufficient documentation

## 2012-07-14 MED ORDER — OXYCODONE-ACETAMINOPHEN 5-325 MG PO TABS: 1.0000 | ORAL_TABLET | Freq: Four times a day (QID) | ORAL | Status: DC | PRN

## 2012-07-14 NOTE — Progress Notes (Signed)
  PROCEDURE RECORD [facility]   Name: Vanessa Payne DOB:01/20/1971 MRN: [id number]  Date:07/14/2012  Physician: Claudette Laws, MD    Nurse/CMA: Elysse Polidore/Shumaker RN  Allergies:  Allergies  Allergen Reactions  . Ketorolac Tromethamine Anaphylaxis    Tongue swells  . Zolmitriptan     Didn't like it    Consent Signed: yes  Is patient diabetic? no  Pregnant: no LMP: No LMP recorded. (age 41-55)  Anticoagulants: no Anti-inflammatory: no Antibiotics: no  Procedure: Medial branch block Bilateral  Position: Prone Start Time:9:59 End Time: 10:08 Fluoro Time: 23 seconds  RN/CMA Vanessa Payne,CMA Shumaker RN    Time 9:40am 10:14    BP 126/61 129/77    Pulse 94 94    Respirations 14 14    O2 Sat 100 99    S/S 6 6    Pain Level 9/10 7/10     D/C home with Husband-Scott, [name] & O X 3, D/C instructions reviewed, and sits independently.

## 2012-07-14 NOTE — Progress Notes (Signed)
  Subjective:    Patient ID: Aleda Grana, female    DOB: [birth year], 41 y.o.   MRN: [id number]  HPI  50% relief for 1 week after last injection  Review of Systems     Objective:   Physical Exam        Assessment & Plan:  Bilateral Lumbar L3, L4  medial branch blocks and L 5 dorsal ramus injection under fluoroscopic guidance  Indication: Lumbar pain which is not relieved by medication management or other conservative care and interfering with self-care and mobility.  Informed consent was obtained after describing risks and benefits of the procedure with the patient, this includes bleeding, infection, paralysis and medication side effects.  The patient wishes to proceed and has given written consent.  The patient was placed in prone position.  The lumbar area was marked and prepped with Betadine.  One mL of 1% lidocaine was injected into each of 6 areas into the skin and subcutaneous tissue.  Then a 22-gauge 3.5 spinal needle was inserted targeting the junction of the left S1 superior articular process and sacral ala junction. Needle was advanced under fluoroscopic guidance.  Bone contact was made.  Omnipaque 180 was injected x 0.5 mL demonstrating no intravascular uptake.  Then a solution containing one mL of 4 mg per mL dexamethasone and 3 mL of 2% MPF lidocaine was injected x 0.5 mL.  Then the left L5 superior articular process in transverse process junction was targeted.  Bone contact was made.  Omnipaque 180 was injected x 0.5 mL demonstrating no intravascular uptake. Then a solution containing one mL of 4 mg per mL dexamethasone and 3 mL of 2% MPF lidocaine was injected x 0.5 mL.  Then the left L4 superior articular process in transverse process junction was targeted.  Bone contact was made.  Omnipaque 180 was injected x 0.5 mL demonstrating no intravascular uptake.  Then a solution containing one mL of 4 mg per mL dexamethasone and 3 mL if 2% MPF lidocaine was injected x 0.5 mL.  This  same procedure was performed on the right side using the same needle, technique and injectate.  Patient tolerated procedure well.  Post procedure instructions were given.  Will have repeat appointment with Dr. Pamelia Hoit If short-term relief of 50% or more in the low back area would be a good candidate for radiofrequency neurotomy

## 2012-07-14 NOTE — Addendum Note (Signed)
Addended by: Judd Gaudier on: 07/14/2012 10:17 AM   Modules accepted: Orders

## 2012-07-14 NOTE — Patient Instructions (Signed)
Keep track of pain report to Dr. Kirtland Bouchard.

## 2012-07-25 ENCOUNTER — Telehealth: Payer: Self-pay | Admitting: *Deleted

## 2012-07-25 NOTE — Telephone Encounter (Signed)
Pt had a bad fall this weekend and she saw Dr. Shon Baton this morning and he suggests pt needs a lumbar ESI. She wants to get this scheduled ASAP. Please call.

## 2012-07-26 ENCOUNTER — Telehealth: Payer: Self-pay | Admitting: *Deleted

## 2012-07-26 DIAGNOSIS — R5381 Other malaise: Secondary | ICD-10-CM

## 2012-07-26 DIAGNOSIS — R5383 Other fatigue: Secondary | ICD-10-CM

## 2012-07-26 DIAGNOSIS — E538 Deficiency of other specified B group vitamins: Secondary | ICD-10-CM

## 2012-07-26 DIAGNOSIS — R7989 Other specified abnormal findings of blood chemistry: Secondary | ICD-10-CM

## 2012-07-26 NOTE — Telephone Encounter (Signed)
Pt has 6 mo f/u scheduled next Tues. What labs if any does she need?

## 2012-07-26 NOTE — Telephone Encounter (Signed)
CBC, CMET, Vit D, Vit B12, TSH, ESR Thx

## 2012-07-26 NOTE — Telephone Encounter (Signed)
I spoke to pt and we are going to get her set up for some time after November 3rd because she just had a RF on 07/14/12.

## 2012-07-27 NOTE — Telephone Encounter (Signed)
Left detailed mess informing pt of labs ordered.

## 2012-08-02 ENCOUNTER — Ambulatory Visit (INDEPENDENT_AMBULATORY_CARE_PROVIDER_SITE_OTHER): Payer: 59 | Admitting: Internal Medicine

## 2012-08-02 ENCOUNTER — Encounter: Payer: Self-pay | Admitting: Internal Medicine

## 2012-08-02 ENCOUNTER — Other Ambulatory Visit (INDEPENDENT_AMBULATORY_CARE_PROVIDER_SITE_OTHER): Payer: 59

## 2012-08-02 VITALS — BP 122/76 | HR 96 | Temp 98.3°F | Ht 65.0 in | Wt 131.0 lb

## 2012-08-02 DIAGNOSIS — Z23 Encounter for immunization: Secondary | ICD-10-CM

## 2012-08-02 DIAGNOSIS — R5383 Other fatigue: Secondary | ICD-10-CM

## 2012-08-02 DIAGNOSIS — F17200 Nicotine dependence, unspecified, uncomplicated: Secondary | ICD-10-CM

## 2012-08-02 DIAGNOSIS — F329 Major depressive disorder, single episode, unspecified: Secondary | ICD-10-CM

## 2012-08-02 DIAGNOSIS — G43819 Other migraine, intractable, without status migrainosus: Secondary | ICD-10-CM

## 2012-08-02 DIAGNOSIS — R5381 Other malaise: Secondary | ICD-10-CM

## 2012-08-02 DIAGNOSIS — F411 Generalized anxiety disorder: Secondary | ICD-10-CM

## 2012-08-02 DIAGNOSIS — G43119 Migraine with aura, intractable, without status migrainosus: Secondary | ICD-10-CM

## 2012-08-02 DIAGNOSIS — R7989 Other specified abnormal findings of blood chemistry: Secondary | ICD-10-CM

## 2012-08-02 DIAGNOSIS — E538 Deficiency of other specified B group vitamins: Secondary | ICD-10-CM

## 2012-08-02 DIAGNOSIS — M545 Low back pain, unspecified: Secondary | ICD-10-CM

## 2012-08-02 DIAGNOSIS — F3289 Other specified depressive episodes: Secondary | ICD-10-CM

## 2012-08-02 LAB — CBC WITH DIFFERENTIAL/PLATELET
Basophils Absolute: 0 10*3/uL (ref 0.0–0.1)
Basophils Relative: 0.6 % (ref 0.0–3.0)
Eosinophils Absolute: 0.1 10*3/uL (ref 0.0–0.7)
Eosinophils Relative: 0.8 % (ref 0.0–5.0)
HCT: 37.1 % (ref 36.0–46.0)
Hemoglobin: 12 g/dL (ref 12.0–15.0)
Lymphocytes Relative: 53.2 % — ABNORMAL HIGH (ref 12.0–46.0)
Lymphs Abs: 4.1 10*3/uL — ABNORMAL HIGH (ref 0.7–4.0)
MCHC: 32.2 g/dL (ref 30.0–36.0)
MCV: 96.4 fl (ref 78.0–100.0)
Monocytes Absolute: 0.6 10*3/uL (ref 0.1–1.0)
Monocytes Relative: 7.7 % (ref 3.0–12.0)
Neutro Abs: 2.9 10*3/uL (ref 1.4–7.7)
Neutrophils Relative %: 37.7 % — ABNORMAL LOW (ref 43.0–77.0)
Platelets: 348 10*3/uL (ref 150.0–400.0)
RBC: 3.85 Mil/uL — ABNORMAL LOW (ref 3.87–5.11)
RDW: 14 % (ref 11.5–14.6)
WBC: 7.7 10*3/uL (ref 4.5–10.5)

## 2012-08-02 MED ORDER — METHOCARBAMOL 500 MG PO TABS: 500.0000 mg | ORAL_TABLET | Freq: Two times a day (BID) | ORAL | Status: DC | PRN

## 2012-08-02 MED ORDER — ALPRAZOLAM 0.5 MG PO TABS: 0.5000 mg | ORAL_TABLET | Freq: Two times a day (BID) | ORAL | Status: DC | PRN

## 2012-08-02 MED ORDER — TOPIRAMATE 100 MG PO TABS: 100.0000 mg | ORAL_TABLET | Freq: Every day | ORAL | Status: DC

## 2012-08-02 MED ORDER — VENLAFAXINE HCL 75 MG PO TABS: 225.0000 mg | ORAL_TABLET | Freq: Every morning | ORAL | Status: DC

## 2012-08-02 MED ORDER — BUTALBITAL-APAP-CAFFEINE 50-325-40 MG PO TABS: 1.0000 | ORAL_TABLET | Freq: Four times a day (QID) | ORAL | Status: DC | PRN

## 2012-08-02 MED ORDER — ZOLPIDEM TARTRATE 10 MG PO TABS: 5.0000 mg | ORAL_TABLET | Freq: Every evening | ORAL | Status: DC | PRN

## 2012-08-02 NOTE — Assessment & Plan Note (Signed)
She is looking for another job

## 2012-08-02 NOTE — Assessment & Plan Note (Signed)
Continue with current prescription therapy as reflected on the Med list.  

## 2012-08-02 NOTE — Progress Notes (Signed)
  Subjective:    Patient ID: Vanessa Payne, female    DOB: 05-Jun-1971, 41 y.o.   MRN: [id number]  HPI   . The patient is here to follow up on chronic depression, anxiety, headaches and chronic moderate fibromyalgia symptoms controlled with medicines, diet and exercise.  Working 75+h per week  BP Readings from Last 3 Encounters:  08/02/12 122/76  07/14/12 126/61  06/10/12 135/82   Wt Readings from Last 3 Encounters:  08/02/12 131 lb (59.421 kg)  07/14/12 126 lb (57.153 kg)  06/10/12 122 lb (55.339 kg)     Review of Systems  Constitutional: Negative for chills, activity change, appetite change, fatigue and unexpected weight change.  HENT: Negative for congestion, mouth sores and sinus pressure.   Eyes: Negative for visual disturbance.  Respiratory: Negative for cough and chest tightness.   Gastrointestinal: Negative for nausea and abdominal pain.  Genitourinary: Negative for frequency, difficulty urinating and vaginal pain.  Musculoskeletal: Positive for myalgias, arthralgias and gait problem. Negative for back pain.  Skin: Negative for pallor and rash.  Neurological: Negative for dizziness, tremors, weakness, numbness and headaches.  Psychiatric/Behavioral: Positive for disturbed wake/sleep cycle and dysphoric mood. Negative for suicidal ideas, hallucinations, confusion, self-injury and agitation. The patient is nervous/anxious. The patient is not hyperactive.        Depressed, stressed       Objective:   Physical Exam  Constitutional: She appears well-developed and well-nourished. No distress.  HENT:  Head: Normocephalic.  Right Ear: External ear normal.  Left Ear: External ear normal.  Nose: Nose normal.  Mouth/Throat: Oropharynx is clear and moist.  Eyes: Conjunctivae normal are normal. Pupils are equal, round, and reactive to light. Right eye exhibits no discharge. Left eye exhibits no discharge.  Neck: Normal range of motion. Neck supple. No JVD present. No  tracheal deviation present. No thyromegaly present.  Cardiovascular: Normal rate, regular rhythm and normal heart sounds.   Pulmonary/Chest: No stridor. No respiratory distress. She has no wheezes.  Abdominal: Soft. Bowel sounds are normal. She exhibits no distension and no mass. There is no tenderness. There is no rebound and no guarding.  Musculoskeletal: She exhibits no edema and no tenderness.  Lymphadenopathy:    She has no cervical adenopathy.  Neurological: She displays normal reflexes. No cranial nerve deficit. She exhibits normal muscle tone. Coordination normal.  Skin: No rash noted. No erythema.  Psychiatric: Her behavior is normal. Judgment and thought content normal.   Lab Results  Component Value Date   WBC 12.4* 01/27/2012   HGB 12.8 01/27/2012   HCT 39.3 01/27/2012   PLT 387.0 01/27/2012   GLUCOSE 68* 01/27/2012   CHOL 203* 01/27/2012   TRIG 183.0* 01/27/2012   HDL 37.20* 01/27/2012   LDLDIRECT 137.9 01/27/2012   ALT 12 01/27/2012   AST 19 01/27/2012   NA 140 01/27/2012   K 4.9 01/27/2012   CL 110 01/27/2012   CREATININE 0.8 01/27/2012   BUN 5* 01/27/2012   CO2 22 01/27/2012   TSH 0.97 01/27/2012          Assessment & Plan:

## 2012-08-03 ENCOUNTER — Other Ambulatory Visit: Payer: Self-pay

## 2012-08-03 LAB — SEDIMENTATION RATE: Sed Rate: 28 mm/hr — ABNORMAL HIGH (ref 0–22)

## 2012-08-03 LAB — VITAMIN D 25 HYDROXY (VIT D DEFICIENCY, FRACTURES): Vit D, 25-Hydroxy: 45 ng/mL (ref 30–89)

## 2012-08-03 MED ORDER — GABAPENTIN 300 MG PO CAPS: 300.0000 mg | ORAL_CAPSULE | ORAL | Status: DC

## 2012-08-04 LAB — TSH: TSH: 2.3 u[IU]/mL (ref 0.35–5.50)

## 2012-08-04 LAB — COMPREHENSIVE METABOLIC PANEL
ALT: 16 U/L (ref 0–35)
AST: 19 U/L (ref 0–37)
Albumin: 3.4 g/dL — ABNORMAL LOW (ref 3.5–5.2)
Alkaline Phosphatase: 97 U/L (ref 39–117)
BUN: 6 mg/dL (ref 6–23)
CO2: 22 mEq/L (ref 19–32)
Calcium: 9.4 mg/dL (ref 8.4–10.5)
Chloride: 115 mEq/L — ABNORMAL HIGH (ref 96–112)
Creatinine, Ser: 0.8 mg/dL (ref 0.4–1.2)
GFR: 84.99 mL/min (ref >60.00)
Glucose, Bld: 70 mg/dL (ref 70–99)
Potassium: 4.2 mEq/L (ref 3.5–5.1)
Sodium: 146 mEq/L — ABNORMAL HIGH (ref 135–145)
Total Bilirubin: 0.4 mg/dL (ref 0.3–1.2)
Total Protein: 7.3 g/dL (ref 6.0–8.3)

## 2012-08-04 LAB — VITAMIN B12: Vitamin B-12: 1124 pg/mL — ABNORMAL HIGH (ref 211–911)

## 2012-08-10 ENCOUNTER — Telehealth: Payer: Self-pay

## 2012-08-10 NOTE — Telephone Encounter (Signed)
Patient would just like her medications (oxycodone) refilled and would like to miss appt.  She is having surgery nov 27.  Pt is having back surgery with [facility].  Please advise.

## 2012-08-10 NOTE — Telephone Encounter (Signed)
Patient called to talk to Dr Pamelia Hoit regarding a surgery she is going to have.  Please call.

## 2012-08-15 ENCOUNTER — Ambulatory Visit: Payer: 59 | Admitting: Physical Medicine and Rehabilitation

## 2012-08-16 ENCOUNTER — Ambulatory Visit: Payer: 59 | Admitting: Physical Medicine & Rehabilitation

## 2012-08-18 ENCOUNTER — Encounter: Payer: Self-pay | Admitting: Physical Medicine and Rehabilitation

## 2012-08-18 ENCOUNTER — Encounter: Payer: 59 | Attending: Physical Medicine and Rehabilitation | Admitting: Physical Medicine and Rehabilitation

## 2012-08-18 VITALS — BP 135/86 | HR 76 | Resp 14 | Ht 65.0 in | Wt 125.8 lb

## 2012-08-18 DIAGNOSIS — M4310 Spondylolisthesis, site unspecified: Secondary | ICD-10-CM

## 2012-08-18 DIAGNOSIS — Q762 Congenital spondylolisthesis: Secondary | ICD-10-CM | POA: Insufficient documentation

## 2012-08-18 DIAGNOSIS — M545 Low back pain, unspecified: Secondary | ICD-10-CM | POA: Insufficient documentation

## 2012-08-18 DIAGNOSIS — G8929 Other chronic pain: Secondary | ICD-10-CM | POA: Insufficient documentation

## 2012-08-18 DIAGNOSIS — M129 Arthropathy, unspecified: Secondary | ICD-10-CM | POA: Insufficient documentation

## 2012-08-18 DIAGNOSIS — M79609 Pain in unspecified limb: Secondary | ICD-10-CM | POA: Insufficient documentation

## 2012-08-18 MED ORDER — OXYCODONE-ACETAMINOPHEN 5-325 MG PO TABS: 1.0000 | ORAL_TABLET | Freq: Four times a day (QID) | ORAL | Status: DC | PRN
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
## 2012-08-18 NOTE — Patient Instructions (Signed)
Try to rest as much as possible, place your legs in a 90 degree /90 degree position, on pillows for example, use a heating pad,. You could also try Ginger tea 100% , or Arnica cream for additional pain relief.

## 2012-08-24 ENCOUNTER — Encounter (HOSPITAL_COMMUNITY): Payer: Self-pay
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
## 2012-09-01 ENCOUNTER — Encounter (HOSPITAL_COMMUNITY): Payer: Self-pay

## 2012-09-01 ENCOUNTER — Encounter (HOSPITAL_COMMUNITY)
Admission: RE | Admit: 2012-09-01 | Discharge: 2012-09-01 | Disposition: A | Discharge status: Home / Self Care | Payer: 59 | Source: Ambulatory Visit | Attending: Orthopedic Surgery | Admitting: Orthopedic Surgery

## 2012-09-01 ENCOUNTER — Ambulatory Visit (HOSPITAL_COMMUNITY)
Admission: RE | Admit: 2012-09-01 | Discharge: 2012-09-01 | Disposition: A | Discharge status: Home / Self Care | Payer: 59 | Source: Ambulatory Visit | Attending: Orthopedic Surgery | Admitting: Orthopedic Surgery

## 2012-09-01 DIAGNOSIS — Z01818 Encounter for other preprocedural examination: Secondary | ICD-10-CM | POA: Insufficient documentation

## 2012-09-01 HISTORY — DX: Gastro-esophageal reflux disease without esophagitis: K21.9

## 2012-09-01 HISTORY — DX: Unspecified osteoarthritis, unspecified site: M19.90

## 2012-09-01 LAB — BASIC METABOLIC PANEL
BUN: 7 mg/dL (ref 6–23)
CO2: 24 mEq/L (ref 19–32)
Calcium: 9.2 mg/dL (ref 8.4–10.5)
Chloride: 106 mEq/L (ref 96–112)
Creatinine, Ser: 0.74 mg/dL (ref 0.50–1.10)
GFR calc Af Amer: >90 mL/min (ref >90)
GFR calc non Af Amer: >90 mL/min (ref >90)
Glucose, Bld: 85 mg/dL (ref 70–99)
Potassium: 3.8 mEq/L (ref 3.5–5.1)
Sodium: 139 mEq/L (ref 135–145)

## 2012-09-01 LAB — SURGICAL PCR SCREEN
MRSA, PCR: NEGATIVE
Staphylococcus aureus: POSITIVE — AB

## 2012-09-01 LAB — TYPE AND SCREEN
ABO/RH(D): B POS
Antibody Screen: NEGATIVE

## 2012-09-01 LAB — CBC
HCT: 37.1 % (ref 36.0–46.0)
Hemoglobin: 12.3 g/dL (ref 12.0–15.0)
MCH: 31.8 pg (ref 26.0–34.0)
MCHC: 33.2 g/dL (ref 30.0–36.0)
MCV: 95.9 fL (ref 78.0–100.0)
Platelets: 336 10*3/uL (ref 150–400)
RBC: 3.87 MIL/uL (ref 3.87–5.11)
RDW: 13.5 % (ref 11.5–15.5)
WBC: 8.8 10*3/uL (ref 4.0–10.5)

## 2012-09-01 LAB — HCG, SERUM, QUALITATIVE: Preg, Serum: NEGATIVE

## 2012-09-01 NOTE — Pre-Procedure Instructions (Signed)
20 Vanessa Payne  09/01/2012   Your procedure is scheduled on:  Wednesday, November 27  Report to [facility] at ? AM.(Call Short Stay Center at 0800 for admission time.  Call this number if you have problems the morning of surgery: [phone]   Remember:   Do not eat food or drink liquids:After Midnight.      Take these medicines the morning of surgery with A SIP OF WATER: Xanax, Oxycodone,gabapentin,Methocarbamol,Omeprazole,Effexor,Topamax. Fioricet   Do not wear jewelry, make-up or nail polish.  Do not wear lotions, powders, or perfumes. You may wear deodorant.  Do not shave 48 hours prior to surgery.   Do not bring valuables to the hospital.  Contacts, dentures or bridgework may not be worn into surgery.  Leave suitcase in the car. After surgery it may be brought to your room.  For patients admitted to the hospital, checkout time is 11:00 AM the day of discharge.   Special Instructions: Shower using CHG 2 nights before surgery and the night before surgery.  If you shower the day of surgery use CHG.  Use special wash - you have one bottle of CHG for all showers.  You should use approximately 1/3 of the bottle for each shower.   Please read over the following fact sheets that you were given: Pain Booklet, Coughing and Deep Breathing, Blood Transfusion Information, MRSA Information and Surgical Site Infection Prevention

## 2012-09-04 ENCOUNTER — Other Ambulatory Visit: Payer: Self-pay | Admitting: Physical Medicine and Rehabilitation

## 2012-09-06 MED ORDER — CEFAZOLIN SODIUM-DEXTROSE 2-3 GM-% IV SOLR
2.0000 g | Freq: Once | INTRAVENOUS | Status: AC
  Administered 2012-09-07: 1 g via INTRAVENOUS
  Administered 2012-09-07: 2 g via INTRAVENOUS
  Filled 2012-09-06: qty 50

## 2012-09-07 ENCOUNTER — Encounter (HOSPITAL_COMMUNITY): Payer: Self-pay | Admitting: *Deleted

## 2012-09-07 ENCOUNTER — Inpatient Hospital Stay (HOSPITAL_COMMUNITY)
Admission: RE | Admit: 2012-09-07 | Discharge: 2012-09-10 | DRG: 460 | Disposition: A | Discharge status: Home / Self Care | Payer: 59 | Source: Ambulatory Visit | Attending: Orthopedic Surgery | Admitting: Orthopedic Surgery

## 2012-09-07 ENCOUNTER — Ambulatory Visit (HOSPITAL_COMMUNITY): Payer: 59 | Admitting: *Deleted

## 2012-09-07 ENCOUNTER — Ambulatory Visit (HOSPITAL_COMMUNITY): Payer: 59

## 2012-09-07 ENCOUNTER — Encounter (HOSPITAL_COMMUNITY)
Admission: RE | Disposition: A | Discharge status: Home / Self Care | Payer: Self-pay | Source: Ambulatory Visit | Attending: Orthopedic Surgery

## 2012-09-07 DIAGNOSIS — M79609 Pain in unspecified limb: Secondary | ICD-10-CM | POA: Diagnosis present

## 2012-09-07 DIAGNOSIS — Z794 Long term (current) use of insulin: Secondary | ICD-10-CM

## 2012-09-07 DIAGNOSIS — Z23 Encounter for immunization: Secondary | ICD-10-CM

## 2012-09-07 DIAGNOSIS — K219 Gastro-esophageal reflux disease without esophagitis: Secondary | ICD-10-CM | POA: Diagnosis present

## 2012-09-07 DIAGNOSIS — Z79899 Other long term (current) drug therapy: Secondary | ICD-10-CM

## 2012-09-07 DIAGNOSIS — M47817 Spondylosis without myelopathy or radiculopathy, lumbosacral region: Principal | ICD-10-CM | POA: Diagnosis present

## 2012-09-07 DIAGNOSIS — F329 Major depressive disorder, single episode, unspecified: Secondary | ICD-10-CM | POA: Diagnosis present

## 2012-09-07 DIAGNOSIS — F17200 Nicotine dependence, unspecified, uncomplicated: Secondary | ICD-10-CM | POA: Diagnosis present

## 2012-09-07 DIAGNOSIS — M545 Low back pain, unspecified: Secondary | ICD-10-CM | POA: Diagnosis present

## 2012-09-07 DIAGNOSIS — K589 Irritable bowel syndrome without diarrhea: Secondary | ICD-10-CM | POA: Diagnosis present

## 2012-09-07 DIAGNOSIS — F411 Generalized anxiety disorder: Secondary | ICD-10-CM | POA: Diagnosis present

## 2012-09-07 DIAGNOSIS — Q762 Congenital spondylolisthesis: Secondary | ICD-10-CM

## 2012-09-07 DIAGNOSIS — F3289 Other specified depressive episodes: Secondary | ICD-10-CM | POA: Diagnosis present

## 2012-09-07 HISTORY — DX: Other chronic pain: G89.29

## 2012-09-07 HISTORY — DX: Low back pain: M54.5

## 2012-09-07 HISTORY — DX: Low back pain, unspecified: M54.50

## 2012-09-07 HISTORY — DX: Residual hemorrhoidal skin tags: K64.4

## 2012-09-07 HISTORY — DX: Adverse effect of unspecified anesthetic, initial encounter: T41.45XA

## 2012-09-07 HISTORY — PX: POSTERIOR FUSION LUMBAR SPINE: SUR632

## 2012-09-07 HISTORY — DX: Other forms of dyspnea: R06.09

## 2012-09-07 HISTORY — DX: Dyspnea, unspecified: R06.00

## 2012-09-07 SURGERY — POSTERIOR LUMBAR FUSION 1 LEVEL
Anesthesia: General | Site: Back | Wound class: Clean

## 2012-09-07 MED ORDER — FLEET ENEMA 7-19 GM/118ML RE ENEM: 1.0000 | ENEMA | Freq: Once | RECTAL | Status: AC | PRN

## 2012-09-07 MED ORDER — LACTATED RINGERS IV SOLN: INTRAVENOUS | Status: DC | PRN

## 2012-09-07 MED ORDER — DIPHENHYDRAMINE HCL 12.5 MG/5ML PO ELIX: 12.5000 mg | ORAL_SOLUTION | Freq: Four times a day (QID) | ORAL | Status: DC | PRN

## 2012-09-07 MED ORDER — ZOLPIDEM TARTRATE 5 MG PO TABS
5.0000 mg | ORAL_TABLET | Freq: Every evening | ORAL | Status: DC | PRN
  Administered 2012-09-08 – 2012-09-09 (×3): 5 mg via ORAL
  Filled 2012-09-07 (×3): qty 1

## 2012-09-07 MED ORDER — SODIUM CHLORIDE 0.9 % IJ SOLN: 9.0000 mL | INTRAMUSCULAR | Status: DC | PRN

## 2012-09-07 MED ORDER — GABAPENTIN 100 MG PO CAPS
100.0000 mg | ORAL_CAPSULE | Freq: Three times a day (TID) | ORAL | Status: DC
  Administered 2012-09-08 – 2012-09-09 (×5): 100 mg via ORAL
  Filled 2012-09-07 (×7): qty 1

## 2012-09-07 MED ORDER — THROMBIN 20000 UNITS EX SOLR
CUTANEOUS | Status: AC
  Filled 2012-09-07: qty 20000

## 2012-09-07 MED ORDER — DEXAMETHASONE SODIUM PHOSPHATE 10 MG/ML IJ SOLN
10.0000 mg | Freq: Once | INTRAMUSCULAR | Status: AC
  Administered 2012-09-07: 10 mg via INTRAVENOUS

## 2012-09-07 MED ORDER — ROCURONIUM BROMIDE 100 MG/10ML IV SOLN
INTRAVENOUS | Status: DC | PRN
  Administered 2012-09-07: 40 mg via INTRAVENOUS

## 2012-09-07 MED ORDER — DOCUSATE SODIUM 100 MG PO CAPS
100.0000 mg | ORAL_CAPSULE | Freq: Two times a day (BID) | ORAL | Status: DC
  Administered 2012-09-08 – 2012-09-10 (×6): 100 mg via ORAL
  Filled 2012-09-07 (×7): qty 1

## 2012-09-07 MED ORDER — DEXAMETHASONE 4 MG PO TABS
4.0000 mg | ORAL_TABLET | Freq: Four times a day (QID) | ORAL | Status: DC
  Administered 2012-09-08 – 2012-09-10 (×10): 4 mg via ORAL
  Filled 2012-09-07 (×14): qty 1

## 2012-09-07 MED ORDER — METHOCARBAMOL 100 MG/ML IJ SOLN
500.0000 mg | Freq: Four times a day (QID) | INTRAVENOUS | Status: DC | PRN
  Filled 2012-09-07: qty 5

## 2012-09-07 MED ORDER — LIDOCAINE HCL (CARDIAC) 20 MG/ML IV SOLN
INTRAVENOUS | Status: DC | PRN
  Administered 2012-09-07: 30 mg via INTRAVENOUS

## 2012-09-07 MED ORDER — OXYCODONE HCL 5 MG PO TABS
10.0000 mg | ORAL_TABLET | ORAL | Status: DC | PRN
  Administered 2012-09-08 – 2012-09-09 (×6): 10 mg via ORAL
  Filled 2012-09-07 (×6): qty 2

## 2012-09-07 MED ORDER — SODIUM CHLORIDE 0.9 % IJ SOLN: 3.0000 mL | INTRAMUSCULAR | Status: DC | PRN

## 2012-09-07 MED ORDER — HYDROMORPHONE HCL PF 1 MG/ML IJ SOLN
0.2500 mg | INTRAMUSCULAR | Status: DC | PRN
  Administered 2012-09-07 (×2): 0.5 mg via INTRAVENOUS

## 2012-09-07 MED ORDER — BUPIVACAINE-EPINEPHRINE PF 0.25-1:200000 % IJ SOLN
INTRAMUSCULAR | Status: AC
  Filled 2012-09-07: qty 30

## 2012-09-07 MED ORDER — NALOXONE HCL 0.4 MG/ML IJ SOLN: 0.4000 mg | INTRAMUSCULAR | Status: DC | PRN

## 2012-09-07 MED ORDER — TOPIRAMATE 100 MG PO TABS
100.0000 mg | ORAL_TABLET | Freq: Every day | ORAL | Status: DC
  Administered 2012-09-08 – 2012-09-10 (×3): 100 mg via ORAL
  Filled 2012-09-07 (×3): qty 1

## 2012-09-07 MED ORDER — ONDANSETRON HCL 4 MG/2ML IJ SOLN: 4.0000 mg | Freq: Four times a day (QID) | INTRAMUSCULAR | Status: DC | PRN

## 2012-09-07 MED ORDER — OXYCODONE HCL 5 MG/5ML PO SOLN: 5.0000 mg | Freq: Once | ORAL | Status: DC | PRN

## 2012-09-07 MED ORDER — ONDANSETRON HCL 4 MG/2ML IJ SOLN: 4.0000 mg | INTRAMUSCULAR | Status: DC | PRN

## 2012-09-07 MED ORDER — OXYCODONE HCL 5 MG PO TABS: 5.0000 mg | ORAL_TABLET | Freq: Once | ORAL | Status: DC | PRN

## 2012-09-07 MED ORDER — PROPOFOL 10 MG/ML IV BOLUS
INTRAVENOUS | Status: DC | PRN
  Administered 2012-09-07: 140 mg via INTRAVENOUS

## 2012-09-07 MED ORDER — CEFAZOLIN SODIUM 1-5 GM-% IV SOLN
1.0000 g | Freq: Three times a day (TID) | INTRAVENOUS | Status: AC
  Administered 2012-09-08 (×2): 1 g via INTRAVENOUS
  Filled 2012-09-07 (×2): qty 50

## 2012-09-07 MED ORDER — 0.9 % SODIUM CHLORIDE (POUR BTL) OPTIME
TOPICAL | Status: DC | PRN
  Administered 2012-09-07 (×2): 1000 mL

## 2012-09-07 MED ORDER — ACETAMINOPHEN 10 MG/ML IV SOLN
1000.0000 mg | Freq: Once | INTRAVENOUS | Status: AC
  Administered 2012-09-07: 1000 mg via INTRAVENOUS

## 2012-09-07 MED ORDER — SUFENTANIL CITRATE 50 MCG/ML IV SOLN
INTRAVENOUS | Status: DC | PRN
  Administered 2012-09-07: 20 ug via INTRAVENOUS
  Administered 2012-09-07: 10 ug via INTRAVENOUS

## 2012-09-07 MED ORDER — DIPHENHYDRAMINE HCL 50 MG/ML IJ SOLN: 12.5000 mg | Freq: Four times a day (QID) | INTRAMUSCULAR | Status: DC | PRN

## 2012-09-07 MED ORDER — ONDANSETRON HCL 4 MG/2ML IJ SOLN
INTRAMUSCULAR | Status: DC | PRN
  Administered 2012-09-07: 4 mg via INTRAVENOUS

## 2012-09-07 MED ORDER — CEFAZOLIN SODIUM 1-5 GM-% IV SOLN
INTRAVENOUS | Status: AC
  Filled 2012-09-07: qty 50

## 2012-09-07 MED ORDER — DEXAMETHASONE SODIUM PHOSPHATE 4 MG/ML IJ SOLN
4.0000 mg | Freq: Four times a day (QID) | INTRAMUSCULAR | Status: DC
  Filled 2012-09-07 (×14): qty 1

## 2012-09-07 MED ORDER — MIDAZOLAM HCL 5 MG/5ML IJ SOLN
INTRAMUSCULAR | Status: DC | PRN
  Administered 2012-09-07: 2 mg via INTRAVENOUS

## 2012-09-07 MED ORDER — PANTOPRAZOLE SODIUM 40 MG PO TBEC
40.0000 mg | DELAYED_RELEASE_TABLET | Freq: Every day | ORAL | Status: DC
  Administered 2012-09-08 – 2012-09-10 (×3): 40 mg via ORAL
  Filled 2012-09-07 (×3): qty 1

## 2012-09-07 MED ORDER — HEMOSTATIC AGENTS (NO CHARGE) OPTIME
TOPICAL | Status: DC | PRN
  Administered 2012-09-07: 1 via TOPICAL

## 2012-09-07 MED ORDER — LACTATED RINGERS IV SOLN
INTRAVENOUS | Status: DC
Start: 2012-09-07 — End: 2012-09-10

## 2012-09-07 MED ORDER — VENLAFAXINE HCL 75 MG PO TABS
225.0000 mg | ORAL_TABLET | Freq: Every morning | ORAL | Status: DC
  Administered 2012-09-08 – 2012-09-10 (×3): 225 mg via ORAL
  Filled 2012-09-07 (×3): qty 3

## 2012-09-07 MED ORDER — ACETAMINOPHEN 10 MG/ML IV SOLN
1000.0000 mg | Freq: Four times a day (QID) | INTRAVENOUS | Status: DC
  Administered 2012-09-08 (×3): 1000 mg via INTRAVENOUS
  Filled 2012-09-07 (×4): qty 100

## 2012-09-07 MED ORDER — HYDROMORPHONE HCL PF 1 MG/ML IJ SOLN
INTRAMUSCULAR | Status: AC
  Filled 2012-09-07: qty 1

## 2012-09-07 MED ORDER — DEXAMETHASONE SODIUM PHOSPHATE 10 MG/ML IJ SOLN
10.0000 mg | Freq: Once | INTRAMUSCULAR | Status: DC
  Filled 2012-09-07: qty 1

## 2012-09-07 MED ORDER — ONDANSETRON HCL 4 MG/2ML IJ SOLN: 4.0000 mg | Freq: Once | INTRAMUSCULAR | Status: DC | PRN

## 2012-09-07 MED ORDER — HYDROMORPHONE 0.3 MG/ML IV SOLN
INTRAVENOUS | Status: AC
  Filled 2012-09-07: qty 25

## 2012-09-07 MED ORDER — MENTHOL 3 MG MT LOZG: 1.0000 | LOZENGE | OROMUCOSAL | Status: DC | PRN

## 2012-09-07 MED ORDER — PHENOL 1.4 % MT LIQD: 1.0000 | OROMUCOSAL | Status: DC | PRN

## 2012-09-07 MED ORDER — GLYCOPYRROLATE 0.2 MG/ML IJ SOLN
INTRAMUSCULAR | Status: DC | PRN
  Administered 2012-09-07: 0.4 mg via INTRAVENOUS

## 2012-09-07 MED ORDER — HYDROMORPHONE 0.3 MG/ML IV SOLN
INTRAVENOUS | Status: DC
  Administered 2012-09-07: 22:00:00 via INTRAVENOUS
  Administered 2012-09-08: 1.59 mg via INTRAVENOUS
  Administered 2012-09-08: 2.39 mg via INTRAVENOUS

## 2012-09-07 MED ORDER — ALBUTEROL SULFATE HFA 108 (90 BASE) MCG/ACT IN AERS
2.0000 | INHALATION_SPRAY | Freq: Four times a day (QID) | RESPIRATORY_TRACT | Status: DC
  Administered 2012-09-08: 2 via RESPIRATORY_TRACT
  Filled 2012-09-07: qty 6.7

## 2012-09-07 MED ORDER — PROPOFOL INFUSION 10 MG/ML OPTIME
INTRAVENOUS | Status: DC | PRN
  Administered 2012-09-07: 75 ug/kg/min via INTRAVENOUS

## 2012-09-07 MED ORDER — LACTATED RINGERS IV SOLN
INTRAVENOUS | Status: DC
  Administered 2012-09-07: 19:00:00 via INTRAVENOUS
  Administered 2012-09-07: 50 mL/h via INTRAVENOUS
  Administered 2012-09-08: 09:00:00 via INTRAVENOUS

## 2012-09-07 MED ORDER — SODIUM CHLORIDE 0.9 % IJ SOLN
3.0000 mL | Freq: Two times a day (BID) | INTRAMUSCULAR | Status: DC
  Administered 2012-09-08: 3 mL via INTRAVENOUS

## 2012-09-07 MED ORDER — METHOCARBAMOL 500 MG PO TABS
500.0000 mg | ORAL_TABLET | Freq: Four times a day (QID) | ORAL | Status: DC | PRN
  Administered 2012-09-08 – 2012-09-09 (×4): 500 mg via ORAL
  Filled 2012-09-07 (×4): qty 1

## 2012-09-07 MED ORDER — NEOSTIGMINE METHYLSULFATE 1 MG/ML IJ SOLN
INTRAMUSCULAR | Status: DC | PRN
  Administered 2012-09-07: 3 mg via INTRAVENOUS

## 2012-09-07 MED ORDER — THROMBIN 20000 UNITS EX SOLR
CUTANEOUS | Status: DC | PRN
  Administered 2012-09-07: 18:00:00 via TOPICAL

## 2012-09-07 MED ORDER — ALPRAZOLAM 0.5 MG PO TABS
0.5000 mg | ORAL_TABLET | Freq: Two times a day (BID) | ORAL | Status: DC | PRN
Start: 2012-09-07 — End: 2012-09-10
  Administered 2012-09-08: 0.5 mg via ORAL
  Filled 2012-09-07: qty 1

## 2012-09-07 MED ORDER — SODIUM CHLORIDE 0.9 % IV SOLN: 250.0000 mL | INTRAVENOUS | Status: DC

## 2012-09-07 MED ORDER — BUPIVACAINE-EPINEPHRINE 0.25% -1:200000 IJ SOLN
INTRAMUSCULAR | Status: DC | PRN
  Administered 2012-09-07: 10 mL

## 2012-09-07 MED ORDER — ACETAMINOPHEN 10 MG/ML IV SOLN
INTRAVENOUS | Status: AC
  Filled 2012-09-07: qty 100

## 2012-09-07 MED ORDER — ACETAMINOPHEN 10 MG/ML IV SOLN
1000.0000 mg | Freq: Once | INTRAVENOUS | Status: DC
  Filled 2012-09-07: qty 100

## 2012-09-07 MED ORDER — FENTANYL CITRATE 0.05 MG/ML IJ SOLN
INTRAMUSCULAR | Status: DC | PRN
  Administered 2012-09-07: 100 ug via INTRAVENOUS
  Administered 2012-09-07: 50 ug via INTRAVENOUS
  Administered 2012-09-07: 100 ug via INTRAVENOUS

## 2012-09-07 SURGICAL SUPPLY — 74 items
45mnm Matrix MIS Rod ×1 IMPLANT
ADH SKN CLS APL DERMABOND .7 (GAUZE/BANDAGES/DRESSINGS) ×1
BLADE SURG ROTATE 9660 (MISCELLANEOUS) IMPLANT
BUR EGG ELITE 4.0 (BURR) IMPLANT
CLOTH BEACON ORANGE TIMEOUT ST (SAFETY) ×2 IMPLANT
CORDS BIPOLAR (ELECTRODE) ×2 IMPLANT
COVER MAYO STAND STRL (DRAPES) ×4 IMPLANT
COVER SURGICAL LIGHT HANDLE (MISCELLANEOUS) ×2 IMPLANT
DERMABOND ADVANCED (GAUZE/BANDAGES/DRESSINGS) ×1
DERMABOND ADVANCED .7 DNX12 (GAUZE/BANDAGES/DRESSINGS) ×1 IMPLANT
DRAPE C-ARM 42X72 X-RAY (DRAPES) ×2 IMPLANT
DRAPE ORTHO SPLIT 77X108 STRL (DRAPES) ×2
DRAPE POUCH INSTRU U-SHP 10X18 (DRAPES) ×2 IMPLANT
DRAPE SURG 17X23 STRL (DRAPES) ×2 IMPLANT
DRAPE SURG ORHT 6 SPLT 77X108 (DRAPES) ×1 IMPLANT
DRAPE U-SHAPE 47X51 STRL (DRAPES) ×2 IMPLANT
DRSG MEPILEX BORDER 4X4 (GAUZE/BANDAGES/DRESSINGS) ×4 IMPLANT
DRSG MEPILEX BORDER 4X8 (GAUZE/BANDAGES/DRESSINGS) ×2 IMPLANT
DURAPREP 26ML APPLICATOR (WOUND CARE) ×2 IMPLANT
ELECT BLADE 4.0 EZ CLEAN MEGAD (MISCELLANEOUS) ×2
ELECT BLADE 6.5 EXT (BLADE) ×1 IMPLANT
ELECT REM PT RETURN 9FT ADLT (ELECTROSURGICAL) ×2
ELECTRODE BLDE 4.0 EZ CLN MEGD (MISCELLANEOUS) ×1 IMPLANT
ELECTRODE REM PT RTRN 9FT ADLT (ELECTROSURGICAL) ×1 IMPLANT
GLOVE BIOGEL PI IND STRL 6.5 (GLOVE) ×1 IMPLANT
GLOVE BIOGEL PI IND STRL 8.5 (GLOVE) ×1 IMPLANT
GLOVE BIOGEL PI INDICATOR 6.5 (GLOVE) ×1
GLOVE BIOGEL PI INDICATOR 8.5 (GLOVE) ×1
GLOVE ECLIPSE 6.0 STRL STRAW (GLOVE) ×2 IMPLANT
GLOVE ECLIPSE 8.5 STRL (GLOVE) ×2 IMPLANT
GOWN PREVENTION PLUS XXLARGE (GOWN DISPOSABLE) ×2 IMPLANT
GOWN STRL NON-REIN LRG LVL3 (GOWN DISPOSABLE) ×4 IMPLANT
GUIDEWIRE 1.6X480MM (WIRE) ×4 IMPLANT
IV CATH 14GX2 1/4 (CATHETERS) IMPLANT
KIT BASIN OR (CUSTOM PROCEDURE TRAY) ×2 IMPLANT
KIT POSITION SURG JACKSON T1 (MISCELLANEOUS) ×2 IMPLANT
KIT ROOM TURNOVER OR (KITS) ×2 IMPLANT
MIX DBX 10CC 35% BONE (Bone Implant) ×2 IMPLANT
Matrix Locking Cap ×8 IMPLANT
NDL I-PASS III (NEEDLE) IMPLANT
NDL SPNL 18GX3.5 QUINCKE PK (NEEDLE) ×1 IMPLANT
NEEDLE 22X1 1/2 (OR ONLY) (NEEDLE) ×2 IMPLANT
NEEDLE I-PASS III (NEEDLE) ×2 IMPLANT
NEEDLE SPNL 18GX3.5 QUINCKE PK (NEEDLE) ×2 IMPLANT
NS IRRIG 1000ML POUR BTL (IV SOLUTION) ×2 IMPLANT
ORACLE NEUROMONITORING KIT ×2 IMPLANT
PACK LAMINECTOMY ORTHO (CUSTOM PROCEDURE TRAY) ×2 IMPLANT
PACK UNIVERSAL I (CUSTOM PROCEDURE TRAY) ×2 IMPLANT
PAD ARMBOARD 7.5X6 YLW CONV (MISCELLANEOUS) ×4 IMPLANT
PATTIES SURGICAL .5 X.5 (GAUZE/BANDAGES/DRESSINGS) ×2 IMPLANT
PATTIES SURGICAL .5 X1 (DISPOSABLE) ×4 IMPLANT
ROD 40MM (Rod) ×2 IMPLANT
ROD SPNL CVD 40X5.5XHRD NS (Rod) ×1 IMPLANT
SCREW MATRIX MIS 6.0X35MM (Screw) ×2 IMPLANT
SCREW MATRIX MIS 6.0X40MM (Screw) ×4 IMPLANT
SPACER OPAL 10X24 12MM (Spacer) ×2 IMPLANT
SPONGE LAP 4X18 X RAY DECT (DISPOSABLE) ×4 IMPLANT
SPONGE SURGIFOAM ABS GEL 100 (HEMOSTASIS) ×2 IMPLANT
STRIP CLOSURE SKIN 1/2X4 (GAUZE/BANDAGES/DRESSINGS) ×2 IMPLANT
SURGIFLO TRUKIT (HEMOSTASIS) IMPLANT
SUT MNCRL AB 3-0 PS2 18 (SUTURE) ×4 IMPLANT
SUT VIC AB 1 CT1 27 (SUTURE) ×2
SUT VIC AB 1 CT1 27XBRD ANBCTR (SUTURE) ×1 IMPLANT
SUT VIC AB 2-0 CT1 18 (SUTURE) ×4 IMPLANT
SUT VICRYL 0 UR6 27IN ABS (SUTURE) ×4 IMPLANT
SYR BULB IRRIGATION 50ML (SYRINGE) ×2 IMPLANT
SYR CONTROL 10ML LL (SYRINGE) ×2 IMPLANT
SYRINGE 10CC LL (SYRINGE) ×1 IMPLANT
TAP CANN 5MM (TAP) ×2 IMPLANT
TOWEL OR 17X24 6PK STRL BLUE (TOWEL DISPOSABLE) ×2 IMPLANT
TOWEL OR 17X26 10 PK STRL BLUE (TOWEL DISPOSABLE) ×2 IMPLANT
TRAY FOLEY CATH 14FR (SET/KITS/TRAYS/PACK) ×2 IMPLANT
WATER STERILE IRR 1000ML POUR (IV SOLUTION) ×1 IMPLANT
YANKAUER SUCT BULB TIP NO VENT (SUCTIONS) ×4 IMPLANT

## 2012-09-07 NOTE — Anesthesia Postprocedure Evaluation (Signed)
  Anesthesia Post-op Note  Patient: Vanessa Payne  Procedure(s) Performed: Procedure(s) (LRB) with comments: POSTERIOR LUMBAR FUSION 1 LEVEL (N/A) - Transverse Lumbar Five-Sacral One DECOMPRESSION AND FUSION     Patient Location: PACU  Anesthesia Type:General  Level of Consciousness: awake, alert  and oriented  Airway and Oxygen Therapy: Patient Spontanous Breathing and Patient connected to nasal cannula oxygen  Post-op Pain: moderate  Post-op Assessment: Post-op Vital signs reviewed  Post-op Vital Signs: Reviewed  Complications: No apparent anesthesia complications

## 2012-09-07 NOTE — Anesthesia Procedure Notes (Signed)
Procedure Name: Intubation Date/Time: 09/07/2012 4:54 PM Performed by: Rheda Kassab S Pre-anesthesia Checklist: Patient identified, Timeout performed, Emergency Drugs available, Suction available and Patient being monitored Patient Re-evaluated:Patient Re-evaluated prior to inductionOxygen Delivery Method: Circle system utilized Preoxygenation: Pre-oxygenation with 100% oxygen Intubation Type: IV induction Laryngoscope Size: Mac and 3 Grade View: Grade I Tube type: Oral Tube size: 7.5 mm Number of attempts: 1 Airway Equipment and Method: Stylet Placement Confirmation: ETT inserted through vocal cords under direct vision,  breath sounds checked- equal and bilateral and positive ETCO2 Secured at: 20 cm Tube secured with: Tape Dental Injury: Teeth and Oropharynx as per pre-operative assessment

## 2012-09-07 NOTE — H&P (Signed)
H+P reviewed No change in clinical exam Plan on TLIF L5/S1

## 2012-09-07 NOTE — Brief Op Note (Signed)
09/07/2012  8:30 PM  PATIENT:  Vanessa Payne  41 y.o. female  PRE-OPERATIVE DIAGNOSIS:  L5-S1 ISTHMIC SLIP WITH RIGHT RADICULAR PAIN   POST-OPERATIVE DIAGNOSIS:  L5-ISTHMIC SLIP WITH RIGHT RADICULAR PAIN   PROCEDURE:  Procedure(s) (LRB) with comments: POSTERIOR LUMBAR FUSION 1 LEVEL (N/A) - Transverse Lumbar Five-Sacral One DECOMPRESSION AND FUSION     SURGEON:  Surgeon(s) and Role:    * Venita Lick, MD - Primary  PHYSICIAN ASSISTANT:   ASSISTANTS: none   ANESTHESIA:   general  EBL:  Total I/O In: 500 [I.V.:500] Out: 200 [Urine:200]  BLOOD ADMINISTERED:none  DRAINS: none   LOCAL MEDICATIONS USED:  MARCAINE     SPECIMEN:  No Specimen  DISPOSITION OF SPECIMEN:  N/A  COUNTS:  YES  TOURNIQUET:  * No tourniquets in log *  DICTATION: .Other Dictation: Dictation Number [id number]  PLAN OF CARE: Admit to inpatient   PATIENT DISPOSITION:  PACU - hemodynamically stable.

## 2012-09-07 NOTE — Anesthesia Preprocedure Evaluation (Addendum)
Anesthesia Evaluation  Patient identified by MRN, date of birth, ID band Patient awake    Reviewed: Allergy & Precautions, H&P , NPO status , Patient's Chart, lab work & pertinent test results  Airway Mallampati: II TM Distance: >3 FB Neck ROM: full    Dental No notable dental hx. (+) Edentulous Upper and Dental Advidsory Given   Pulmonary neg pulmonary ROS, shortness of breath and with exertion,  breath sounds clear to auscultation  Pulmonary exam normal       Cardiovascular negative cardio ROS  Rhythm:Regular Rate:Normal     Neuro/Psych  Headaches, PSYCHIATRIC DISORDERS negative neurological ROS  negative psych ROS   GI/Hepatic negative GI ROS, Neg liver ROS, GERD-  Medicated and Controlled,  Endo/Other  negative endocrine ROS  Renal/GU negative Renal ROS  negative genitourinary   Musculoskeletal   Abdominal   Peds  Hematology negative hematology ROS (+)   Anesthesia Other Findings   Reproductive/Obstetrics negative OB ROS                          Anesthesia Physical Anesthesia Plan  ASA: III  Anesthesia Plan: General   Post-op Pain Management:    Induction: Intravenous  Airway Management Planned: Oral ETT  Additional Equipment:   Intra-op Plan:   Post-operative Plan: Extubation in OR  Informed Consent: I have reviewed the patients History and Physical, chart, labs and discussed the procedure including the risks, benefits and alternatives for the proposed anesthesia with the patient or authorized representative who has indicated his/her understanding and acceptance.   Dental Advisory Given and Dental advisory given  Plan Discussed with: CRNA and Surgeon  Anesthesia Plan Comments:        Anesthesia Quick Evaluation

## 2012-09-07 NOTE — Transfer of Care (Signed)
Immediate Anesthesia Transfer of Care Note  Patient: Vanessa Payne  Procedure(s) Performed: Procedure(s) (LRB) with comments: POSTERIOR LUMBAR FUSION 1 LEVEL (N/A) - Transverse Lumbar Five-Sacral One DECOMPRESSION AND FUSION     Patient Location: PACU  Anesthesia Type:General  Level of Consciousness: awake, alert  and oriented  Airway & Oxygen Therapy: Patient Spontanous Breathing and Patient connected to nasal cannula oxygen  Post-op Assessment: Report given to PACU RN and Post -op Vital signs reviewed and stable  Post vital signs: Reviewed and stable  Complications: No apparent anesthesia complications

## 2012-09-07 NOTE — Preoperative (Signed)
Beta Blockers   Reason not to administer Beta Blockers:Not Applicable 

## 2012-09-08 ENCOUNTER — Encounter (HOSPITAL_COMMUNITY): Payer: Self-pay | Admitting: General Practice

## 2012-09-08 ENCOUNTER — Inpatient Hospital Stay (HOSPITAL_COMMUNITY): Payer: 59

## 2012-09-08 MED ORDER — DOCUSATE SODIUM 100 MG PO CAPS: 100.0000 mg | ORAL_CAPSULE | Freq: Two times a day (BID) | ORAL | Status: DC

## 2012-09-08 MED ORDER — ALBUTEROL SULFATE HFA 108 (90 BASE) MCG/ACT IN AERS
2.0000 | INHALATION_SPRAY | Freq: Two times a day (BID) | RESPIRATORY_TRACT | Status: DC
  Administered 2012-09-08 – 2012-09-09 (×2): 2 via RESPIRATORY_TRACT
  Filled 2012-09-08: qty 6.7

## 2012-09-08 MED ORDER — METHOCARBAMOL 500 MG PO TABS: 500.0000 mg | ORAL_TABLET | Freq: Three times a day (TID) | ORAL | Status: DC | PRN

## 2012-09-08 MED ORDER — BUTALBITAL-APAP-CAFFEINE 50-325-40 MG PO TABS
1.0000 | ORAL_TABLET | ORAL | Status: DC | PRN
  Administered 2012-09-08 – 2012-09-09 (×4): 1 via ORAL
  Filled 2012-09-08 (×4): qty 1

## 2012-09-08 MED ORDER — ONDANSETRON HCL 4 MG PO TABS: 4.0000 mg | ORAL_TABLET | Freq: Three times a day (TID) | ORAL | Status: DC | PRN

## 2012-09-08 MED ORDER — HYDROMORPHONE HCL PF 1 MG/ML IJ SOLN
1.0000 mg | INTRAMUSCULAR | Status: DC | PRN
  Administered 2012-09-08 (×2): 1 mg via INTRAVENOUS
  Filled 2012-09-08 (×2): qty 1

## 2012-09-08 MED ORDER — OXYCODONE-ACETAMINOPHEN 10-325 MG PO TABS: 1.0000 | ORAL_TABLET | Freq: Four times a day (QID) | ORAL | Status: DC | PRN

## 2012-09-08 MED ORDER — PNEUMOCOCCAL VAC POLYVALENT 25 MCG/0.5ML IJ INJ
0.5000 mL | INJECTION | INTRAMUSCULAR | Status: AC
  Administered 2012-09-09: 0.5 mL via INTRAMUSCULAR
  Filled 2012-09-08: qty 0.5

## 2012-09-08 MED ORDER — ACETAMINOPHEN 10 MG/ML IV SOLN
1000.0000 mg | Freq: Once | INTRAVENOUS | Status: AC
  Administered 2012-09-09: 1000 mg via INTRAVENOUS
  Filled 2012-09-08: qty 100

## 2012-09-08 NOTE — Progress Notes (Addendum)
Orthopedics Progress Note  Subjective: Pt sitting up doing well c/o soreness to lumbar spine C/o early migraine headache Denies any other symptoms  Objective:  Filed Vitals:   09/08/12 0600  BP: 113/67  Pulse: 75  Temp: 97.6 F (36.4 C)  Resp: 16    General: Awake and alert  Musculoskeletal: lumbar incision healing well, nv intact distally, no erythema or drainage Neurovascularly intact  Lab Results  Component Value Date   WBC 8.8 09/01/2012   HGB 12.3 09/01/2012   HCT 37.1 09/01/2012   MCV 95.9 09/01/2012   PLT 336 09/01/2012       Component Value Date/Time   NA 139 09/01/2012 1604   K 3.8 09/01/2012 1604   CL 106 09/01/2012 1604   CO2 24 09/01/2012 1604   GLUCOSE 85 09/01/2012 1604   BUN 7 09/01/2012 1604   CREATININE 0.74 09/01/2012 1604   CALCIUM 9.2 09/01/2012 1604   GFRNONAA >90 09/01/2012 1604   GFRAA >90 09/01/2012 1604    No results found for this basename: INR, PROTIME    Assessment/Plan: POD #1 s/p Procedure(s): POSTERIOR LUMBAR FUSION 1 LEVEL  PT/OT Pain management Hopeful d/c home tomorrow  Almedia Balls. Ranell Patrick, MD 09/08/2012 9:38 AM    xrays reviewd - hardware satisfactory (s/p TLIF)

## 2012-09-08 NOTE — Evaluation (Signed)
Physical Therapy Evaluation Patient Details Name: Vanessa Payne MRN: [id number] DOB: 1971-05-24 Today's Date: 09/08/2012 Time: 7829-5621 PT Time Calculation (min): 20 min  PT Assessment / Plan / Recommendation Clinical Impression  Pt is a pleasent and cooperative 41 y.o. female s/p spinal sx. Pt demonstrates some deficits in functional mobility secondary to pain, weakness, and decreased activity tolerance. Pt will benefit from continued skilled PT to address deficits and maximize independence for discharge.    PT Assessment  Patient needs continued PT services    Follow Up Recommendations  No PT follow up    Does the patient have the potential to tolerate intense rehabilitation      Barriers to Discharge        Equipment Recommendations  Rolling walker with 5" wheels    Recommendations for Other Services     Frequency Min 3X/week    Precautions / Restrictions Precautions Precautions: Back Required Braces or Orthoses: Spinal Brace Spinal Brace: Applied in supine position   Pertinent Vitals/Pain 9/10      Mobility  Bed Mobility Bed Mobility: Rolling Left;Rolling Right;Right Sidelying to Sit;Left Sidelying to Sit Rolling Right: 6: Modified independent (Device/Increase time);With rail Rolling Left: 6: Modified independent (Device/Increase time);With rail Right Sidelying to Sit: 6: Modified independent (Device/Increase time);HOB elevated;With rails Left Sidelying to Sit: 6: Modified independent (Device/Increase time);With rails;HOB elevated Details for Bed Mobility Assistance: vc for log rolling technique Transfers Transfers: Sit to Stand;Stand to Sit Sit to Stand: 4: Min guard;From bed;From toilet Stand to Sit: 4: Min guard;To bed;To toilet Details for Transfer Assistance: VC's for hand placement on bed Ambulation/Gait Ambulation/Gait Assistance: 4: Min guard Ambulation Distance (Feet): 20 Feet Assistive device: None (pt may benefit from rw) Ambulation/Gait  Assistance Details: pt steady but very guarded; may benefit from rw to assist with pressure relief during ambulation Gait Pattern: Decreased stride length;Narrow base of support Gait velocity: decreased Stairs: No    Shoulder Instructions     Exercises     PT Diagnosis: Difficulty walking;Acute pain  PT Problem List: Decreased strength;Decreased range of motion;Decreased activity tolerance;Decreased mobility;Pain PT Treatment Interventions: Gait training;Stair training;Therapeutic activities;Functional mobility training;Therapeutic exercise;Patient/family education   PT Goals Acute Rehab PT Goals PT Goal Formulation: With patient Time For Goal Achievement: 09/14/12 Potential to Achieve Goals: Good Pt will Ambulate: >150 feet;with modified independence;with rolling walker PT Goal: Ambulate - Progress: Goal set today Pt will Go Up / Down Stairs: 1-2 stairs;with rolling walker PT Goal: Up/Down Stairs - Progress: Goal set today  Visit Information  Last PT Received On: 09/08/12 Assistance Needed: +1    Subjective Data      Prior Functioning  Home Living Lives With: Spouse;Family Available Help at Discharge: Family;Available 24 hours/day Type of Home: House Home Access: Stairs to enter [employer] of Steps: 1 Entrance Stairs-Rails: None Home Layout: One level Bathroom Shower/Tub: Walk-in shower;Door [employer] Toilet: Standard Bathroom Accessibility: Yes How Accessible: Accessible via walker Home Adaptive Equipment: Built-in shower seat;Walker - standard;Straight cane Prior Function Level of Independence: Independent Able to Take Stairs?: Reciprically Driving: Yes Vocation: Full time employment 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 Communication Communication: No difficulties Dominant Hand: Right    Cognition  Overall Cognitive Status: Appears within functional limits for tasks assessed/performed Arousal/Alertness:  Awake/alert Orientation Level: Appears intact for tasks assessed Behavior During Session: [facility] for tasks performed    Extremity/Trunk Assessment Right Upper Extremity Assessment RUE ROM/Strength/Tone: [facility] for tasks assessed RUE Sensation: WFL - Light Touch RUE Coordination: WFL -  gross/fine motor Left Upper Extremity Assessment LUE ROM/Strength/Tone: WFL for tasks assessed LUE Sensation: WFL - Light Touch LUE Coordination: WFL - gross/fine motor Right Lower Extremity Assessment RLE ROM/Strength/Tone: Within functional levels Left Lower Extremity Assessment LLE ROM/Strength/Tone: Within functional levels   Balance    End of Session PT - End of Session Equipment Utilized During Treatment: Gait belt;Back brace Activity Tolerance: Patient limited by pain;Patient limited by fatigue Patient left: in bed;with call bell/phone within reach Nurse Communication: Mobility status  GP     Fabio Asa 09/08/2012, 1:30 PM  Charlotte Crumb, PT DPT  [phone]

## 2012-09-08 NOTE — Progress Notes (Signed)
Orthopedic Tech Progress Note Patient Details:  Vanessa Payne [birth year] [id number] Went to patient's room to check for brace and patient already had lumbar corsett. Patient stated she was fitted for brace last week before surgery. Patient is wearing brace. Patient ID: Vanessa Payne, female   DOB: 03-10-1971, 41 y.o.   MRN: [id number]   [name] 09/08/2012, 11:33 AM
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
## 2012-09-08 NOTE — Op Note (Signed)
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  OPERATIVE PROCEDURES: 1. Gill decompression, right L5-S1 with complete excision of the facet     and pars. 2. Posterolateral arthrodesis, L5-S1 with autograft bone from     decompression as well as DBX mix. 3. Posterior segmental instrumentation, L5-S1, with pedicle screw     fixation. 4. Complete diskectomy, L5-S1, with implantation of intervertebral     biomechanical device, the Synthes PEEK interbody cage, size 12.  COMPLICATIONS:  None.  CONDITION:  Stable.  Neuromonitoring throughout the case without adverse event.  HISTORY:  This is a very pleasant woman who has been having severe debilitating back, buttock, and right leg pain for several years now. Despite protracted conservative management, her quality of life continued to suffer.  As a result, she elected to proceed with surgery. All appropriate risks, benefits, and alternatives were discussed with the patient and consent was obtained.  OPERATIVE NOTE:  The patient was brought to the operating room, placed supine on the operating table.  After successful induction of general anesthesia and endotracheal intubation, TED, SCDs, and a Foley were inserted.  All appropriate needles for intraoperative neuromonitoring were inserted by the stim representative, and the patient was turned prone onto the Wilson frame.  All bony prominences were well padded and the back was prepped and draped in a  standard fashion.  Time-out was then done to confirm patient, procedure, and all other pertinent important data.  Once this was done, we then proceeded with the surgery.  Since her radicular leg pain was left-sided, I elected to do the decompression and interbody fixation from the right side.  I started on the left.  I identified the lateral border of the L5 and S1 pedicles and then made a lateral Wiltse incision.  I then advanced the Jamshidi needle through percutaneously down to the lateral aspect of the facet complex.  I then advanced the Jamshidi needle through the pedicle. I did also attach the Jamshidi needle to the neuromonitoring and using both fluoro as well as real time stimulation confirmed that there was no breach of the pedicle.  Once the Jamshidi needle was into the vertebral body, I then placed a guide pin through the pedicle.  With this pedicle cannulated, I repeated the entire procedure at S1.  With both the left L5 and S1 pedicles cannulated, I then tapped and then placed appropriate- sized pedicle screws.  I then stimulated both those pedicle screws and neither one showed any electrodiagnostic evidence of breach. Radiographically, the screws were also properly positioned in both the AP and lateral planes.  I believe that went to the right.  I then made a simple Wiltse incision  on the lateral side of the L5 and S1 pedicles, and I sharply dissected down to the deep fascia.  The deep fascia was sharply incised and then I mobilized the paraspinal muscles.  I identified the lateral border of the L5 and S1.  It was grossly mobile due to the pars defect.  Thompson retractor in place, and then opened the retractor to the appropriate width.  Once the retractor was at the appropriate width, I could clearly visualize the posterolateral aspect of the spine.  I then used an osteotome to resect the inferior facet of L5.  I then used a 2 and 3 mm Kerrison to perform a very  generous laminotomy of L5.  I took my laminotomy superiorly to the insertion site of the ligamentum flavum.  I released the ligamentum flavum and then removed it in bulk to expose the lateral aspect of the thecal sac.  I then continued my dissection inferiorly.  There was significant fibrocartilaginous material, causing significant compression of the L5 nerve root.  I identified the L5 nerve root and then resected all of the overhanging bone spur and fibrocartilaginous material.  The L5 nerve root was significantly compressed.  There was petechial changes, indicative of chronic compression.  I then carried my dissection inferiorly, removing a portion of the superior S1 facet complex until I could see the superior border of the S1 pedicle.  I then identified the S1 nerve root and performed a generous foraminotomy.  At this point, I had to complete posterolateral Gill decompression.  I then retracted and protected the thecal sac and exposed the disk space.  Because of the extensive decompression and gross instability from a pars defect, I was concerned about instability and I elected to proceed with interbody fixation as it would improve my overall fusion rates.  As such, I incised the annulus with a 15-blade scalpel, then used a combination of pituitary rongeurs, various curettes, Kerrison rongeurs, and rasp to remove the majority of the disk material.  I was able to scrape the endplates with my curette, confirming I had bleeding subchondral bone,and I had removed all of the cartilaginous endplate.  Once this was done, I elected to use a Synthes intervertebral cages, I could put a large cage in horizontally and then turn it, so that I would get better distraction.  I protected the L5 and S1 nerve roots and I placed bone graft along the anterior annulus.  With the bone graft in place, I then placed the implant and turned it, so that it was vertical.  I had excellent purchase, it was in the  midline.  I then advanced it forward, so that it was countersunk below the posterior vertebral body.  With countersunk, I irrigated copiously with normal saline.  I was very pleased with position.  At this point, using the Jamshidi needle in the same technique I used on the left side, I placed Jamshidi needles into the L5 and S1 pedicles and advanced the guide pin through it.  I then tapped and placed the same sized screws at L5 and S1.  I stimulated both screws and again there was no electrodiagnostic evidence of breach.  I was also able to visualize the medial and superior border of the S1 pedicle and the inferior medial border of the L5 pedicle to confirm that there was no breach.  With this point, I then measured and placed the appropriate size rod and torqued and locked it to the pedicle screws and torqued down  the screws.  I then placed the appropriate size screws, brought on the left side, and torqued down the notch.  I then used an osteotome to decorticate the transverse portion of sacral ala and the transverse process of L5.  I then put posterolateral bone graft on the left side.  All wounds were copiously irrigated with normal saline, and then closed in a similar fashion with 2-0 Vicryl for the deep fascia and 3-0 Monocryl for the skin.  Steri-Strips and dry dressing were applied. The patient was extubated, transferred to the PACU without incident.  At the end of the case, all needle and sponge counts were correct.  There was no adverse intraoperative events.     Alvy Beal, MD     DDB/MEDQ  D:  09/07/2012  T:  09/08/2012  Job:  [id number]

## 2012-09-09 MED ORDER — IPRATROPIUM-ALBUTEROL 18-103 MCG/ACT IN AERO
2.0000 | INHALATION_SPRAY | RESPIRATORY_TRACT | Status: DC | PRN
  Filled 2012-09-09: qty 14.7

## 2012-09-09 MED ORDER — GABAPENTIN 300 MG PO CAPS
300.0000 mg | ORAL_CAPSULE | Freq: Three times a day (TID) | ORAL | Status: DC
  Administered 2012-09-09 – 2012-09-10 (×3): 300 mg via ORAL
  Filled 2012-09-09 (×5): qty 1

## 2012-09-09 MED ORDER — OXYCODONE HCL ER 15 MG PO T12A
15.0000 mg | EXTENDED_RELEASE_TABLET | Freq: Two times a day (BID) | ORAL | Status: DC
  Administered 2012-09-09 – 2012-09-10 (×3): 15 mg via ORAL
  Filled 2012-09-09 (×3): qty 1

## 2012-09-09 MED ORDER — OXYCODONE HCL 5 MG PO TABS
15.0000 mg | ORAL_TABLET | ORAL | Status: DC | PRN
  Administered 2012-09-09 – 2012-09-10 (×3): 15 mg via ORAL
  Filled 2012-09-09 (×3): qty 3

## 2012-09-09 NOTE — Clinical Social Work Note (Signed)
Clinical Social Work  CSW received consult for SNF. CSW reviewed chart and discussed pt with RNCM. PT is not recommending follow up. CSW is signing off at this time, as no further needs are indentified. Please reconsult if a need arises prior to discharge.   Dede Query, MSW, [facility] [phone]

## 2012-09-09 NOTE — Progress Notes (Signed)
Physical Therapy Treatment Patient Details Name: Vanessa Payne MRN: [id number] DOB: 1971/06/12 Today's Date: 09/09/2012 Time: 3086-5784 PT Time Calculation (min): 19 min  PT Assessment / Plan / Recommendation Comments on Treatment Session  Pt. presents to be moving well when OOB and using RW. Pt. ambulated into hallway, to the stairs in gym on floor [address] and back to her room. Pt. given cues for safe hand placement with transfers and for safe use of RW (making sure she doesn't clip corners or objects).     Follow Up Recommendations  No PT follow up     Does the patient have the potential to tolerate intense rehabilitation     Barriers to Discharge        Equipment Recommendations  Rolling walker with 5" wheels    Recommendations for Other Services    Frequency Min 3X/week   Plan      Precautions / Restrictions Precautions Precautions: Back Required Braces or Orthoses: Spinal Brace Spinal Brace: Applied in supine position Restrictions Weight Bearing Restrictions: No   Pertinent Vitals/Pain Patient reports pain 10/10; she states that she has had pain medications - moving well OOB with RW.    Mobility  Bed Mobility Bed Mobility: Not assessed Details for Bed Mobility Assistance: Pt. stated that there is some pain getting in/OOB; Pt. states proper technique - log rolling. Transfers Transfers: Sit to Stand;Stand to Sit Sit to Stand: 4: Min guard;With upper extremity assist;With armrests;From chair/3-in-1 Stand to Sit: 4: Min guard;With upper extremity assist;To chair/3-in-1 Details for Transfer Assistance: Min guard for safety and steadiness from reported pain. Pt. given VC throughout for the safest hand placements with RW during transfers.  Ambulation/Gait Ambulation/Gait Assistance: 4: Min guard Ambulation Distance (Feet): 175 Feet Assistive device: Rolling walker Ambulation/Gait Assistance Details: Pt. steady with RW. Pt. with slight trunk flexion with ambulation with  RW. Raised RW and pt. able to ambulate with erect posture. Pt. given cues for making sure she stays close and in the center of the RW. Gait Pattern: Step-through pattern;Decreased stride length Stairs: Yes Stairs Assistance: 4: Min guard Stairs Assistance Details (indicate cue type and reason): Pt. min guard with stairs for safety and balance. Pt. able to ascend forwards and sideways. Pt. states she is not sure someone would help her at home, so was educated in sideways technique stating she felt steady with that method. Pt demonstrated sideways method and encouraged to still try and have someone with her, just for steadiness.  Stair Management Technique: One rail Right;One rail Left;Alternating pattern;Step to pattern;Sideways;Forwards Number of Stairs: 3  (3x2trials) Wheelchair Mobility Wheelchair Mobility: No      PT Goals Acute Rehab PT Goals PT Goal Formulation: With patient Time For Goal Achievement: 09/14/12 Potential to Achieve Goals: Good Pt will Ambulate: >150 feet;with modified independence;with rolling walker PT Goal: Ambulate - Progress: Progressing toward goal Pt will Go Up / Down Stairs: 1-2 stairs;with rolling walker PT Goal: Up/Down Stairs - Progress: Progressing toward goal  Visit Information  Last PT Received On: 09/09/12 Assistance Needed: +1    Subjective Data  Subjective: "I'm in pain, but I can walk"   Cognition  Overall Cognitive Status: Appears within functional limits for tasks assessed/performed Arousal/Alertness: Awake/alert Orientation Level: Appears intact for tasks assessed Behavior During Session: [facility] for tasks performed    Balance     End of Session PT - End of Session Equipment Utilized During Treatment: Gait belt;Back brace Activity Tolerance: Patient tolerated treatment well Patient left: in chair;with  call bell/phone within reach Nurse Communication: Mobility status    Mertie Clause, SPTA 09/09/2012, 11:15 AM

## 2012-09-09 NOTE — Progress Notes (Signed)
    Subjective: Procedure(s) (LRB): POSTERIOR LUMBAR FUSION 1 LEVEL (N/A) 2 Days Post-Op  Patient reports pain as 6 on 0-10 scale.  Reports unchanged leg pain reports incisional back pain   Positive void Negative bowel movement Negative flatus Negative chest pain or shortness of breath  Objective: Vital signs in last 24 hours: Temp:  [98.4 F (36.9 C)-98.5 F (36.9 C)] 98.4 F (36.9 C) (11/29 0703) Pulse Rate:  [72-84] 72  (11/29 0703) Resp:  [16-18] 16  (11/29 0703) BP: (115-131)/(58-67) 115/66 mmHg (11/29 0703) SpO2:  [95 %-99 %] 96 % (11/29 0916)  Intake/Output from previous day: 11/28 0701 - 11/29 0700 In: 1702.5 [P.O.:240; I.V.:1462.5] Out: -   Labs: No results found for this basename: WBC:2,RBC:2,HCT:2,PLT:2 in the last 72 hours No results found for this basename: NA:2,K:2,CL:2,CO2:2,BUN:2,CREATININE:2,GLUCOSE:2,CALCIUM:2 in the last 72 hours No results found for this basename: LABPT:2,INR:2 in the last 72 hours  Physical Exam: Neurologically intact ABD soft Intact pulses distally Incision: dressing C/D/I and no drainage Compartment soft  Assessment/Plan: Patient stable  xrays satisfactory hardware placement Continue mobilization with physical therapy Continue care  Advance diet Up with therapy Plan for discharge tomorrow if pain control improved Changed pain regimen - . Increased neurontin to 300mg  TID.  Added Oxycontin 15mg  BID and increased oxycodone 15mg  for breakthrough.  Venita Lick, MD [facility] [phone]

## 2012-09-09 NOTE — Progress Notes (Signed)
Utilization review completed. Sallye Lunz, RN, BSN. 

## 2012-09-09 NOTE — Progress Notes (Signed)
Nutrition Brief Note  Patient identified on the Malnutrition Screening Tool (MST) Report  Wt:  131#  Ht:  5'5" Pt pt weight varies 15# up and down. Pt reports that she has been so busy with work she does not take time to eat.  Usually only eats 1 meal per day.  Encouraged intake of more regular meal schedule and maintenance of a healthy weight.   Current diet order is Regular, patient is consuming approximately 50% of meals at this time. Labs and medications reviewed.   No nutrition interventions warranted at this time. If nutrition issues arise, please consult RD.   Oran Rein, RD, LDN Clinical Inpatient Dietitian Pager:  [phone] Weekend and after hours pager:  [phone]

## 2012-09-10 MED ORDER — OXYCODONE HCL 15 MG PO TABS: 15.0000 mg | ORAL_TABLET | ORAL | Status: DC | PRN

## 2012-09-10 MED ORDER — OXYCODONE HCL ER 15 MG PO T12A: 15.0000 mg | EXTENDED_RELEASE_TABLET | Freq: Two times a day (BID) | ORAL | Status: DC

## 2012-09-10 NOTE — Progress Notes (Signed)
D/C instructions reviewed with patient. RX x 2 given. RX x 2 called into pharmacy. Rolling walker delivered to bedside. No other hh equipment or services needed. All questions answered. Pt d/c'ed via wheelchair in stable condition

## 2012-09-10 NOTE — Discharge Summary (Signed)
Physician Discharge Summary  Patient ID: Vanessa Payne MRN: [id number] DOB/AGE: 04-21-1971 41 y.o.  Admit date: 09/07/2012 Discharge date: 09/10/2012  Admission Diagnoses:  Lumbar pain  Discharge Diagnoses:  Same   Surgeries: Procedure(s): POSTERIOR LUMBAR FUSION 1 LEVEL on 09/07/2012   Consultants: PT/OT  Discharged Condition: Stable  Hospital Course: Vanessa Payne is an 41 y.o. female who was admitted 09/07/2012 with a chief complaint of No chief complaint on file. 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  They were brought to the operating room on 09/07/2012 and underwent the above named procedures.    The patient had an uncomplicated hospital course and was stable for discharge.  Recent vital signs:  Filed Vitals:   09/10/12 0623  BP: 124/66  Pulse: 70  Temp: 98.5 F (36.9 C)  Resp: 16    Recent laboratory studies:  Results for orders placed during the hospital encounter of 09/01/12  BASIC METABOLIC PANEL      Component Value Range   Sodium 139  135 - 145 mEq/L   Potassium 3.8  3.5 - 5.1 mEq/L   Chloride 106  96 - 112 mEq/L   CO2 24  19 - 32 mEq/L   Glucose, Bld 85  70 - 99 mg/dL   BUN 7  6 - 23 mg/dL   Creatinine, Ser 7.82  0.50 - 1.10 mg/dL   Calcium 9.2  8.4 - 95.6 mg/dL   GFR calc non Af Amer >90  >90 mL/min   GFR calc Af Amer >90  >90 mL/min  CBC      Component Value Range   WBC 8.8  4.0 - 10.5 K/uL   RBC 3.87  3.87 - 5.11 MIL/uL   Hemoglobin 12.3  12.0 - 15.0 g/dL   HCT 21.3  08.6 - 57.8 %   MCV 95.9  78.0 - 100.0 fL   MCH 31.8  26.0 - 34.0 pg   MCHC 33.2  30.0 - 36.0 g/dL   RDW 46.9  62.9 - 52.8 %   Platelets 336  150 - 400 K/uL  TYPE AND SCREEN      Component Value Range   ABO/RH(D) B POS     Antibody Screen NEG     Sample Expiration 09/15/2012    HCG, SERUM, QUALITATIVE      Component Value Range   Preg, Serum NEGATIVE  NEGATIVE  SURGICAL PCR SCREEN      Component Value Range   MRSA, PCR  NEGATIVE  NEGATIVE   Staphylococcus aureus POSITIVE (*) NEGATIVE    Discharge Medications:     Medication List     As of 09/10/2012  2:58 PM    STOP taking these medications         diclofenac sodium 1 % Gel   Commonly known as: VOLTAREN      oxyCODONE-acetaminophen 5-325 MG per tablet   Commonly known as: PERCOCET/ROXICET      TAKE these medications         albuterol 108 (90 BASE) MCG/ACT inhaler   Commonly known as: PROVENTIL HFA;VENTOLIN HFA   Inhale 2 puffs into the lungs 4 (four) times daily.      ALPRAZolam 0.5 MG tablet   Commonly known as: XANAX   Take 1 tablet (0.5 mg total) by mouth 2 (two) times daily as needed. For anxiety      ANTI-STICK INSULIN SYR 1CC/29G 29G X 1/2" 1 ML Misc   As dirr  butalbital-acetaminophen-caffeine 50-325-40 MG per tablet   Commonly known as: FIORICET, ESGIC   Take 1 tablet by mouth every 6 (six) hours as needed for headache.      cyanocobalamin 1000 MCG/ML injection   Commonly known as: (VITAMIN B-12)   Inject 1 mL (1,000 mcg total) into the muscle every 21 ( twenty-one) days.      Vitamin B-12 2500 MCG Subl   Place 3 tablets under the tongue daily.      docusate sodium 100 MG capsule   Commonly known as: COLACE   Take 1 capsule (100 mg total) by mouth 2 (two) times daily.      gabapentin 300 MG capsule   Commonly known as: NEURONTIN   TAKE ONE CAPSULE BY MOUTH FIVE TIMES DAILY AND TWO CAPSULES NIGHTLY ATBEDTIME.      methocarbamol 500 MG tablet   Commonly known as: ROBAXIN   Take 1 tablet (500 mg total) by mouth 3 (three) times daily as needed.      omeprazole 40 MG capsule   Commonly known as: PRILOSEC   TAKE ONE CAPSULE BY MOUTH ONE TIME DAILY      ondansetron 4 MG tablet   Commonly known as: ZOFRAN   Take 1 tablet (4 mg total) by mouth every 8 (eight) hours as needed for nausea.      oxyCODONE 15 MG immediate release tablet   Commonly known as: ROXICODONE   Take 1 tablet (15 mg total) by mouth every 4  (four) hours as needed.      OxyCODONE 15 mg T12a   Commonly known as: OXYCONTIN   Take 1 tablet (15 mg total) by mouth every 12 (twelve) hours.      oxyCODONE-acetaminophen 10-325 MG per tablet   Commonly known as: PERCOCET   Take 1 tablet by mouth every 6 (six) hours as needed for pain.      topiramate 100 MG tablet   Commonly known as: TOPAMAX   Take 1 tablet (100 mg total) by mouth daily.      venlafaxine 75 MG tablet   Commonly known as: EFFEXOR   Take 3 tablets (225 mg total) by mouth every morning.      Vitamin D3 5000 UNITS Caps   Take 1 capsule by mouth daily.      zolpidem 10 MG tablet   Commonly known as: AMBIEN   Take 0.5-1 tablets (5-10 mg total) by mouth at bedtime as needed.        Diagnostic Studies: Dg Lumbar Spine 2-3 Views  09/08/2012  *RADIOLOGY REPORT*  Clinical Data: Post lumbar fusion  LUMBAR SPINE - 2-3 VIEW  Comparison: 09/01/2012; intraoperative lumbar spine localization - 09/07/2012  Findings:  There are five non-rib bearing lumbar type vertebral bodies.  The patient has undergone L5 - S1 paraspinal rod fixation and intervertebral disc replacement, now near anatomic alignment and restoration of the L5 - S1 intervertebral disc space height. Possible bone graft material overlies the left L5 - S1 transverse processes.  Lumbar vertebral body heights are preserved.  Remaining intervertebral disc spaces are preserved. Limited visualization of the bilateral SI joints is normal.  Surgical clips overlie the right lower abdominal quadrant. Regional bowel gas pattern is normal.  IMPRESSION: Post L5 - S1 paraspinal rod fixation and disc replacement without complicating feature.   Original Report Authenticated By: Tacey Ruiz, MD    Dg Lumbar Spine 2-3 Views  09/07/2012  *RADIOLOGY REPORT*  Clinical Data: L5-S1 P L I S  DG C-ARM GT 120 MIN,LUMBAR SPINE - 2-3 VIEW  Technique: Two intraoperative views of the lumbar spine are provided  Comparison:  Plain film lumbar  spine 09/01/2012  Findings: Using the convention of the comparison radiograph, there are pedicle screws in the L5 and S1 vertebral bodies with posterior fusion rods.  There is interbody spacer L5-S1. Graft material seen on the frontal projection.  IMPRESSION: Posterior fusion at L5-S1 without complication.   Original Report Authenticated By: Genevive Bi, M.D.    Dg Lumbar Spine 2-3 Views  09/01/2012  *RADIOLOGY REPORT*  Clinical Data: Preop for lumbar spine surgery  LUMBAR SPINE - 2-3 VIEW  Comparison: MRI lumbar spine of 07/29/2011  Findings: Pars defects again are noted at L5 with very minimal anterolisthesis of L5 on S1.  Intervertebral disc spaces appear normal. No acute abnormality is seen.  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   Original Report Authenticated By: Dwyane Dee, M.D.    Dg C-arm Gt 120 Min  09/07/2012  *RADIOLOGY REPORT*  Clinical Data: L5-S1 P L I S  DG C-ARM GT 120 MIN,LUMBAR SPINE - 2-3 VIEW  Technique: Two intraoperative views of the lumbar spine are provided  Comparison:  Plain film lumbar spine 09/01/2012  Findings: Using the convention of the comparison radiograph, there are pedicle screws in the L5 and S1 vertebral bodies with posterior fusion rods.  There is interbody spacer L5-S1. Graft material seen on the frontal projection.  IMPRESSION: Posterior fusion at L5-S1 without complication.   Original Report Authenticated By: Genevive Bi, M.D.     Disposition: 01-Home or Self Care      Discharge Orders    Future Appointments: Provider: Department: Dept Phone: Center:   01/31/2013 3:45 PM Tresa Garter, MD [facility] [phone] [facility]     Future Orders Please Complete By Expires   Diet - low sodium heart healthy      Call MD / Call 911      Comments:   If you experience chest pain or shortness of breath, CALL 911 and be transported to the hospital emergency room.  If you develope a  fever above 101 F, pus (white drainage) or increased drainage or redness at the wound, or calf pain, call your surgeon's office.   Constipation Prevention      Comments:   Drink plenty of fluids.  Prune juice may be helpful.  You may use a stool softener, such as Colace (over the counter) 100 mg twice a day.  Use MiraLax (over the counter) for constipation as needed.   Increase activity slowly as tolerated         Follow-up Information    Follow up with Alvy Beal, MD. Call in 2 weeks. (As needed if symptoms worsen)    Contact information:   [address] [place] [address] [phone]           Signed: Thea Gist 09/10/2012, 2:58 PM

## 2012-09-10 NOTE — Progress Notes (Signed)
Orthopedics Progress Note  Subjective: Pt feeling much better today after medication change yesterday Ready for d/c  Objective:  Filed Vitals:   09/10/12 0623  BP: 124/66  Pulse: 70  Temp: 98.5 F (36.9 C)  Resp: 16    General: Awake and alert  Musculoskeletal: lumbar incision healing well, no drainage or erythema Neurovascularly intact  Lab Results  Component Value Date   WBC 8.8 09/01/2012   HGB 12.3 09/01/2012   HCT 37.1 09/01/2012   MCV 95.9 09/01/2012   PLT 336 09/01/2012       Component Value Date/Time   NA 139 09/01/2012 1604   K 3.8 09/01/2012 1604   CL 106 09/01/2012 1604   CO2 24 09/01/2012 1604   GLUCOSE 85 09/01/2012 1604   BUN 7 09/01/2012 1604   CREATININE 0.74 09/01/2012 1604   CALCIUM 9.2 09/01/2012 1604   GFRNONAA >90 09/01/2012 1604   GFRAA >90 09/01/2012 1604    No results found for this basename: INR, PROTIME    Assessment/Plan: POD #3 s/p Procedure(s): POSTERIOR LUMBAR FUSION 1 LEVEL  D/c home today  F/u in 2 weeks  Almedia Balls. Ranell Patrick, MD 09/10/2012 8:33 AM

## 2012-09-12 NOTE — Progress Notes (Signed)
Wylee Dorantes, PTA [phone] 09/12/2012  

## 2012-09-13 MED FILL — Heparin Sodium (Porcine) Inj 1000 Unit/ML: INTRAMUSCULAR | Qty: 30 | Status: AC

## 2012-09-13 MED FILL — Sodium Chloride IV Soln 0.9%: INTRAVENOUS | Qty: 1000 | Status: AC

## 2012-10-21 ENCOUNTER — Telehealth: Payer: Self-pay | Admitting: *Deleted

## 2012-10-21 MED ORDER — ZOLPIDEM TARTRATE 10 MG PO TABS: 5.0000 mg | ORAL_TABLET | Freq: Every evening | ORAL | Status: DC | PRN

## 2012-10-21 NOTE — Telephone Encounter (Signed)
Rf req for Zolpidem 10 mg Last filled 09/14/12. Ok to Rf?

## 2012-10-21 NOTE — Telephone Encounter (Signed)
Done

## 2012-10-21 NOTE — Telephone Encounter (Signed)
OK to fill this prescription with additional refills x3 Thank you!  

## 2013-01-31 ENCOUNTER — Ambulatory Visit: Payer: 59 | Admitting: Internal Medicine

## 2013-02-15 ENCOUNTER — Other Ambulatory Visit: Payer: Self-pay | Admitting: Internal Medicine

## 2013-02-20 ENCOUNTER — Other Ambulatory Visit: Payer: Self-pay | Admitting: Internal Medicine

## 2013-03-07 ENCOUNTER — Telehealth: Payer: Self-pay | Admitting: *Deleted

## 2013-03-07 MED ORDER — ALPRAZOLAM 0.5 MG PO TABS: 0.5000 mg | ORAL_TABLET | Freq: Two times a day (BID) | ORAL | Status: DC | PRN

## 2013-03-07 NOTE — Telephone Encounter (Signed)
OK to fill this prescription with additional refills x0 Needs OV Thank you!  

## 2013-03-07 NOTE — Telephone Encounter (Signed)
Also, Rf req for Alprazolam 0.5 mg. Last dispensed 01/23/13. Ok to RF?

## 2013-03-07 NOTE — Telephone Encounter (Signed)
Alprazolam is done. Please advise on Prozac. Thanks

## 2013-03-07 NOTE — Telephone Encounter (Signed)
rec fax requesting to change Effexor 75 mg to Prozac 20 mg due to cost savings. Please if ok and sig. thanks  

## 2013-03-15 NOTE — Telephone Encounter (Signed)
Do not change pls, unless pt has requested Thx

## 2013-03-15 NOTE — Telephone Encounter (Signed)
rec fax requesting to change Effexor 75 mg to Prozac 20 mg due to cost savings. Please if ok and sig. thanks  

## 2013-03-16 NOTE — Telephone Encounter (Signed)
Left mess for patient to call back.  

## 2013-03-20 ENCOUNTER — Telehealth: Payer: Self-pay | Admitting: *Deleted

## 2013-03-20 NOTE — Telephone Encounter (Signed)
Rf req for Zolpidem 10 mg. Last filled 01/23/13. Ok to Rf?

## 2013-03-20 NOTE — Telephone Encounter (Signed)
rec fax requesting to change Effexor 75 mg to Prozac 20 mg due to cost savings. Please if ok and sig. thanks

## 2013-03-20 NOTE — Telephone Encounter (Signed)
OK to fill this prescription with additional refills x2 Thank you!  

## 2013-03-21 MED ORDER — ZOLPIDEM TARTRATE 10 MG PO TABS: 5.0000 mg | ORAL_TABLET | Freq: Every evening | ORAL | Status: DC | PRN

## 2013-03-21 NOTE — Telephone Encounter (Signed)
Done

## 2013-05-22 ENCOUNTER — Telehealth: Payer: Self-pay | Admitting: *Deleted

## 2013-05-22 NOTE — Telephone Encounter (Signed)
Refill request for Alprazolam 0.5mg  Last OV 10.22.13 Last filled 5.27.14

## 2013-05-22 NOTE — Telephone Encounter (Signed)
OK to fill this prescription with additional refills x0 Needs ov Thank you!

## 2013-05-23 MED ORDER — ALPRAZOLAM 0.5 MG PO TABS: 0.5000 mg | ORAL_TABLET | Freq: Two times a day (BID) | ORAL | Status: DC | PRN

## 2013-05-23 NOTE — Telephone Encounter (Signed)
Refill done.  

## 2013-06-13 ENCOUNTER — Other Ambulatory Visit: Payer: Self-pay | Admitting: Internal Medicine

## 2013-06-15 ENCOUNTER — Telehealth: Payer: Self-pay | Admitting: *Deleted

## 2013-06-15 NOTE — Telephone Encounter (Signed)
Message copied by Merrilyn Puma on Thu Jun 15, 2013  2:16 PM ------      Message from: Livingston Diones      Created: Wed Jun 14, 2013 10:50 AM       Left vm for pt to call back and schedule an appt with Plot      ----- Message -----         From: Merrilyn Puma, CMA         Sent: 06/14/2013   9:58 AM           To: Etheleen Sia, Phetcharat Noitamyae            Pt needs OV per MD. I refilled her migraine med x 1.            Thanks!!!       ------

## 2013-07-05 ENCOUNTER — Encounter: Payer: Self-pay | Admitting: Internal Medicine

## 2013-07-05 ENCOUNTER — Other Ambulatory Visit (INDEPENDENT_AMBULATORY_CARE_PROVIDER_SITE_OTHER): Payer: BC Managed Care – PPO

## 2013-07-05 ENCOUNTER — Ambulatory Visit (INDEPENDENT_AMBULATORY_CARE_PROVIDER_SITE_OTHER): Payer: BC Managed Care – PPO | Admitting: Internal Medicine

## 2013-07-05 VITALS — BP 120/70 | HR 77 | Temp 98.1°F | Wt 124.0 lb

## 2013-07-05 DIAGNOSIS — M545 Low back pain, unspecified: Secondary | ICD-10-CM

## 2013-07-05 DIAGNOSIS — F17200 Nicotine dependence, unspecified, uncomplicated: Secondary | ICD-10-CM

## 2013-07-05 DIAGNOSIS — E538 Deficiency of other specified B group vitamins: Secondary | ICD-10-CM

## 2013-07-05 DIAGNOSIS — R5383 Other fatigue: Secondary | ICD-10-CM

## 2013-07-05 DIAGNOSIS — F3289 Other specified depressive episodes: Secondary | ICD-10-CM

## 2013-07-05 DIAGNOSIS — F329 Major depressive disorder, single episode, unspecified: Secondary | ICD-10-CM

## 2013-07-05 DIAGNOSIS — R5381 Other malaise: Secondary | ICD-10-CM

## 2013-07-05 DIAGNOSIS — Z23 Encounter for immunization: Secondary | ICD-10-CM

## 2013-07-05 DIAGNOSIS — F411 Generalized anxiety disorder: Secondary | ICD-10-CM

## 2013-07-05 DIAGNOSIS — G43119 Migraine with aura, intractable, without status migrainosus: Secondary | ICD-10-CM

## 2013-07-05 DIAGNOSIS — G43819 Other migraine, intractable, without status migrainosus: Secondary | ICD-10-CM

## 2013-07-05 LAB — URINALYSIS
Bilirubin Urine: NEGATIVE
Hgb urine dipstick: NEGATIVE
Ketones, ur: NEGATIVE
Leukocytes, UA: NEGATIVE
Nitrite: NEGATIVE
Specific Gravity, Urine: <=1.005 (ref 1.000–1.030)
Total Protein, Urine: NEGATIVE
Urine Glucose: NEGATIVE
Urobilinogen, UA: 0.2 (ref 0.0–1.0)
pH: 6 (ref 5.0–8.0)

## 2013-07-05 LAB — BASIC METABOLIC PANEL
BUN: 2 mg/dL — ABNORMAL LOW (ref 6–23)
CO2: 26 mEq/L (ref 19–32)
Calcium: 8.9 mg/dL (ref 8.4–10.5)
Chloride: 108 mEq/L (ref 96–112)
Creatinine, Ser: 0.7 mg/dL (ref 0.4–1.2)
GFR: 104.12 mL/min (ref >60.00)
Glucose, Bld: 89 mg/dL (ref 70–99)
Potassium: 3 mEq/L — ABNORMAL LOW (ref 3.5–5.1)
Sodium: 140 mEq/L (ref 135–145)

## 2013-07-05 LAB — CBC WITH DIFFERENTIAL/PLATELET
Basophils Absolute: 0 10*3/uL (ref 0.0–0.1)
Basophils Relative: 0.3 % (ref 0.0–3.0)
Eosinophils Absolute: 0 10*3/uL (ref 0.0–0.7)
Eosinophils Relative: 0.1 % (ref 0.0–5.0)
HCT: 35.2 % — ABNORMAL LOW (ref 36.0–46.0)
Hemoglobin: 11.8 g/dL — ABNORMAL LOW (ref 12.0–15.0)
Lymphocytes Relative: 35.7 % (ref 12.0–46.0)
Lymphs Abs: 3.5 10*3/uL (ref 0.7–4.0)
MCHC: 33.4 g/dL (ref 30.0–36.0)
MCV: 95 fl (ref 78.0–100.0)
Monocytes Absolute: 0.5 10*3/uL (ref 0.1–1.0)
Monocytes Relative: 5.5 % (ref 3.0–12.0)
Neutro Abs: 5.7 10*3/uL (ref 1.4–7.7)
Neutrophils Relative %: 58.4 % (ref 43.0–77.0)
Platelets: 415 10*3/uL — ABNORMAL HIGH (ref 150.0–400.0)
RBC: 3.71 Mil/uL — ABNORMAL LOW (ref 3.87–5.11)
RDW: 14 % (ref 11.5–14.6)
WBC: 9.7 10*3/uL (ref 4.5–10.5)

## 2013-07-05 LAB — TSH: TSH: 0.63 u[IU]/mL (ref 0.35–5.50)

## 2013-07-05 LAB — HEPATIC FUNCTION PANEL
ALT: 53 U/L — ABNORMAL HIGH (ref 0–35)
AST: 82 U/L — ABNORMAL HIGH (ref 0–37)
Albumin: 3.4 g/dL — ABNORMAL LOW (ref 3.5–5.2)
Alkaline Phosphatase: 90 U/L (ref 39–117)
Bilirubin, Direct: 0 mg/dL (ref 0.0–0.3)
Total Bilirubin: 0.4 mg/dL (ref 0.3–1.2)
Total Protein: 7.1 g/dL (ref 6.0–8.3)

## 2013-07-05 LAB — VITAMIN B12: Vitamin B-12: >1500 pg/mL — ABNORMAL HIGH (ref 211–911)

## 2013-07-05 MED ORDER — BUTALBITAL-APAP-CAFFEINE 50-325-40 MG PO TABS: ORAL_TABLET | ORAL | Status: DC

## 2013-07-05 MED ORDER — VENLAFAXINE HCL 75 MG PO TABS: 225.0000 mg | ORAL_TABLET | Freq: Every morning | ORAL | Status: DC

## 2013-07-05 MED ORDER — ALPRAZOLAM 0.5 MG PO TABS: 0.5000 mg | ORAL_TABLET | Freq: Two times a day (BID) | ORAL | Status: DC | PRN

## 2013-07-05 MED ORDER — METHOCARBAMOL 500 MG PO TABS: 500.0000 mg | ORAL_TABLET | Freq: Three times a day (TID) | ORAL | Status: DC | PRN

## 2013-07-05 MED ORDER — GABAPENTIN 300 MG PO CAPS: ORAL_CAPSULE | ORAL | Status: DC

## 2013-07-05 MED ORDER — TOPIRAMATE 100 MG PO TABS: ORAL_TABLET | ORAL | Status: DC

## 2013-07-05 MED ORDER — OMEPRAZOLE 40 MG PO CPDR: DELAYED_RELEASE_CAPSULE | ORAL | Status: DC

## 2013-07-05 MED ORDER — ZOLPIDEM TARTRATE 10 MG PO TABS: 5.0000 mg | ORAL_TABLET | Freq: Every evening | ORAL | Status: DC | PRN

## 2013-07-05 MED ORDER — CYANOCOBALAMIN 1000 MCG/ML IJ SOLN: 1000.0000 ug | INTRAMUSCULAR | Status: DC

## 2013-07-05 NOTE — Assessment & Plan Note (Signed)
Continue with current prescription therapy as reflected on the Med list. Re-started Effexor

## 2013-07-05 NOTE — Assessment & Plan Note (Signed)
2014 better

## 2013-07-05 NOTE — Assessment & Plan Note (Signed)
Dr Shon Baton [facility] - pain clinic 11/13 surgery: 1. Gill decompression, right L5-S1 with complete excision of the facet  and pars.  2. Posterolateral arthrodesis, L5-S1 with autograft bone from  decompression as well as DBX mix.  3. Posterior segmental instrumentation, L5-S1, with pedicle screw  fixation.  4. Complete diskectomy, L5-S1, with implantation of intervertebral  biomechanical device, the Synthes PEEK interbody cage, size 12.

## 2013-07-05 NOTE — Assessment & Plan Note (Signed)
Continue with current prescription therapy as reflected on the Med list.  

## 2013-07-05 NOTE — Assessment & Plan Note (Signed)
Discussed.

## 2013-07-05 NOTE — Progress Notes (Signed)
Subjective:    HPI   . The patient is here to follow up on chronic depression, anxiety, headaches and chronic moderate fibromyalgia symptoms controlled with medicines, diet and exercise.  Working 40 h per week in [place] now - changed jobs  C/o LBP. Low back surgery 11/13 (Dr Shon Baton - [place] [facility]):  1. Gill decompression, right L5-S1 with complete excision of the facet  and pars.  2. Posterolateral arthrodesis, L5-S1 with autograft bone from  decompression as well as DBX mix.  3. Posterior segmental instrumentation, L5-S1, with pedicle screw  fixation.  4. Complete diskectomy, L5-S1, with implantation of intervertebral  biomechanical device, the Synthes PEEK interbody cage, size 12.    BP Readings from Last 3 Encounters:  07/05/13 120/70  09/10/12 124/66  09/10/12 124/66   Wt Readings from Last 3 Encounters:  07/05/13 124 lb (56.246 kg)  09/01/12 131 lb 1.6 oz (59.467 kg)  08/18/12 125 lb 12.8 oz (57.063 kg)     Review of Systems  Constitutional: Negative for chills, activity change, appetite change, fatigue and unexpected weight change.  HENT: Negative for congestion, mouth sores and sinus pressure.   Eyes: Negative for visual disturbance.  Respiratory: Negative for cough and chest tightness.   Gastrointestinal: Negative for nausea and abdominal pain.  Genitourinary: Negative for frequency, difficulty urinating and vaginal pain.  Musculoskeletal: Positive for myalgias, arthralgias and gait problem. Negative for back pain.  Skin: Negative for pallor and rash.  Neurological: Negative for dizziness, tremors, weakness, numbness and headaches.  Psychiatric/Behavioral: Positive for sleep disturbance and dysphoric mood. Negative for suicidal ideas, hallucinations, confusion, self-injury and agitation. The patient is nervous/anxious. The patient is not hyperactive.        Depressed, stressed       Objective:   Physical Exam  Constitutional: She appears well-developed and  well-nourished. No distress.  HENT:  Head: Normocephalic.  Right Ear: External ear normal.  Left Ear: External ear normal.  Nose: Nose normal.  Mouth/Throat: Oropharynx is clear and moist.  Eyes: Conjunctivae are normal. Pupils are equal, round, and reactive to light. Right eye exhibits no discharge. Left eye exhibits no discharge.  Neck: Normal range of motion. Neck supple. No JVD present. No tracheal deviation present. No thyromegaly present.  Cardiovascular: Normal rate, regular rhythm and normal heart sounds.   Pulmonary/Chest: No stridor. No respiratory distress. She has no wheezes.  Abdominal: Soft. Bowel sounds are normal. She exhibits no distension and no mass. There is no tenderness. There is no rebound and no guarding.  Musculoskeletal: She exhibits no edema and no tenderness.  Lymphadenopathy:    She has no cervical adenopathy.  Neurological: She displays normal reflexes. No cranial nerve deficit. She exhibits normal muscle tone. Coordination normal.  Skin: No rash noted. No erythema.  Psychiatric: Her behavior is normal. Judgment and thought content normal.   Lab Results  Component Value Date   WBC 8.8 09/01/2012   HGB 12.3 09/01/2012   HCT 37.1 09/01/2012   PLT 336 09/01/2012   GLUCOSE 85 09/01/2012   CHOL 203* 01/27/2012   TRIG 183.0* 01/27/2012   HDL 37.20* 01/27/2012   LDLDIRECT 137.9 01/27/2012   ALT 16 08/02/2012   AST 19 08/02/2012   NA 139 09/01/2012   K 3.8 09/01/2012   CL 106 09/01/2012   CREATININE 0.74 09/01/2012   BUN 7 09/01/2012   CO2 24 09/01/2012   TSH 2.30 08/02/2012      a complex case    Assessment & Plan:

## 2013-07-05 NOTE — Assessment & Plan Note (Signed)
   Potential benefits of a long term Fioricet  use as well as potential risks  and complications were explained to the patient and were aknowledged. Reboung HAs discussed.  Use less meds - taper down

## 2013-07-05 NOTE — Assessment & Plan Note (Signed)
Re-start Rx 

## 2013-07-06 ENCOUNTER — Other Ambulatory Visit: Payer: Self-pay | Admitting: Internal Medicine

## 2013-07-06 MED ORDER — POTASSIUM CHLORIDE CRYS ER 20 MEQ PO TBCR
20.0000 meq | EXTENDED_RELEASE_TABLET | Freq: Every day | ORAL | Status: DC
Start: 2013-07-06 — End: 2017-11-01

## 2013-07-11 ENCOUNTER — Other Ambulatory Visit: Payer: Self-pay | Admitting: *Deleted

## 2013-07-11 DIAGNOSIS — D649 Anemia, unspecified: Secondary | ICD-10-CM

## 2013-07-11 DIAGNOSIS — E876 Hypokalemia: Secondary | ICD-10-CM

## 2013-07-16 ENCOUNTER — Other Ambulatory Visit: Payer: Self-pay | Admitting: Internal Medicine

## 2013-08-19 ENCOUNTER — Other Ambulatory Visit: Payer: Self-pay | Admitting: Internal Medicine

## 2013-08-21 NOTE — Telephone Encounter (Signed)
Ok to refill? Last OV 9.24.14 Last filled 9.24.14

## 2013-08-29 ENCOUNTER — Encounter: Payer: Self-pay | Admitting: Internal Medicine

## 2013-08-29 ENCOUNTER — Ambulatory Visit (INDEPENDENT_AMBULATORY_CARE_PROVIDER_SITE_OTHER): Payer: BC Managed Care – PPO | Admitting: Internal Medicine

## 2013-08-29 VITALS — BP 130/82 | HR 84 | Temp 98.7°F | Resp 16 | Wt 125.0 lb

## 2013-08-29 DIAGNOSIS — R7989 Other specified abnormal findings of blood chemistry: Secondary | ICD-10-CM

## 2013-08-29 DIAGNOSIS — E876 Hypokalemia: Secondary | ICD-10-CM

## 2013-08-29 DIAGNOSIS — M545 Low back pain, unspecified: Secondary | ICD-10-CM

## 2013-08-29 DIAGNOSIS — R197 Diarrhea, unspecified: Secondary | ICD-10-CM

## 2013-08-29 MED ORDER — ZOLPIDEM TARTRATE 10 MG PO TABS: 5.0000 mg | ORAL_TABLET | Freq: Every evening | ORAL | Status: DC | PRN

## 2013-08-29 MED ORDER — BUTALBITAL-APAP-CAFFEINE 50-325-40 MG PO TABS: ORAL_TABLET | ORAL | Status: DC

## 2013-08-29 NOTE — Assessment & Plan Note (Signed)
Gluten free trial (no wheat products) for 4-6 weeks. OK to use gluten-free bread and gluten-free pasta.  Milk free trial (no milk, ice cream, cheese and yogurt) for 4-6 weeks. OK to use almond, coconut, rice or soy milk. "Almond breeze" brand tastes good.  

## 2013-08-29 NOTE — Assessment & Plan Note (Signed)
On KCl Labs 

## 2013-08-29 NOTE — Patient Instructions (Signed)
Gluten free trial (no wheat products) for 4-6 weeks. OK to use gluten-free bread and gluten-free pasta.  Milk free trial (no milk, ice cream, cheese and yogurt) for 4-6 weeks. OK to use almond, coconut, rice or soy milk. "Almond breeze" brand tastes good.  

## 2013-08-29 NOTE — Progress Notes (Signed)
Pre visit review using our clinic review tool, if applicable. No additional management support is needed unless otherwise documented below in the visit note. 

## 2013-08-29 NOTE — Assessment & Plan Note (Signed)
Taper down FIoricet Labs Liver US

## 2013-09-02 ENCOUNTER — Other Ambulatory Visit: Payer: Self-pay | Admitting: Internal Medicine

## 2013-09-02 ENCOUNTER — Encounter: Payer: Self-pay | Admitting: Internal Medicine

## 2013-09-02 NOTE — Assessment & Plan Note (Signed)
Continue with current prescription therapy as reflected on the Med list.  

## 2013-09-02 NOTE — Progress Notes (Signed)
   Subjective:    HPI   . The patient is here to follow up on chronic depression, anxiety, headaches and chronic moderate fibromyalgia symptoms controlled with medicines, diet and exercise.  Working 40 h per week in [place] now   C/o LBP. Low back surgery 11/13 (Dr Shon Baton - [facility]):  1. Gill decompression, right L5-S1 with complete excision of the facet  and pars.  2. Posterolateral arthrodesis, L5-S1 with autograft bone from  decompression as well as DBX mix.  3. Posterior segmental instrumentation, L5-S1, with pedicle screw  fixation.  4. Complete diskectomy, L5-S1, with implantation of intervertebral  biomechanical device, the Synthes PEEK interbody cage, size 12.    BP Readings from Last 3 Encounters:  08/29/13 130/82  07/05/13 120/70  09/10/12 124/66   Wt Readings from Last 3 Encounters:  08/29/13 125 lb (56.7 kg)  07/05/13 124 lb (56.246 kg)  09/01/12 131 lb 1.6 oz (59.467 kg)     Review of Systems  Constitutional: Negative for chills, activity change, appetite change, fatigue and unexpected weight change.  HENT: Negative for congestion, mouth sores and sinus pressure.   Eyes: Negative for visual disturbance.  Respiratory: Negative for cough and chest tightness.   Gastrointestinal: Negative for nausea and abdominal pain.  Genitourinary: Negative for frequency, difficulty urinating and vaginal pain.  Musculoskeletal: Positive for arthralgias, gait problem and myalgias. Negative for back pain.  Skin: Negative for pallor and rash.  Neurological: Negative for dizziness, tremors, weakness, numbness and headaches.  Psychiatric/Behavioral: Positive for sleep disturbance and dysphoric mood. Negative for suicidal ideas, hallucinations, confusion, self-injury and agitation. The patient is nervous/anxious. The patient is not hyperactive.        Depressed, stressed       Objective:   Physical Exam  Constitutional: She appears well-developed and well-nourished. No  distress.  HENT:  Head: Normocephalic.  Right Ear: External ear normal.  Left Ear: External ear normal.  Nose: Nose normal.  Mouth/Throat: Oropharynx is clear and moist.  Eyes: Conjunctivae are normal. Pupils are equal, round, and reactive to light. Right eye exhibits no discharge. Left eye exhibits no discharge.  Neck: Normal range of motion. Neck supple. No JVD present. No tracheal deviation present. No thyromegaly present.  Cardiovascular: Normal rate, regular rhythm and normal heart sounds.   Pulmonary/Chest: No stridor. No respiratory distress. She has no wheezes.  Abdominal: Soft. Bowel sounds are normal. She exhibits no distension and no mass. There is no tenderness. There is no rebound and no guarding.  Musculoskeletal: She exhibits no edema and no tenderness.  Lymphadenopathy:    She has no cervical adenopathy.  Neurological: She displays normal reflexes. No cranial nerve deficit. She exhibits normal muscle tone. Coordination normal.  Skin: No rash noted. No erythema.  Psychiatric: Her behavior is normal. Judgment and thought content normal.   Lab Results  Component Value Date   WBC 9.7 07/05/2013   HGB 11.8* 07/05/2013   HCT 35.2* 07/05/2013   PLT 415.0* 07/05/2013   GLUCOSE 89 07/05/2013   CHOL 203* 01/27/2012   TRIG 183.0* 01/27/2012   HDL 37.20* 01/27/2012   LDLDIRECT 137.9 01/27/2012   ALT 53* 07/05/2013   AST 82* 07/05/2013   NA 140 07/05/2013   K 3.0* 07/05/2013   CL 108 07/05/2013   CREATININE 0.7 07/05/2013   BUN 2* 07/05/2013   CO2 26 07/05/2013   TSH 0.63 07/05/2013       Assessment & Plan:

## 2013-09-08 ENCOUNTER — Telehealth: Payer: Self-pay

## 2013-09-08 NOTE — Telephone Encounter (Signed)
Zolpidem was phoned into [facility] pharmacy on 09/08/2013 at 9:00 am.   To # [phone]

## 2013-09-14 ENCOUNTER — Other Ambulatory Visit: Payer: BC Managed Care – PPO

## 2013-09-19 ENCOUNTER — Other Ambulatory Visit: Payer: Self-pay | Admitting: Internal Medicine

## 2013-09-19 ENCOUNTER — Ambulatory Visit
Admission: RE | Admit: 2013-09-19 | Discharge: 2013-09-19 | Disposition: A | Discharge status: Home / Self Care | Payer: BC Managed Care – PPO | Source: Ambulatory Visit | Attending: Internal Medicine | Admitting: Internal Medicine

## 2013-11-08 ENCOUNTER — Ambulatory Visit: Payer: BC Managed Care – PPO | Admitting: Internal Medicine

## 2013-11-08 DIAGNOSIS — Z0289 Encounter for other administrative examinations: Secondary | ICD-10-CM

## 2013-11-09 ENCOUNTER — Other Ambulatory Visit: Payer: Self-pay | Admitting: Internal Medicine

## 2013-11-15 ENCOUNTER — Telehealth: Payer: Self-pay | Admitting: *Deleted

## 2013-11-15 MED ORDER — BUTALBITAL-APAP-CAFFEINE 50-325-40 MG PO TABS: ORAL_TABLET | ORAL | Status: DC

## 2013-11-15 NOTE — Telephone Encounter (Signed)
Refill sent to pharmacy as ordered.  LM notifying patient on voicemail.

## 2013-11-15 NOTE — Telephone Encounter (Signed)
She was a NS on 1/28. Pls reschedule OK to fill this prescription with additional refills x0 Thank you!

## 2013-11-15 NOTE — Telephone Encounter (Signed)
Patient phoned requesting refill for her fiorocet.  Last OV with PCP 08/29/13; med last filled 08/29/13.  States pharmacy has sent in request as well.  Please advise.   CB# [phone]

## 2013-11-16 NOTE — Telephone Encounter (Signed)
Patient phoned requesting fiorocet refill again, stating pharmacy never received.  Phoned pharmacy & spoke with Erlene Quan, he stated they had NOT received refill order, Verbal order provided. Notified patient.

## 2013-11-28 ENCOUNTER — Ambulatory Visit: Payer: BC Managed Care – PPO | Admitting: Internal Medicine

## 2013-12-18 ENCOUNTER — Ambulatory Visit (INDEPENDENT_AMBULATORY_CARE_PROVIDER_SITE_OTHER): Payer: BC Managed Care – PPO | Admitting: Internal Medicine

## 2013-12-18 ENCOUNTER — Encounter: Payer: Self-pay | Admitting: Internal Medicine

## 2013-12-18 VITALS — BP 114/72 | HR 84 | Temp 97.7°F | Resp 16 | Wt 120.0 lb

## 2013-12-18 DIAGNOSIS — F329 Major depressive disorder, single episode, unspecified: Secondary | ICD-10-CM

## 2013-12-18 DIAGNOSIS — F3289 Other specified depressive episodes: Secondary | ICD-10-CM

## 2013-12-18 MED ORDER — ZOLPIDEM TARTRATE 10 MG PO TABS: 10.0000 mg | ORAL_TABLET | Freq: Every evening | ORAL | Status: DC | PRN

## 2013-12-18 MED ORDER — CYANOCOBALAMIN 1000 MCG/ML IJ SOLN: 1000.0000 ug | INTRAMUSCULAR | Status: DC

## 2013-12-18 MED ORDER — METHOCARBAMOL 500 MG PO TABS: 500.0000 mg | ORAL_TABLET | Freq: Three times a day (TID) | ORAL | Status: DC | PRN

## 2013-12-18 MED ORDER — BUTALBITAL-APAP-CAFFEINE 50-325-40 MG PO TABS: ORAL_TABLET | ORAL | Status: DC

## 2013-12-18 MED ORDER — ALBUTEROL SULFATE HFA 108 (90 BASE) MCG/ACT IN AERS: 2.0000 | INHALATION_SPRAY | Freq: Four times a day (QID) | RESPIRATORY_TRACT | Status: DC

## 2013-12-18 MED ORDER — VENLAFAXINE HCL 75 MG PO TABS: 225.0000 mg | ORAL_TABLET | Freq: Every morning | ORAL | Status: DC

## 2013-12-18 MED ORDER — GABAPENTIN 300 MG PO CAPS: ORAL_CAPSULE | ORAL | Status: DC

## 2013-12-18 MED ORDER — ALPRAZOLAM 0.5 MG PO TABS: 0.5000 mg | ORAL_TABLET | Freq: Two times a day (BID) | ORAL | Status: DC | PRN

## 2013-12-18 MED ORDER — TOPIRAMATE 100 MG PO TABS: ORAL_TABLET | ORAL | Status: DC

## 2013-12-18 NOTE — Assessment & Plan Note (Addendum)
   3/15 husband was operated on for brain cancer. Discussed

## 2013-12-18 NOTE — Progress Notes (Signed)
Subjective:    HPI   Vanessa Payne's husband was operated for brain ca 3/15 - she is stressed out...  The patient is here to follow up on chronic depression, insomnia, anxiety, headaches and chronic moderate fibromyalgia symptoms controlled with medicines, diet and exercise. She is seeing Dr Hardin Negus for pain management.  Working 40 h per week in [place] now   C/o LBP. Low back surgery 11/13 (Dr Rolena Infante - [facility]):  1. Gill decompression, right L5-S1 with complete excision of the facet  and pars.  2. Posterolateral arthrodesis, L5-S1 with autograft bone from  decompression as well as DBX mix.  3. Posterior segmental instrumentation, L5-S1, with pedicle screw  fixation.  4. Complete diskectomy, L5-S1, with implantation of intervertebral  biomechanical device, the Synthes PEEK interbody cage, size 12.    BP Readings from Last 3 Encounters:  12/18/13 114/72  08/29/13 130/82  07/05/13 120/70   Wt Readings from Last 3 Encounters:  12/18/13 120 lb (54.432 kg)  08/29/13 125 lb (56.7 kg)  07/05/13 124 lb (56.246 kg)     Review of Systems  Constitutional: Negative for chills, activity change, appetite change, fatigue and unexpected weight change.  HENT: Negative for congestion, mouth sores and sinus pressure.   Eyes: Negative for visual disturbance.  Respiratory: Negative for cough and chest tightness.   Gastrointestinal: Negative for nausea and abdominal pain.  Genitourinary: Negative for frequency, difficulty urinating and vaginal pain.  Musculoskeletal: Positive for arthralgias, gait problem and myalgias. Negative for back pain.  Skin: Negative for pallor and rash.  Neurological: Negative for dizziness, tremors, weakness, numbness and headaches.  Psychiatric/Behavioral: Positive for sleep disturbance and dysphoric mood. Negative for suicidal ideas, hallucinations, confusion, self-injury and agitation. The patient is nervous/anxious. The patient is not hyperactive.    Depressed, stressed       Objective:   Physical Exam  Constitutional: She appears well-developed and well-nourished. No distress.  HENT:  Head: Normocephalic.  Right Ear: External ear normal.  Left Ear: External ear normal.  Nose: Nose normal.  Mouth/Throat: Oropharynx is clear and moist.  Eyes: Conjunctivae are normal. Pupils are equal, round, and reactive to light. Right eye exhibits no discharge. Left eye exhibits no discharge.  Neck: Normal range of motion. Neck supple. No JVD present. No tracheal deviation present. No thyromegaly present.  Cardiovascular: Normal rate, regular rhythm and normal heart sounds.   Pulmonary/Chest: No stridor. No respiratory distress. She has no wheezes.  Abdominal: Soft. Bowel sounds are normal. She exhibits no distension and no mass. There is no tenderness. There is no rebound and no guarding.  Musculoskeletal: She exhibits no edema and no tenderness.  Lymphadenopathy:    She has no cervical adenopathy.  Neurological: She displays normal reflexes. No cranial nerve deficit. She exhibits normal muscle tone. Coordination normal.  Skin: No rash noted. No erythema.  Psychiatric: Her behavior is normal. Judgment and thought content normal.   Lab Results  Component Value Date   WBC 9.7 07/05/2013   HGB 11.8* 07/05/2013   HCT 35.2* 07/05/2013   PLT 415.0* 07/05/2013   GLUCOSE 89 07/05/2013   CHOL 203* 01/27/2012   TRIG 183.0* 01/27/2012   HDL 37.20* 01/27/2012   LDLDIRECT 137.9 01/27/2012   ALT 53* 07/05/2013   AST 82* 07/05/2013   NA 140 07/05/2013   K 3.0* 07/05/2013   CL 108 07/05/2013   CREATININE 0.7 07/05/2013   BUN 2* 07/05/2013   CO2 26 07/05/2013   TSH 0.63 07/05/2013  Assessment & Plan:

## 2013-12-18 NOTE — Progress Notes (Signed)
Pre visit review using our clinic review tool, if applicable. No additional management support is needed unless otherwise documented below in the visit note. 

## 2014-02-05 ENCOUNTER — Other Ambulatory Visit: Payer: Self-pay | Admitting: Internal Medicine

## 2014-02-07 NOTE — Telephone Encounter (Signed)
Notified pharmacy spoke with Louie Casa gave md approval.../lmb

## 2014-02-28 ENCOUNTER — Other Ambulatory Visit: Payer: Self-pay | Admitting: Internal Medicine

## 2014-03-02 ENCOUNTER — Other Ambulatory Visit: Payer: Self-pay | Admitting: Internal Medicine

## 2014-03-02 NOTE — Telephone Encounter (Signed)
OV q 3 mo 

## 2014-04-01 ENCOUNTER — Other Ambulatory Visit: Payer: Self-pay | Admitting: Internal Medicine

## 2014-04-03 ENCOUNTER — Other Ambulatory Visit: Payer: Self-pay | Admitting: Internal Medicine

## 2014-04-04 NOTE — Telephone Encounter (Signed)
Needs OV q 3 mo 

## 2014-04-11 LAB — HM PAP SMEAR

## 2014-04-11 LAB — HM MAMMOGRAPHY

## 2014-04-25 ENCOUNTER — Encounter: Payer: Self-pay | Admitting: Internal Medicine

## 2014-04-25 ENCOUNTER — Ambulatory Visit (INDEPENDENT_AMBULATORY_CARE_PROVIDER_SITE_OTHER): Payer: BC Managed Care – PPO | Admitting: Internal Medicine

## 2014-04-25 ENCOUNTER — Other Ambulatory Visit: Payer: Self-pay | Admitting: Internal Medicine

## 2014-04-25 VITALS — BP 92/68 | HR 84 | Temp 98.1°F | Resp 16 | Wt 114.0 lb

## 2014-04-25 DIAGNOSIS — E538 Deficiency of other specified B group vitamins: Secondary | ICD-10-CM

## 2014-04-25 DIAGNOSIS — F3289 Other specified depressive episodes: Secondary | ICD-10-CM

## 2014-04-25 DIAGNOSIS — G43119 Migraine with aura, intractable, without status migrainosus: Secondary | ICD-10-CM

## 2014-04-25 DIAGNOSIS — M5430 Sciatica, unspecified side: Secondary | ICD-10-CM

## 2014-04-25 DIAGNOSIS — M5441 Lumbago with sciatica, right side: Secondary | ICD-10-CM

## 2014-04-25 DIAGNOSIS — G43819 Other migraine, intractable, without status migrainosus: Secondary | ICD-10-CM

## 2014-04-25 DIAGNOSIS — F329 Major depressive disorder, single episode, unspecified: Secondary | ICD-10-CM

## 2014-04-25 MED ORDER — BUTALBITAL-APAP-CAFFEINE 50-325-40 MG PO TABS: ORAL_TABLET | ORAL | Status: DC

## 2014-04-25 MED ORDER — TOPIRAMATE 100 MG PO TABS: 50.0000 mg | ORAL_TABLET | Freq: Every day | ORAL | Status: DC

## 2014-04-25 MED ORDER — GABAPENTIN 300 MG PO CAPS: ORAL_CAPSULE | ORAL | Status: DC

## 2014-04-25 MED ORDER — MIRTAZAPINE 30 MG PO TABS: ORAL_TABLET | ORAL | Status: DC

## 2014-04-25 MED ORDER — VENLAFAXINE HCL 75 MG PO TABS: ORAL_TABLET | ORAL | Status: DC

## 2014-04-25 NOTE — Progress Notes (Signed)
Subjective:    HPI   Vanessa Payne's husband was operated for stage 4 brain ca 3/15 - she is stressed out...  The patient is here to follow up on chronic depression, insomnia, anxiety, headaches and chronic moderate fibromyalgia symptoms controlled with medicines, diet and exercise. She is seeing Dr Hardin Negus for pain management.  Working 40 h per week in [place] now   C/o LBP. Low back surgery 11/13 (Dr Rolena Infante - [facility]):  1. Gill decompression, right L5-S1 with complete excision of the facet  and pars.  2. Posterolateral arthrodesis, L5-S1 with autograft bone from  decompression as well as DBX mix.  3. Posterior segmental instrumentation, L5-S1, with pedicle screw  fixation.  4. Complete diskectomy, L5-S1, with implantation of intervertebral  biomechanical device, the Synthes PEEK interbody cage, size 12.    BP Readings from Last 3 Encounters:  04/25/14 92/68  12/18/13 114/72  08/29/13 130/82   Wt Readings from Last 3 Encounters:  04/25/14 114 lb (51.71 kg)  12/18/13 120 lb (54.432 kg)  08/29/13 125 lb (56.7 kg)     Review of Systems  Constitutional: Negative for chills, activity change, appetite change, fatigue and unexpected weight change.  HENT: Negative for congestion, mouth sores and sinus pressure.   Eyes: Negative for visual disturbance.  Respiratory: Negative for cough and chest tightness.   Gastrointestinal: Negative for nausea and abdominal pain.  Genitourinary: Negative for frequency, difficulty urinating and vaginal pain.  Musculoskeletal: Positive for arthralgias, gait problem and myalgias. Negative for back pain.  Skin: Negative for pallor and rash.  Neurological: Negative for dizziness, tremors, weakness, numbness and headaches.  Psychiatric/Behavioral: Positive for sleep disturbance and dysphoric mood. Negative for suicidal ideas, hallucinations, confusion, self-injury and agitation. The patient is nervous/anxious. The patient is not hyperactive.    Depressed, stressed       Objective:   Physical Exam  Constitutional: She appears well-developed and well-nourished. No distress.  Thin Looks tired  HENT:  Head: Normocephalic.  Right Ear: External ear normal.  Left Ear: External ear normal.  Nose: Nose normal.  Mouth/Throat: Oropharynx is clear and moist.  Eyes: Conjunctivae are normal. Pupils are equal, round, and reactive to light. Right eye exhibits no discharge. Left eye exhibits no discharge.  Neck: Normal range of motion. Neck supple. No JVD present. No tracheal deviation present. No thyromegaly present.  Cardiovascular: Normal rate, regular rhythm and normal heart sounds.   Pulmonary/Chest: No stridor. No respiratory distress. She has no wheezes.  Abdominal: Soft. Bowel sounds are normal. She exhibits no distension and no mass. There is no tenderness. There is no rebound and no guarding.  Musculoskeletal: She exhibits no edema and no tenderness.  Lymphadenopathy:    She has no cervical adenopathy.  Neurological: She displays normal reflexes. No cranial nerve deficit. She exhibits normal muscle tone. Coordination normal.  Skin: No rash noted. No erythema.  Psychiatric: Her behavior is normal. Judgment and thought content normal.  Sad and depressed   Lab Results  Component Value Date   WBC 9.7 07/05/2013   HGB 11.8* 07/05/2013   HCT 35.2* 07/05/2013   PLT 415.0* 07/05/2013   GLUCOSE 89 07/05/2013   CHOL 203* 01/27/2012   TRIG 183.0* 01/27/2012   HDL 37.20* 01/27/2012   LDLDIRECT 137.9 01/27/2012   ALT 53* 07/05/2013   AST 82* 07/05/2013   NA 140 07/05/2013   K 3.0* 07/05/2013   CL 108 07/05/2013   CREATININE 0.7 07/05/2013   BUN 2* 07/05/2013   CO2 26  07/05/2013   TSH 0.63 07/05/2013       Assessment & Plan:

## 2014-04-25 NOTE — Assessment & Plan Note (Signed)
Continue with current prescription therapy as reflected on the Med list.  

## 2014-04-25 NOTE — Progress Notes (Signed)
Pre visit review using our clinic review tool, if applicable. No additional management support is needed unless otherwise documented below in the visit note. 

## 2014-04-25 NOTE — Patient Instructions (Signed)
Wean off Robaxin Wean off Topamax Reduce Effexor to 75 mg daily Start Remeron (Mirtazapine) daily

## 2014-04-25 NOTE — Assessment & Plan Note (Signed)
See Rx change

## 2014-04-25 NOTE — Assessment & Plan Note (Signed)
Chronic stress 9/14 better 3/15 husband was operated on for brain cancer Pt is loosing wt Start Remeron, reduce Topamax, Effexor

## 2014-04-27 ENCOUNTER — Telehealth: Payer: Self-pay | Admitting: Internal Medicine

## 2014-04-27 ENCOUNTER — Other Ambulatory Visit: Payer: Self-pay | Admitting: Internal Medicine

## 2014-04-27 NOTE — Telephone Encounter (Signed)
[facility] is needing clarification on instructions for topamax

## 2014-05-01 ENCOUNTER — Other Ambulatory Visit (INDEPENDENT_AMBULATORY_CARE_PROVIDER_SITE_OTHER): Payer: BC Managed Care – PPO

## 2014-05-01 DIAGNOSIS — M5441 Lumbago with sciatica, right side: Secondary | ICD-10-CM

## 2014-05-01 DIAGNOSIS — G43819 Other migraine, intractable, without status migrainosus: Secondary | ICD-10-CM

## 2014-05-01 DIAGNOSIS — G43119 Migraine with aura, intractable, without status migrainosus: Secondary | ICD-10-CM

## 2014-05-01 DIAGNOSIS — E538 Deficiency of other specified B group vitamins: Secondary | ICD-10-CM

## 2014-05-01 DIAGNOSIS — F3289 Other specified depressive episodes: Secondary | ICD-10-CM

## 2014-05-01 DIAGNOSIS — M5430 Sciatica, unspecified side: Secondary | ICD-10-CM

## 2014-05-01 DIAGNOSIS — F329 Major depressive disorder, single episode, unspecified: Secondary | ICD-10-CM

## 2014-05-01 LAB — URINALYSIS
Bilirubin Urine: NEGATIVE
Hgb urine dipstick: NEGATIVE
Ketones, ur: NEGATIVE
Leukocytes, UA: NEGATIVE
Nitrite: NEGATIVE
Specific Gravity, Urine: <=1.005 — AB (ref 1.000–1.030)
Total Protein, Urine: NEGATIVE
Urine Glucose: NEGATIVE
Urobilinogen, UA: 0.2 (ref 0.0–1.0)
pH: 5.5 (ref 5.0–8.0)

## 2014-05-01 LAB — CBC WITH DIFFERENTIAL/PLATELET
Basophils Absolute: 0 10*3/uL (ref 0.0–0.1)
Basophils Relative: 0.3 % (ref 0.0–3.0)
Eosinophils Absolute: 0 10*3/uL (ref 0.0–0.7)
Eosinophils Relative: 0.1 % (ref 0.0–5.0)
HCT: 38.9 % (ref 36.0–46.0)
Hemoglobin: 12.9 g/dL (ref 12.0–15.0)
Lymphocytes Relative: 32.1 % (ref 12.0–46.0)
Lymphs Abs: 2.9 10*3/uL (ref 0.7–4.0)
MCHC: 33.1 g/dL (ref 30.0–36.0)
MCV: 92.8 fl (ref 78.0–100.0)
Monocytes Absolute: 0.7 10*3/uL (ref 0.1–1.0)
Monocytes Relative: 7.8 % (ref 3.0–12.0)
Neutro Abs: 5.3 10*3/uL (ref 1.4–7.7)
Neutrophils Relative %: 59.7 % (ref 43.0–77.0)
Platelets: 251 10*3/uL (ref 150.0–400.0)
RBC: 4.19 Mil/uL (ref 3.87–5.11)
RDW: 13.5 % (ref 11.5–15.5)
WBC: 8.9 10*3/uL (ref 4.0–10.5)

## 2014-05-01 LAB — HEPATIC FUNCTION PANEL
ALT: 12 U/L (ref 0–35)
AST: 19 U/L (ref 0–37)
Albumin: 3.2 g/dL — ABNORMAL LOW (ref 3.5–5.2)
Alkaline Phosphatase: 105 U/L (ref 39–117)
Bilirubin, Direct: 0 mg/dL (ref 0.0–0.3)
Total Bilirubin: 0.5 mg/dL (ref 0.2–1.2)
Total Protein: 6.7 g/dL (ref 6.0–8.3)

## 2014-05-01 LAB — TSH: TSH: 0.58 u[IU]/mL (ref 0.35–4.50)

## 2014-05-01 LAB — BASIC METABOLIC PANEL
BUN: 5 mg/dL — ABNORMAL LOW (ref 6–23)
CO2: 28 mEq/L (ref 19–32)
Calcium: 9 mg/dL (ref 8.4–10.5)
Chloride: 109 mEq/L (ref 96–112)
Creatinine, Ser: 0.9 mg/dL (ref 0.4–1.2)
GFR: 71.6 mL/min (ref >60.00)
Glucose, Bld: 123 mg/dL — ABNORMAL HIGH (ref 70–99)
Potassium: 3.8 mEq/L (ref 3.5–5.1)
Sodium: 142 mEq/L (ref 135–145)

## 2014-05-01 LAB — VITAMIN B12: Vitamin B-12: 1018 pg/mL — ABNORMAL HIGH (ref 211–911)

## 2014-05-04 ENCOUNTER — Ambulatory Visit (INDEPENDENT_AMBULATORY_CARE_PROVIDER_SITE_OTHER): Payer: BC Managed Care – PPO | Admitting: Internal Medicine

## 2014-05-04 ENCOUNTER — Encounter: Payer: Self-pay | Admitting: Internal Medicine

## 2014-05-04 VITALS — BP 100/72 | HR 92 | Temp 98.4°F | Resp 16 | Wt 114.0 lb

## 2014-05-04 DIAGNOSIS — G47419 Narcolepsy without cataplexy: Secondary | ICD-10-CM | POA: Insufficient documentation

## 2014-05-04 DIAGNOSIS — R55 Syncope and collapse: Secondary | ICD-10-CM | POA: Insufficient documentation

## 2014-05-04 DIAGNOSIS — F3289 Other specified depressive episodes: Secondary | ICD-10-CM

## 2014-05-04 DIAGNOSIS — F329 Major depressive disorder, single episode, unspecified: Secondary | ICD-10-CM

## 2014-05-04 DIAGNOSIS — E538 Deficiency of other specified B group vitamins: Secondary | ICD-10-CM

## 2014-05-04 DIAGNOSIS — F411 Generalized anxiety disorder: Secondary | ICD-10-CM

## 2014-05-04 MED ORDER — ALPRAZOLAM 0.5 MG PO TABS: 0.2500 mg | ORAL_TABLET | Freq: Two times a day (BID) | ORAL | Status: DC | PRN

## 2014-05-04 NOTE — Assessment & Plan Note (Addendum)
7/15: Possibly aggravated by meds:  she was videotaped at work staying asleep x 2 hrs  Dr Hardin Negus reduced Vanessa Payne dose Wean off and stop Fioricet please. Stop ambien. Reduce Xanax. Goals: to minimize her meds, rest No driving!  EKG  Rest  Labs ok

## 2014-05-04 NOTE — Progress Notes (Signed)
Subjective:    HPI  C/o passing out at work on [employer] a week ago and several times more at home (10). She was suspended at work... (she was videotaped at work staying asleep x 2 hrs)  She has an appt w/a Neurologist on 8/17 (ref by Dr Hardin Negus) She has not started Remeron yet.  Li's husband was operated for stage 4 brain ca 3/15 - she is stressed out...  The patient is here to follow up on chronic depression, insomnia, anxiety, headaches and chronic moderate fibromyalgia symptoms controlled with medicines, diet and exercise. She is seeing Dr Hardin Negus for pain management.  Working 40 h per week in [place] now   C/o LBP. Low back surgery 11/13 (Dr Rolena Infante - [facility]):  1. Gill decompression, right L5-S1 with complete excision of the facet  and pars.  2. Posterolateral arthrodesis, L5-S1 with autograft bone from  decompression as well as DBX mix.  3. Posterior segmental instrumentation, L5-S1, with pedicle screw  fixation.  4. Complete diskectomy, L5-S1, with implantation of intervertebral  biomechanical device, the Synthes PEEK interbody cage, size 12.    BP Readings from Last 3 Encounters:  05/04/14 98/70  04/25/14 92/68  12/18/13 114/72   Wt Readings from Last 3 Encounters:  05/04/14 114 lb (51.71 kg)  04/25/14 114 lb (51.71 kg)  12/18/13 120 lb (54.432 kg)     Review of Systems  Constitutional: Positive for fatigue. Negative for chills, activity change, appetite change and unexpected weight change.  HENT: Negative for congestion, mouth sores and sinus pressure.   Eyes: Negative for visual disturbance.  Respiratory: Negative for cough and chest tightness.   Gastrointestinal: Negative for nausea and abdominal pain.  Genitourinary: Negative for frequency, difficulty urinating and vaginal pain.  Musculoskeletal: Positive for arthralgias, gait problem and myalgias. Negative for back pain.  Skin: Negative for pallor and rash.  Neurological: Positive for light-headedness  and headaches. Negative for dizziness, tremors, weakness and numbness.  Psychiatric/Behavioral: Positive for sleep disturbance and dysphoric mood. Negative for suicidal ideas, hallucinations, confusion, self-injury and agitation. The patient is nervous/anxious. The patient is not hyperactive.        Depressed, stressed       Objective:   Physical Exam  Constitutional: She appears well-developed and well-nourished. No distress.  Thin Looks tired, better today  HENT:  Head: Normocephalic.  Right Ear: External ear normal.  Left Ear: External ear normal.  Nose: Nose normal.  Mouth/Throat: Oropharynx is clear and moist.  Eyes: Conjunctivae are normal. Pupils are equal, round, and reactive to light. Right eye exhibits no discharge. Left eye exhibits no discharge.  Neck: Normal range of motion. Neck supple. No JVD present. No tracheal deviation present. No thyromegaly present.  Cardiovascular: Normal rate, regular rhythm and normal heart sounds.   Pulmonary/Chest: No stridor. No respiratory distress. She has no wheezes.  Abdominal: Soft. Bowel sounds are normal. She exhibits no distension and no mass. There is no tenderness. There is no rebound and no guarding.  Musculoskeletal: She exhibits no edema and no tenderness.  Lymphadenopathy:    She has no cervical adenopathy.  Neurological: She displays normal reflexes. No cranial nerve deficit. She exhibits normal muscle tone. Coordination normal.  Skin: No rash noted. No erythema.  Psychiatric: Her behavior is normal. Judgment and thought content normal.  Sad and depressed   Lab Results  Component Value Date   WBC 8.9 05/01/2014   HGB 12.9 05/01/2014   HCT 38.9 05/01/2014   PLT 251.0 05/01/2014  GLUCOSE 123* 05/01/2014   CHOL 203* 01/27/2012   TRIG 183.0* 01/27/2012   HDL 37.20* 01/27/2012   LDLDIRECT 137.9 01/27/2012   ALT 12 05/01/2014   AST 19 05/01/2014   NA 142 05/01/2014   K 3.8 05/01/2014   CL 109 05/01/2014   CREATININE 0.9 05/01/2014    BUN 5* 05/01/2014   CO2 28 05/01/2014   TSH 0.58 05/01/2014    EKG   Assessment & Plan:

## 2014-05-04 NOTE — Assessment & Plan Note (Addendum)
7/15: sounds more like narcolepsy (she was videotaped at work staying asleep x 2 hrs) Dr Hardin Negus reduced Vanessa Payne dose Wean off and stop Fioricet please. Stop ambien. Reduce Xanax. No driving!  EKG  Rest  Labs ok

## 2014-05-04 NOTE — Patient Instructions (Addendum)
Wean off and stop Fioricet please. Stop Ambien No driving!

## 2014-05-04 NOTE — Progress Notes (Signed)
Pre visit review using our clinic review tool, if applicable. No additional management support is needed unless otherwise documented below in the visit note. 

## 2014-05-05 NOTE — Assessment & Plan Note (Signed)
Continue with current prescription therapy as reflected on the Med list.  

## 2014-05-05 NOTE — Assessment & Plan Note (Signed)
Worse  Goals: to minimize her meds, rest

## 2014-05-05 NOTE — Assessment & Plan Note (Signed)
Start Remeron Counseling

## 2014-05-18 ENCOUNTER — Encounter: Payer: Self-pay | Admitting: Internal Medicine

## 2014-05-18 ENCOUNTER — Ambulatory Visit (INDEPENDENT_AMBULATORY_CARE_PROVIDER_SITE_OTHER): Payer: BC Managed Care – PPO | Admitting: Internal Medicine

## 2014-05-18 VITALS — BP 107/69 | HR 98 | Temp 97.8°F | Wt 114.0 lb

## 2014-05-18 DIAGNOSIS — M545 Low back pain, unspecified: Secondary | ICD-10-CM

## 2014-05-18 DIAGNOSIS — R5381 Other malaise: Secondary | ICD-10-CM

## 2014-05-18 DIAGNOSIS — G47419 Narcolepsy without cataplexy: Secondary | ICD-10-CM

## 2014-05-18 DIAGNOSIS — F411 Generalized anxiety disorder: Secondary | ICD-10-CM

## 2014-05-18 DIAGNOSIS — E538 Deficiency of other specified B group vitamins: Secondary | ICD-10-CM

## 2014-05-18 DIAGNOSIS — R5383 Other fatigue: Secondary | ICD-10-CM

## 2014-05-18 MED ORDER — BUTALBITAL-APAP-CAFFEINE 50-325-40 MG PO TABS: ORAL_TABLET | ORAL | Status: DC

## 2014-05-18 NOTE — Progress Notes (Signed)
Subjective:    HPI  F/u passing out at work on Tue 3 weeks ago and several times more at home (10). She was suspended at work... (she was videotaped at work staying asleep x 2 hrs) -- no relapse. Feeling some better.  She has an appt w/a Neurologist on 8/17 (ref by Dr Hardin Negus) She has not started Remeron yet.  Vanessa Payne's husband was operated for stage 4 brain ca 3/15 - she is stressed out...  The patient is here to follow up on chronic depression, insomnia, anxiety, headaches and chronic moderate fibromyalgia symptoms controlled with medicines, diet and exercise. She is seeing Dr Hardin Negus for pain management.  Was working 40 h per week in [place] now - has been at home x weeks  C/o LBP. Low back surgery 11/13 (Dr Rolena Infante - [facility]):  1. Gill decompression, right L5-S1 with complete excision of the facet  and pars.  2. Posterolateral arthrodesis, L5-S1 with autograft bone from  decompression as well as DBX mix.  3. Posterior segmental instrumentation, L5-S1, with pedicle screw  fixation.  4. Complete diskectomy, L5-S1, with implantation of intervertebral  biomechanical device, the Synthes PEEK interbody cage, size 12.    BP Readings from Last 3 Encounters:  05/18/14 107/69  05/04/14 100/72  04/25/14 92/68   Wt Readings from Last 3 Encounters:  05/18/14 114 lb (51.71 kg)  05/04/14 114 lb (51.71 kg)  04/25/14 114 lb (51.71 kg)     Review of Systems  Constitutional: Positive for fatigue. Negative for chills, activity change, appetite change and unexpected weight change.  HENT: Negative for congestion, mouth sores and sinus pressure.   Eyes: Negative for visual disturbance.  Respiratory: Negative for cough and chest tightness.   Gastrointestinal: Negative for nausea and abdominal pain.  Genitourinary: Negative for frequency, difficulty urinating and vaginal pain.  Musculoskeletal: Positive for arthralgias, gait problem and myalgias. Negative for back pain.  Skin: Negative  for pallor and rash.  Neurological: Positive for light-headedness and headaches. Negative for dizziness, tremors, weakness and numbness.  Psychiatric/Behavioral: Positive for sleep disturbance and dysphoric mood. Negative for suicidal ideas, hallucinations, confusion, self-injury and agitation. The patient is nervous/anxious. The patient is not hyperactive.        Depressed, stressed       Objective:   Physical Exam  Constitutional: She appears well-developed and well-nourished. No distress.  Thin Looks tired, better today  HENT:  Head: Normocephalic.  Right Ear: External ear normal.  Left Ear: External ear normal.  Nose: Nose normal.  Mouth/Throat: Oropharynx is clear and moist.  Eyes: Conjunctivae are normal. Pupils are equal, round, and reactive to light. Right eye exhibits no discharge. Left eye exhibits no discharge.  Neck: Normal range of motion. Neck supple. No JVD present. No tracheal deviation present. No thyromegaly present.  Cardiovascular: Normal rate, regular rhythm and normal heart sounds.   Pulmonary/Chest: No stridor. No respiratory distress. She has no wheezes.  Abdominal: Soft. Bowel sounds are normal. She exhibits no distension and no mass. There is no tenderness. There is no rebound and no guarding.  Musculoskeletal: She exhibits no edema and no tenderness.  Lymphadenopathy:    She has no cervical adenopathy.  Neurological: She displays normal reflexes. No cranial nerve deficit. She exhibits normal muscle tone. Coordination normal.  Skin: No rash noted. No erythema.  Psychiatric: Her behavior is normal. Judgment and thought content normal.  Sad and depressed   Lab Results  Component Value Date   WBC 8.9 05/01/2014  HGB 12.9 05/01/2014   HCT 38.9 05/01/2014   PLT 251.0 05/01/2014   GLUCOSE 123* 05/01/2014   CHOL 203* 01/27/2012   TRIG 183.0* 01/27/2012   HDL 37.20* 01/27/2012   LDLDIRECT 137.9 01/27/2012   ALT 12 05/01/2014   AST 19 05/01/2014   NA 142 05/01/2014    K 3.8 05/01/2014   CL 109 05/01/2014   CREATININE 0.9 05/01/2014   BUN 5* 05/01/2014   CO2 28 05/01/2014   TSH 0.58 05/01/2014       Assessment & Plan:

## 2014-05-18 NOTE — Progress Notes (Signed)
Pre visit review using our clinic review tool, if applicable. No additional management support is needed unless otherwise documented below in the visit note. 

## 2014-05-18 NOTE — Assessment & Plan Note (Addendum)
7/15: Possibly aggravated by meds:  she was videotaped at work staying asleep x 2 hrs.  Goals: to minimize her meds, rest  No driving!  Neurol appt on 8/17 pending. No relapse - better... To work in 2 wks if ok

## 2014-05-18 NOTE — Assessment & Plan Note (Signed)
Continue with current prescription therapy as reflected on the Med list.  

## 2014-05-18 NOTE — Assessment & Plan Note (Signed)
Chronically overworked and stress and meds side effects See meds change

## 2014-05-18 NOTE — Assessment & Plan Note (Signed)
Reducing Benzodiazepines slowly

## 2014-05-18 NOTE — Assessment & Plan Note (Signed)
Per Dr Hardin Negus - pain clinic

## 2014-05-24 ENCOUNTER — Ambulatory Visit: Payer: BC Managed Care – PPO | Admitting: Internal Medicine

## 2014-05-28 ENCOUNTER — Encounter: Payer: Self-pay | Admitting: Neurology

## 2014-05-28 ENCOUNTER — Ambulatory Visit (INDEPENDENT_AMBULATORY_CARE_PROVIDER_SITE_OTHER): Payer: BC Managed Care – PPO | Admitting: Neurology

## 2014-05-28 ENCOUNTER — Telehealth: Payer: Self-pay | Admitting: Neurology

## 2014-05-28 VITALS — BP 116/84 | HR 109 | Ht 65.0 in | Wt 116.0 lb

## 2014-05-28 DIAGNOSIS — R404 Transient alteration of awareness: Secondary | ICD-10-CM

## 2014-05-28 DIAGNOSIS — G4719 Other hypersomnia: Secondary | ICD-10-CM

## 2014-05-28 DIAGNOSIS — G4710 Hypersomnia, unspecified: Secondary | ICD-10-CM

## 2014-05-28 NOTE — Telephone Encounter (Signed)
Received Dr. Amparo Bristol referral for PSG with MSLT. The patient is on 150 mg effexor daily, which will influence her MSLT results. She is severely depressed and on pain medications, and appears not able to wean off prior to a sleep test.   I spoke with Dr. Delice Lesch by phone today . I will order the preceding PSG as a SPLIT with CO2 monitoring study , as this patient has risk factors for central apnea , rather than OSA.  If the AHI is  less than 10 , will stay for MSLT , and we will order HLA  Narcolepsy panel to be drawn during the day time study by RN. Larey Seat , MD

## 2014-05-28 NOTE — Patient Instructions (Signed)
1. Schedule MRI brain with and without contrast 2. Schedule sleep study with MSLT 3. Schedule routine EEG 4. As per [place] driving laws, for any episode of loss of consciousness/awareness, one should not drive until 6 months event-free
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
## 2014-05-30 ENCOUNTER — Encounter: Payer: Self-pay | Admitting: Neurology

## 2014-05-30 DIAGNOSIS — R404 Transient alteration of awareness: Secondary | ICD-10-CM | POA: Insufficient documentation

## 2014-05-30 DIAGNOSIS — G4710 Hypersomnia, unspecified: Secondary | ICD-10-CM | POA: Insufficient documentation

## 2014-06-01 ENCOUNTER — Ambulatory Visit: Payer: BC Managed Care – PPO | Admitting: Internal Medicine

## 2014-06-05 DIAGNOSIS — Z0279 Encounter for issue of other medical certificate: Secondary | ICD-10-CM

## 2014-06-06 ENCOUNTER — Ambulatory Visit (HOSPITAL_COMMUNITY)
Admission: RE | Admit: 2014-06-06 | Discharge: 2014-06-06 | Disposition: A | Discharge status: Home / Self Care | Payer: BC Managed Care – PPO | Source: Ambulatory Visit | Attending: Neurology | Admitting: Neurology

## 2014-06-06 DIAGNOSIS — J3489 Other specified disorders of nose and nasal sinuses: Secondary | ICD-10-CM | POA: Diagnosis not present

## 2014-06-06 DIAGNOSIS — R404 Transient alteration of awareness: Secondary | ICD-10-CM | POA: Diagnosis present

## 2014-06-06 MED ORDER — GADOBENATE DIMEGLUMINE 529 MG/ML IV SOLN
10.0000 mL | Freq: Once | INTRAVENOUS | Status: AC | PRN
  Administered 2014-06-06: 10 mL via INTRAVENOUS

## 2014-06-11 ENCOUNTER — Ambulatory Visit (INDEPENDENT_AMBULATORY_CARE_PROVIDER_SITE_OTHER): Payer: BC Managed Care – PPO | Admitting: Psychology

## 2014-06-11 DIAGNOSIS — F331 Major depressive disorder, recurrent, moderate: Secondary | ICD-10-CM

## 2014-06-11 DIAGNOSIS — F411 Generalized anxiety disorder: Secondary | ICD-10-CM

## 2014-06-12 ENCOUNTER — Ambulatory Visit (INDEPENDENT_AMBULATORY_CARE_PROVIDER_SITE_OTHER): Payer: BC Managed Care – PPO | Admitting: Neurology

## 2014-06-12 DIAGNOSIS — R404 Transient alteration of awareness: Secondary | ICD-10-CM

## 2014-06-14 NOTE — Procedures (Signed)
ELECTROENCEPHALOGRAM REPORT  Date of Study: 06/12/2014  Patient's Name: Vanessa Payne MRN: [id number] Date of Birth: November 25, 1970  Referring Provider: Dr. Ellouise Newer  Clinical History: This is a 43 year old woman with recurrent episodes where she is described to speak nonsense, become unresponsive and appear to fall asleep  Medications: Gabapentin, Nucynta, Remeron, Fioricet  Technical Summary: A multichannel digital EEG recording measured by the international 10-20 system with electrodes applied with paste and impedances below 5000 ohms performed in our laboratory with EKG monitoring in an awake and drowsy patient.  Hyperventilation and photic stimulation were performed.  The digital EEG was referentially recorded, reformatted, and digitally filtered in a variety of bipolar and referential montages for optimal display.    Description: The patient is awake and drowsy during the recording.  During maximal wakefulness, there is a symmetric, medium voltage 9-10 Hz posterior dominant rhythm that attenuates with eye opening.  There is slight alpha asymmetry noted with higher amplitude in the right occipital regions, within normal limits.  During drowsiness, there is an increase in theta slowing of the background.  Hyperventilation and photic stimulation did not elicit any abnormalities.  There were no epileptiform discharges or electrographic seizures seen.    EKG lead was unremarkable.  Impression: This awake and drowsy is within normal limits.  Clinical Correlation: A normal EEG does not exclude a clinical diagnosis of epilepsy.  If further clinical questions remain, prolonged EEG may be helpful.  Clinical correlation is advised.   Ellouise Newer, M.D.

## 2014-06-22 ENCOUNTER — Ambulatory Visit (INDEPENDENT_AMBULATORY_CARE_PROVIDER_SITE_OTHER): Payer: BC Managed Care – PPO | Admitting: Internal Medicine

## 2014-06-22 ENCOUNTER — Encounter: Payer: Self-pay | Admitting: Internal Medicine

## 2014-06-22 VITALS — BP 112/70 | HR 90 | Temp 98.1°F | Ht 65.0 in | Wt 121.0 lb

## 2014-06-22 DIAGNOSIS — R5383 Other fatigue: Secondary | ICD-10-CM

## 2014-06-22 DIAGNOSIS — G47419 Narcolepsy without cataplexy: Secondary | ICD-10-CM

## 2014-06-22 DIAGNOSIS — F329 Major depressive disorder, single episode, unspecified: Secondary | ICD-10-CM

## 2014-06-22 DIAGNOSIS — F17200 Nicotine dependence, unspecified, uncomplicated: Secondary | ICD-10-CM

## 2014-06-22 DIAGNOSIS — F411 Generalized anxiety disorder: Secondary | ICD-10-CM

## 2014-06-22 DIAGNOSIS — E538 Deficiency of other specified B group vitamins: Secondary | ICD-10-CM

## 2014-06-22 DIAGNOSIS — F3289 Other specified depressive episodes: Secondary | ICD-10-CM

## 2014-06-22 DIAGNOSIS — R5381 Other malaise: Secondary | ICD-10-CM

## 2014-06-22 DIAGNOSIS — Z23 Encounter for immunization: Secondary | ICD-10-CM

## 2014-06-22 MED ORDER — ALBUTEROL SULFATE 108 (90 BASE) MCG/ACT IN AEPB: 1.0000 | INHALATION_SPRAY | Freq: Four times a day (QID) | RESPIRATORY_TRACT | Status: DC | PRN

## 2014-06-22 MED ORDER — BUTALBITAL-APAP-CAFFEINE 50-325-40 MG PO TABS: ORAL_TABLET | ORAL | Status: DC
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
## 2014-06-22 NOTE — Assessment & Plan Note (Signed)
Better  

## 2014-06-22 NOTE — Assessment & Plan Note (Signed)
Continue with current prescription therapy as reflected on the Med list.  

## 2014-06-22 NOTE — Assessment & Plan Note (Signed)
Discussed.

## 2014-06-22 NOTE — Assessment & Plan Note (Signed)
Coping better Seeing a psychologist

## 2014-06-22 NOTE — Assessment & Plan Note (Signed)
Chronic  Potential benefits of a long term benzodiazepines  use as well as potential risks  and complications were explained to the patient and were aknowledged. 

## 2014-06-22 NOTE — Progress Notes (Signed)
Pre visit review using our clinic review tool, if applicable. No additional management support is needed unless otherwise documented below in the visit note. 

## 2014-06-22 NOTE — Assessment & Plan Note (Signed)
Sleep test is pending 9/24, then Neurol f/up No driving!

## 2014-06-28 ENCOUNTER — Ambulatory Visit (INDEPENDENT_AMBULATORY_CARE_PROVIDER_SITE_OTHER): Payer: BC Managed Care – PPO | Admitting: Psychology

## 2014-06-28 DIAGNOSIS — F331 Major depressive disorder, recurrent, moderate: Secondary | ICD-10-CM

## 2014-06-28 DIAGNOSIS — F411 Generalized anxiety disorder: Secondary | ICD-10-CM

## 2014-07-05 ENCOUNTER — Ambulatory Visit (INDEPENDENT_AMBULATORY_CARE_PROVIDER_SITE_OTHER): Payer: BC Managed Care – PPO | Admitting: Neurology

## 2014-07-05 DIAGNOSIS — G4710 Hypersomnia, unspecified: Secondary | ICD-10-CM

## 2014-07-05 DIAGNOSIS — G4719 Other hypersomnia: Secondary | ICD-10-CM

## 2014-07-06 ENCOUNTER — Encounter: Payer: Self-pay | Admitting: *Deleted

## 2014-07-10 ENCOUNTER — Encounter: Payer: Self-pay | Admitting: Neurology

## 2014-07-10 ENCOUNTER — Ambulatory Visit (INDEPENDENT_AMBULATORY_CARE_PROVIDER_SITE_OTHER): Payer: BC Managed Care – PPO | Admitting: Neurology

## 2014-07-10 VITALS — BP 104/70 | HR 106 | Resp 14 | Ht 65.0 in | Wt 125.0 lb

## 2014-07-10 DIAGNOSIS — G4719 Other hypersomnia: Secondary | ICD-10-CM

## 2014-07-10 DIAGNOSIS — G4710 Hypersomnia, unspecified: Secondary | ICD-10-CM

## 2014-07-11 ENCOUNTER — Telehealth: Payer: Self-pay | Admitting: Neurology

## 2014-07-11 NOTE — Telephone Encounter (Signed)
She is cleared for return to work on Monday, papers ready for pickup, thanks

## 2014-07-11 NOTE — Telephone Encounter (Signed)
Pt called yesterday while the computers were down needing a return to work note. Please prepare a return to work note and call pt at [phone] to confirm it is ready. / Sherri S.

## 2014-07-11 NOTE — Telephone Encounter (Signed)
Is she cleared to go back to work?

## 2014-07-11 NOTE — Telephone Encounter (Signed)
Lmom to return my call. 

## 2014-07-11 NOTE — Telephone Encounter (Signed)
Pt called f/u on the message from earlier. C/B [phone]

## 2014-07-12 NOTE — Telephone Encounter (Signed)
Patient came by office this morning to pick up completed disability forms.

## 2014-07-13 ENCOUNTER — Ambulatory Visit (INDEPENDENT_AMBULATORY_CARE_PROVIDER_SITE_OTHER): Payer: BC Managed Care – PPO | Admitting: Psychology

## 2014-07-13 DIAGNOSIS — F411 Generalized anxiety disorder: Secondary | ICD-10-CM

## 2014-07-13 DIAGNOSIS — F332 Major depressive disorder, recurrent severe without psychotic features: Secondary | ICD-10-CM

## 2014-07-14 ENCOUNTER — Encounter: Payer: Self-pay | Admitting: Neurology

## 2014-07-14 DIAGNOSIS — G4719 Other hypersomnia: Secondary | ICD-10-CM | POA: Insufficient documentation

## 2014-07-14 DIAGNOSIS — G4710 Hypersomnia, unspecified: Secondary | ICD-10-CM | POA: Insufficient documentation

## 2014-07-14 NOTE — Patient Instructions (Signed)
1. Continue working with your pain specialist with medication adjustment 2. Continue weaning off Fioricet to help with rebound headaches/medication overuse headaches 3. Follow-up in 3 months, call our office for any changes
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
## 2014-07-19 ENCOUNTER — Encounter: Payer: Self-pay | Admitting: *Deleted

## 2014-07-23 ENCOUNTER — Encounter (HOSPITAL_BASED_OUTPATIENT_CLINIC_OR_DEPARTMENT_OTHER): Payer: BC Managed Care – PPO

## 2014-07-25 ENCOUNTER — Ambulatory Visit: Payer: BC Managed Care – PPO | Admitting: Neurology

## 2014-07-30 ENCOUNTER — Other Ambulatory Visit: Payer: Self-pay | Admitting: Internal Medicine

## 2014-07-30 NOTE — Telephone Encounter (Signed)
Ok to Rf in PCP's absence?  

## 2014-07-30 NOTE — Telephone Encounter (Signed)
Will ok refill for #30, she should not be taking this 3 times daily as she is likely getting rebound headaches from too much medicine.

## 2014-08-14 ENCOUNTER — Other Ambulatory Visit: Payer: Self-pay | Admitting: Internal Medicine

## 2014-08-24 ENCOUNTER — Other Ambulatory Visit: Payer: Self-pay | Admitting: Internal Medicine

## 2014-08-24 ENCOUNTER — Ambulatory Visit: Payer: BC Managed Care – PPO | Admitting: Internal Medicine

## 2014-09-07 ENCOUNTER — Other Ambulatory Visit: Payer: Self-pay | Admitting: Internal Medicine

## 2014-09-18 ENCOUNTER — Other Ambulatory Visit: Payer: Self-pay | Admitting: Internal Medicine

## 2014-09-19 ENCOUNTER — Ambulatory Visit (INDEPENDENT_AMBULATORY_CARE_PROVIDER_SITE_OTHER): Payer: BC Managed Care – PPO | Admitting: Psychology

## 2014-09-19 DIAGNOSIS — F411 Generalized anxiety disorder: Secondary | ICD-10-CM

## 2014-09-19 DIAGNOSIS — F332 Major depressive disorder, recurrent severe without psychotic features: Secondary | ICD-10-CM

## 2014-09-21 NOTE — Telephone Encounter (Signed)
Called pharmacy gave md authorization...Johny Chess

## 2014-10-03 ENCOUNTER — Ambulatory Visit: Payer: BC Managed Care – PPO | Admitting: Internal Medicine

## 2014-10-08 ENCOUNTER — Other Ambulatory Visit: Payer: Self-pay | Admitting: Internal Medicine

## 2014-10-10 ENCOUNTER — Telehealth: Payer: Self-pay | Admitting: Internal Medicine

## 2014-10-10 MED ORDER — BUTALBITAL-APAP-CAFFEINE 50-325-40 MG PO TABS: 1.0000 | ORAL_TABLET | Freq: Three times a day (TID) | ORAL | Status: DC | PRN

## 2014-10-10 NOTE — Telephone Encounter (Signed)
Reduced Fioricet to q 8 h OV w/me q 3 mo pls Thx

## 2014-10-10 NOTE — Telephone Encounter (Signed)
Called pharmacy spoke with Lelan Pons gave md approval.../lmb

## 2014-10-10 NOTE — Telephone Encounter (Signed)
Refill has been called in to pt pharmacy spoke with [facility]...Johny Chess

## 2014-10-22 ENCOUNTER — Ambulatory Visit (INDEPENDENT_AMBULATORY_CARE_PROVIDER_SITE_OTHER): Payer: BLUE CROSS/BLUE SHIELD | Admitting: Psychology

## 2014-10-22 DIAGNOSIS — F411 Generalized anxiety disorder: Secondary | ICD-10-CM

## 2014-10-22 DIAGNOSIS — F332 Major depressive disorder, recurrent severe without psychotic features: Secondary | ICD-10-CM

## 2014-10-25 ENCOUNTER — Other Ambulatory Visit: Payer: Self-pay | Admitting: Internal Medicine

## 2014-10-26 NOTE — Telephone Encounter (Signed)
Needs OV.  

## 2014-10-29 ENCOUNTER — Other Ambulatory Visit: Payer: Self-pay | Admitting: Internal Medicine

## 2014-11-05 ENCOUNTER — Encounter: Payer: Self-pay | Admitting: Internal Medicine

## 2014-11-05 ENCOUNTER — Ambulatory Visit (INDEPENDENT_AMBULATORY_CARE_PROVIDER_SITE_OTHER): Payer: BLUE CROSS/BLUE SHIELD | Admitting: Internal Medicine

## 2014-11-05 VITALS — BP 132/86 | HR 95 | Temp 97.9°F | Wt 135.0 lb

## 2014-11-05 DIAGNOSIS — G43819 Other migraine, intractable, without status migrainosus: Secondary | ICD-10-CM

## 2014-11-05 DIAGNOSIS — F411 Generalized anxiety disorder: Secondary | ICD-10-CM

## 2014-11-05 DIAGNOSIS — G47419 Narcolepsy without cataplexy: Secondary | ICD-10-CM

## 2014-11-05 DIAGNOSIS — G43119 Migraine with aura, intractable, without status migrainosus: Secondary | ICD-10-CM

## 2014-11-05 DIAGNOSIS — E538 Deficiency of other specified B group vitamins: Secondary | ICD-10-CM

## 2014-11-05 MED ORDER — BUTALBITAL-APAP-CAFFEINE 50-325-40 MG PO TABS: 1.0000 | ORAL_TABLET | Freq: Three times a day (TID) | ORAL | Status: DC | PRN

## 2014-11-05 MED ORDER — BUTALBITAL-APAP-CAFFEINE 50-325-40 MG PO TABS: 1.0000 | ORAL_TABLET | Freq: Two times a day (BID) | ORAL | Status: DC | PRN

## 2014-11-05 MED ORDER — ALPRAZOLAM 0.5 MG PO TABS: 0.2500 mg | ORAL_TABLET | Freq: Two times a day (BID) | ORAL | Status: DC | PRN

## 2014-11-05 MED ORDER — GABAPENTIN 300 MG PO CAPS: ORAL_CAPSULE | ORAL | Status: DC

## 2014-11-05 MED ORDER — MIRTAZAPINE 30 MG PO TABS: ORAL_TABLET | ORAL | Status: DC

## 2014-11-05 MED ORDER — VENLAFAXINE HCL ER 150 MG PO CP24: 150.0000 mg | ORAL_CAPSULE | Freq: Every day | ORAL | Status: DC

## 2014-11-05 MED ORDER — CYANOCOBALAMIN 1000 MCG/ML IJ SOLN: 1000.0000 ug | INTRAMUSCULAR | Status: DC

## 2014-11-05 NOTE — Progress Notes (Signed)
Pre visit review using our clinic review tool, if applicable. No additional management support is needed unless otherwise documented below in the visit note. 
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
## 2014-11-06 ENCOUNTER — Telehealth: Payer: Self-pay | Admitting: Internal Medicine

## 2014-11-06 ENCOUNTER — Encounter: Payer: Self-pay | Admitting: Internal Medicine

## 2014-11-06 NOTE — Assessment & Plan Note (Signed)
Stress aggravated, chronic  Potential benefits of a long term Fioricet  use as well as potential risks  and complications were explained to the patient and were aknowledged. Reboung HAs discussed. Fioricet  - no more than 3/d prn

## 2014-11-06 NOTE — Assessment & Plan Note (Signed)
Continue with current prescription therapy as reflected on the Med list.  

## 2014-11-06 NOTE — Telephone Encounter (Signed)
emmi emailed °

## 2014-11-06 NOTE — Assessment & Plan Note (Signed)
Resolved

## 2014-12-21 ENCOUNTER — Other Ambulatory Visit: Payer: Self-pay | Admitting: Internal Medicine

## 2014-12-27 NOTE — Telephone Encounter (Signed)
Had to leave msg on pharmacy vm left msg approval.../lmb

## 2015-01-09 ENCOUNTER — Other Ambulatory Visit: Payer: Self-pay | Admitting: Internal Medicine

## 2015-01-10 NOTE — Telephone Encounter (Signed)
Called pharmacy had to leave msg on pharmacy vm left msg approval.../lmb

## 2015-02-04 ENCOUNTER — Ambulatory Visit (INDEPENDENT_AMBULATORY_CARE_PROVIDER_SITE_OTHER): Payer: BLUE CROSS/BLUE SHIELD | Admitting: Internal Medicine

## 2015-02-04 ENCOUNTER — Encounter: Payer: Self-pay | Admitting: Internal Medicine

## 2015-02-04 VITALS — BP 128/74 | HR 99 | Temp 98.0°F | Resp 16 | Wt 139.0 lb

## 2015-02-04 DIAGNOSIS — G47419 Narcolepsy without cataplexy: Secondary | ICD-10-CM

## 2015-02-04 DIAGNOSIS — G4719 Other hypersomnia: Secondary | ICD-10-CM

## 2015-02-04 DIAGNOSIS — R404 Transient alteration of awareness: Secondary | ICD-10-CM

## 2015-02-04 DIAGNOSIS — E538 Deficiency of other specified B group vitamins: Secondary | ICD-10-CM

## 2015-02-04 DIAGNOSIS — G4710 Hypersomnia, unspecified: Secondary | ICD-10-CM

## 2015-02-04 DIAGNOSIS — Z23 Encounter for immunization: Secondary | ICD-10-CM

## 2015-02-04 DIAGNOSIS — F329 Major depressive disorder, single episode, unspecified: Secondary | ICD-10-CM

## 2015-02-04 DIAGNOSIS — F32A Depression, unspecified: Secondary | ICD-10-CM

## 2015-02-04 MED ORDER — ALBUTEROL SULFATE 108 (90 BASE) MCG/ACT IN AEPB: 1.0000 | INHALATION_SPRAY | Freq: Four times a day (QID) | RESPIRATORY_TRACT | Status: DC | PRN

## 2015-02-04 MED ORDER — BUTALBITAL-APAP-CAFFEINE 50-325-40 MG PO TABS: ORAL_TABLET | ORAL | Status: DC

## 2015-02-04 NOTE — Progress Notes (Signed)
Pre visit review using our clinic review tool, if applicable. No additional management support is needed unless otherwise documented below in the visit note. 

## 2015-02-04 NOTE — Progress Notes (Signed)
Subjective:    HPI  Vanessa Payne is seeing a psychologist - feeling better Med change - helped, stable Sleep test, MRI, EEG was ok  F/u passing out at work - no relapse She is seeing a Garment/textile technologist (Dr Delice Lesch) on 8/17 (ref by Dr Hardin Negus) She is on Remeron  Vanessa Payne's husband was operated for stage 4 brain ca 3/15 - she is stressed out...he has been stable  The patient is here to follow up on chronic depression, insomnia, anxiety, headaches and chronic moderate fibromyalgia symptoms controlled with medicines, diet and exercise. She is seeing Dr Hardin Negus for pain management.    C/o LBP. Low back surgery 11/13 (Dr Rolena Infante - [facility]):  1. Gill decompression, right L5-S1 with complete excision of the facet  and pars.  2. Posterolateral arthrodesis, L5-S1 with autograft bone from  decompression as well as DBX mix.  3. Posterior segmental instrumentation, L5-S1, with pedicle screw  fixation.  4. Complete diskectomy, L5-S1, with implantation of intervertebral  biomechanical device, the Synthes PEEK interbody cage, size 12.    BP Readings from Last 3 Encounters:  02/04/15 128/74  11/05/14 132/86  07/10/14 104/70   Wt Readings from Last 3 Encounters:  02/04/15 139 lb (63.05 kg)  11/05/14 135 lb (61.236 kg)  07/10/14 125 lb (56.7 kg)     Review of Systems  Constitutional: Positive for fatigue. Negative for chills, activity change, appetite change and unexpected weight change.  HENT: Negative for congestion, mouth sores and sinus pressure.   Eyes: Negative for visual disturbance.  Respiratory: Negative for cough and chest tightness.   Gastrointestinal: Negative for nausea and abdominal pain.  Genitourinary: Negative for frequency, difficulty urinating and vaginal pain.  Musculoskeletal: Positive for gait problem. Negative for myalgias, back pain and arthralgias.       Short leg  Skin: Negative for pallor and rash.  Neurological: Positive for headaches. Negative for dizziness,  tremors, weakness, light-headedness and numbness.  Psychiatric/Behavioral: Positive for sleep disturbance and dysphoric mood. Negative for suicidal ideas, hallucinations, confusion, self-injury and agitation. The patient is nervous/anxious. The patient is not hyperactive.        Objective:   Physical Exam  Constitutional: She appears well-developed and well-nourished. No distress.  Thin Looks tired, better today  HENT:  Head: Normocephalic.  Right Ear: External ear normal.  Left Ear: External ear normal.  Nose: Nose normal.  Mouth/Throat: Oropharynx is clear and moist.  Eyes: Conjunctivae are normal. Pupils are equal, round, and reactive to light. Right eye exhibits no discharge. Left eye exhibits no discharge.  Neck: Normal range of motion. Neck supple. No JVD present. No tracheal deviation present. No thyromegaly present.  Cardiovascular: Normal rate, regular rhythm and normal heart sounds.   Pulmonary/Chest: No stridor. No respiratory distress. She has no wheezes.  Abdominal: Soft. Bowel sounds are normal. She exhibits no distension and no mass. There is no tenderness. There is no rebound and no guarding.  Musculoskeletal: She exhibits no edema or tenderness.  Lymphadenopathy:    She has no cervical adenopathy.  Neurological: She displays normal reflexes. No cranial nerve deficit. She exhibits normal muscle tone. Coordination normal.  Skin: No rash noted. No erythema.  Psychiatric: Her behavior is normal. Judgment and thought content normal.  Looks less depressed   Lab Results  Component Value Date   WBC 8.9 05/01/2014   HGB 12.9 05/01/2014   HCT 38.9 05/01/2014   PLT 251.0 05/01/2014   GLUCOSE 123* 05/01/2014   CHOL 203* 01/27/2012   TRIG  183.0* 01/27/2012   HDL 37.20* 01/27/2012   LDLDIRECT 137.9 01/27/2012   ALT 12 05/01/2014   AST 19 05/01/2014   NA 142 05/01/2014   K 3.8 05/01/2014   CL 109 05/01/2014   CREATININE 0.9 05/01/2014   BUN 5* 05/01/2014   CO2 28  05/01/2014   TSH 0.58 05/01/2014        Assessment & Plan:

## 2015-02-06 ENCOUNTER — Emergency Department (HOSPITAL_COMMUNITY)
Admission: EM | Admit: 2015-02-06 | Discharge: 2015-02-06 | Disposition: A | Discharge status: Home / Self Care | Payer: BLUE CROSS/BLUE SHIELD | Attending: Emergency Medicine | Admitting: Emergency Medicine

## 2015-02-06 ENCOUNTER — Emergency Department (HOSPITAL_COMMUNITY): Payer: BLUE CROSS/BLUE SHIELD

## 2015-02-06 ENCOUNTER — Encounter (HOSPITAL_COMMUNITY): Payer: Self-pay

## 2015-02-06 DIAGNOSIS — Y998 Other external cause status: Secondary | ICD-10-CM | POA: Diagnosis not present

## 2015-02-06 DIAGNOSIS — F329 Major depressive disorder, single episode, unspecified: Secondary | ICD-10-CM | POA: Insufficient documentation

## 2015-02-06 DIAGNOSIS — K219 Gastro-esophageal reflux disease without esophagitis: Secondary | ICD-10-CM | POA: Diagnosis not present

## 2015-02-06 DIAGNOSIS — M25562 Pain in left knee: Secondary | ICD-10-CM

## 2015-02-06 DIAGNOSIS — S4992XA Unspecified injury of left shoulder and upper arm, initial encounter: Secondary | ICD-10-CM | POA: Insufficient documentation

## 2015-02-06 DIAGNOSIS — G8929 Other chronic pain: Secondary | ICD-10-CM | POA: Diagnosis not present

## 2015-02-06 DIAGNOSIS — F419 Anxiety disorder, unspecified: Secondary | ICD-10-CM | POA: Insufficient documentation

## 2015-02-06 DIAGNOSIS — Z720 Tobacco use: Secondary | ICD-10-CM | POA: Insufficient documentation

## 2015-02-06 DIAGNOSIS — Z8739 Personal history of other diseases of the musculoskeletal system and connective tissue: Secondary | ICD-10-CM | POA: Insufficient documentation

## 2015-02-06 DIAGNOSIS — G43909 Migraine, unspecified, not intractable, without status migrainosus: Secondary | ICD-10-CM | POA: Insufficient documentation

## 2015-02-06 DIAGNOSIS — E538 Deficiency of other specified B group vitamins: Secondary | ICD-10-CM | POA: Insufficient documentation

## 2015-02-06 DIAGNOSIS — M62838 Other muscle spasm: Secondary | ICD-10-CM

## 2015-02-06 DIAGNOSIS — Z79899 Other long term (current) drug therapy: Secondary | ICD-10-CM | POA: Diagnosis not present

## 2015-02-06 DIAGNOSIS — S8991XA Unspecified injury of right lower leg, initial encounter: Secondary | ICD-10-CM | POA: Insufficient documentation

## 2015-02-06 DIAGNOSIS — Y92410 Unspecified street and highway as the place of occurrence of the external cause: Secondary | ICD-10-CM | POA: Diagnosis not present

## 2015-02-06 DIAGNOSIS — S59911A Unspecified injury of right forearm, initial encounter: Secondary | ICD-10-CM | POA: Diagnosis present

## 2015-02-06 DIAGNOSIS — Y9389 Activity, other specified: Secondary | ICD-10-CM | POA: Diagnosis not present

## 2015-02-06 DIAGNOSIS — M25512 Pain in left shoulder: Secondary | ICD-10-CM

## 2015-02-06 DIAGNOSIS — S199XXA Unspecified injury of neck, initial encounter: Secondary | ICD-10-CM | POA: Diagnosis not present

## 2015-02-06 DIAGNOSIS — S8992XA Unspecified injury of left lower leg, initial encounter: Secondary | ICD-10-CM | POA: Diagnosis not present

## 2015-02-06 DIAGNOSIS — S5011XA Contusion of right forearm, initial encounter: Secondary | ICD-10-CM

## 2015-02-06 DIAGNOSIS — M25561 Pain in right knee: Secondary | ICD-10-CM

## 2015-02-06 MED ORDER — NAPROXEN 500 MG PO TABS: 500.0000 mg | ORAL_TABLET | Freq: Two times a day (BID) | ORAL | Status: DC | PRN

## 2015-02-06 MED ORDER — CYCLOBENZAPRINE HCL 10 MG PO TABS
5.0000 mg | ORAL_TABLET | Freq: Once | ORAL | Status: AC
  Administered 2015-02-06: 5 mg via ORAL
  Filled 2015-02-06: qty 1

## 2015-02-06 MED ORDER — HYDROCODONE-ACETAMINOPHEN 5-325 MG PO TABS
1.0000 | ORAL_TABLET | Freq: Once | ORAL | Status: AC
  Administered 2015-02-06: 1 via ORAL
  Filled 2015-02-06: qty 1

## 2015-02-06 MED ORDER — HYDROCODONE-ACETAMINOPHEN 5-325 MG PO TABS: 1.0000 | ORAL_TABLET | Freq: Four times a day (QID) | ORAL | Status: DC | PRN

## 2015-02-06 MED ORDER — CYCLOBENZAPRINE HCL 10 MG PO TABS: 10.0000 mg | ORAL_TABLET | Freq: Three times a day (TID) | ORAL | Status: DC | PRN
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
## 2015-02-06 NOTE — Discharge Instructions (Signed)
Take naprosyn as directed for inflammation and pain with norco for breakthrough pain and flexeril for muscle relaxation. Do not drive or operate machinery with pain medication or muscle relaxation use. Ice to areas of soreness for the next few days and then may move to heat, no more than 20 minutes at a time for each. Expect to be sore for the next few days and follow up with primary care physician for recheck of ongoing symptoms. Return to ER for emergent changing or worsening of symptoms.     Contusion A contusion is a deep bruise. Contusions are the result of an injury that caused bleeding under the skin. The contusion may turn blue, purple, or yellow. Minor injuries will give you a painless contusion, but more severe contusions may stay painful and swollen for a few weeks.  CAUSES  A contusion is usually caused by a blow, trauma, or direct force to an area of the body. SYMPTOMS   Swelling and redness of the injured area.  Bruising of the injured area.  Tenderness and soreness of the injured area.  Pain. DIAGNOSIS  The diagnosis can be made by taking a history and physical exam. An X-ray, CT scan, or MRI may be needed to determine if there were any associated injuries, such as fractures. TREATMENT  Specific treatment will depend on what area of the body was injured. In general, the best treatment for a contusion is resting, icing, elevating, and applying cold compresses to the injured area. Over-the-counter medicines may also be recommended for pain control. Ask your caregiver what the best treatment is for your contusion. HOME CARE INSTRUCTIONS   Put ice on the injured area.  Put ice in a plastic bag.  Place a towel between your skin and the bag.  Leave the ice on for 15-20 minutes, 3-4 times a day, or as directed by your health care provider.  Only take over-the-counter or prescription medicines for pain, discomfort, or fever as directed by your caregiver. Your caregiver may  recommend avoiding anti-inflammatory medicines (aspirin, ibuprofen, and naproxen) for 48 hours because these medicines may increase bruising.  Rest the injured area.  If possible, elevate the injured area to reduce swelling. SEEK IMMEDIATE MEDICAL CARE IF:   You have increased bruising or swelling.  You have pain that is getting worse.  Your swelling or pain is not relieved with medicines. MAKE SURE YOU:   Understand these instructions.  Will watch your condition.  Will get help right away if you are not doing well or get worse. Document Released: 07/08/2005 Document Revised: 10/03/2013 Document Reviewed: 08/03/2011 [facility] Patient Information 2015 [place], [place]. This information is not intended to replace advice given to you by your health care provider. Make sure you discuss any questions you have with your health care provider.  Cryotherapy Cryotherapy means treatment with cold. Ice or gel packs can be used to reduce both pain and swelling. Ice is the most helpful within the first 24 to 48 hours after an injury or flare-up from overusing a muscle or joint. Sprains, strains, spasms, burning pain, shooting pain, and aches can all be eased with ice. Ice can also be used when recovering from surgery. Ice is effective, has very few side effects, and is safe for most people to use. PRECAUTIONS  Ice is not a safe treatment option for people with:  Raynaud phenomenon. This is a condition affecting small blood vessels in the extremities. Exposure to cold may cause your problems to return.  Cold  hypersensitivity. There are many forms of cold hypersensitivity, including:  Cold urticaria. Red, itchy hives appear on the skin when the tissues begin to warm after being iced.  Cold erythema. This is a red, itchy rash caused by exposure to cold.  Cold hemoglobinuria. Red blood cells break down when the tissues begin to warm after being iced. The hemoglobin that carry oxygen are passed into  the urine because they cannot combine with blood proteins fast enough.  Numbness or altered sensitivity in the area being iced. If you have any of the following conditions, do not use ice until you have discussed cryotherapy with your caregiver:  Heart conditions, such as arrhythmia, angina, or chronic heart disease.  High blood pressure.  Healing wounds or open skin in the area being iced.  Current infections.  Rheumatoid arthritis.  Poor circulation.  Diabetes. Ice slows the blood flow in the region it is applied. This is beneficial when trying to stop inflamed tissues from spreading irritating chemicals to surrounding tissues. However, if you expose your skin to cold temperatures for too long or without the proper protection, you can damage your skin or nerves. Watch for signs of skin damage due to cold. HOME CARE INSTRUCTIONS Follow these tips to use ice and cold packs safely.  Place a dry or damp towel between the ice and skin. A damp towel will cool the skin more quickly, so you may need to shorten the time that the ice is used.  For a more rapid response, add gentle compression to the ice.  Ice for no more than 10 to 20 minutes at a time. The bonier the area you are icing, the less time it will take to get the benefits of ice.  Check your skin after 5 minutes to make sure there are no signs of a poor response to cold or skin damage.  Rest 20 minutes or more between uses.  Once your skin is numb, you can end your treatment. You can test numbness by very lightly touching your skin. The touch should be so light that you do not see the skin dimple from the pressure of your fingertip. When using ice, most people will feel these normal sensations in this order: cold, burning, aching, and numbness.  Do not use ice on someone who cannot communicate their responses to pain, such as small children or people with dementia. HOW TO MAKE AN ICE PACK Ice packs are the most common way to  use ice therapy. Other methods include ice massage, ice baths, and cryosprays. Muscle creams that cause a cold, tingly feeling do not offer the same benefits that ice offers and should not be used as a substitute unless recommended by your caregiver. To make an ice pack, do one of the following:  Place crushed ice or a bag of frozen vegetables in a sealable plastic bag. Squeeze out the excess air. Place this bag inside another plastic bag. Slide the bag into a pillowcase or place a damp towel between your skin and the bag.  Mix 3 parts water with 1 part rubbing alcohol. Freeze the mixture in a sealable plastic bag. When you remove the mixture from the freezer, it will be slushy. Squeeze out the excess air. Place this bag inside another plastic bag. Slide the bag into a pillowcase or place a damp towel between your skin and the bag. SEEK MEDICAL CARE IF:  You develop white spots on your skin. This may give the skin a blotchy (mottled) appearance.  Your skin turns blue or pale.  Your skin becomes waxy or hard.  Your swelling gets worse. MAKE SURE YOU:   Understand these instructions.  Will watch your condition.  Will get help right away if you are not doing well or get worse. Document Released: 05/25/2011 Document Revised: 02/12/2014 Document Reviewed: 05/25/2011 [facility] Patient Information 2015 [place], [place]. This information is not intended to replace advice given to you by your health care provider. Make sure you discuss any questions you have with your health care provider.  Heat Therapy Heat therapy can help make painful, stiff muscles and joints feel better. Do not use heat on new injuries. Wait at least 48 hours after an injury to use heat. Do not use heat when you have aches or pains right after an activity. If you still have pain 3 hours after stopping the activity, then you may use heat. HOME CARE Wet heat pack  Soak a clean towel in warm water. Squeeze out the extra  water.  Put the warm, wet towel in a plastic bag.  Place a thin, dry towel between your skin and the bag.  Put the heat pack on the area for 5 minutes, and check your skin. Your skin may be pink, but it should not be red.  Leave the heat pack on the area for 15 to 30 minutes.  Repeat this every 2 to 4 hours while awake. Do not use heat while you are sleeping. Warm water bath  Fill a tub with warm water.  Place the affected body part in the tub.  Soak the area for 20 to 40 minutes.  Repeat as needed. Hot water bottle  Fill the water bottle half full with hot water.  Press out the extra air. Close the cap tightly.  Place a dry towel between your skin and the bottle.  Put the bottle on the area for 5 minutes, and check your skin. Your skin may be pink, but it should not be red.  Leave the bottle on the area for 15 to 30 minutes.  Repeat this every 2 to 4 hours while awake. Electric heating pad  Place a dry towel between your skin and the heating pad.  Set the heating pad on low heat.  Put the heating pad on the area for 10 minutes, and check your skin. Your skin may be pink, but it should not be red.  Leave the heating pad on the area for 20 to 40 minutes.  Repeat this every 2 to 4 hours while awake.  Do not lie on the heating pad.  Do not fall asleep while using the heating pad.  Do not use the heating pad near water. GET HELP RIGHT AWAY IF:  You get blisters or red skin.  Your skin is puffy (swollen), or you lose feeling (numbness) in the affected area.  You have any new problems.  Your problems are getting worse.  You have any questions or concerns. If you have any problems, stop using heat therapy until you see your doctor. MAKE SURE YOU:  Understand these instructions.  Will watch your condition.  Will get help right away if you are not doing well or get worse. Document Released: 12/21/2011 Document Reviewed: 11/21/2013 [facility] Patient  Information 2015 [place]. This information is not intended to replace advice given to you by your health care provider. Make sure you discuss any questions you have with your health care provider.
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
## 2015-02-21 NOTE — Assessment & Plan Note (Signed)
Chronic stress On Remeron, Effexor
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
## 2015-02-21 NOTE — Assessment & Plan Note (Signed)
On B12 shots 

## 2015-04-05 ENCOUNTER — Other Ambulatory Visit: Payer: Self-pay | Admitting: Internal Medicine

## 2015-04-14 ENCOUNTER — Other Ambulatory Visit: Payer: Self-pay | Admitting: Internal Medicine

## 2015-04-16 ENCOUNTER — Other Ambulatory Visit: Payer: Self-pay | Admitting: Internal Medicine

## 2015-05-08 ENCOUNTER — Encounter: Payer: Self-pay | Admitting: Internal Medicine

## 2015-05-08 ENCOUNTER — Ambulatory Visit (INDEPENDENT_AMBULATORY_CARE_PROVIDER_SITE_OTHER): Payer: BLUE CROSS/BLUE SHIELD | Admitting: Internal Medicine

## 2015-05-08 VITALS — BP 110/72 | HR 94 | Wt 131.0 lb

## 2015-05-08 DIAGNOSIS — R55 Syncope and collapse: Secondary | ICD-10-CM

## 2015-05-08 DIAGNOSIS — F411 Generalized anxiety disorder: Secondary | ICD-10-CM | POA: Diagnosis not present

## 2015-05-08 DIAGNOSIS — G4710 Hypersomnia, unspecified: Secondary | ICD-10-CM

## 2015-05-08 DIAGNOSIS — G4719 Other hypersomnia: Secondary | ICD-10-CM | POA: Diagnosis not present

## 2015-05-08 DIAGNOSIS — G43819 Other migraine, intractable, without status migrainosus: Secondary | ICD-10-CM

## 2015-05-08 DIAGNOSIS — G43119 Migraine with aura, intractable, without status migrainosus: Secondary | ICD-10-CM

## 2015-05-08 MED ORDER — BUTALBITAL-APAP-CAFFEINE 50-325-40 MG PO TABS: 1.0000 | ORAL_TABLET | Freq: Three times a day (TID) | ORAL | Status: DC | PRN

## 2015-05-08 MED ORDER — ALPRAZOLAM 0.5 MG PO TABS: 0.2500 mg | ORAL_TABLET | Freq: Two times a day (BID) | ORAL | Status: DC | PRN

## 2015-05-08 NOTE — Assessment & Plan Note (Signed)
Chronic  Xanax prn  Potential benefits of a long term benzodiazepines  use as well as potential risks  and complications were explained to the patient and were aknowledged. 

## 2015-05-08 NOTE — Progress Notes (Signed)
Pre visit review using our clinic review tool, if applicable. No additional management support is needed unless otherwise documented below in the visit note. 
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
## 2015-05-08 NOTE — Assessment & Plan Note (Signed)
Stress aggravated, chronic Fioricet prn  Potential benefits of a long term Fioricet  use as well as potential risks  and complications were explained to the patient and were aknowledged. Reboung HAs discussed.  

## 2015-05-08 NOTE — Assessment & Plan Note (Signed)
No relapse 

## 2015-06-15 ENCOUNTER — Other Ambulatory Visit: Payer: Self-pay | Admitting: Internal Medicine

## 2015-06-19 ENCOUNTER — Other Ambulatory Visit: Payer: Self-pay | Admitting: Internal Medicine

## 2015-06-23 ENCOUNTER — Other Ambulatory Visit: Payer: Self-pay | Admitting: Internal Medicine

## 2015-06-24 NOTE — Telephone Encounter (Signed)
Please advise, thanks.

## 2015-06-25 NOTE — Telephone Encounter (Signed)
rx called to pharm

## 2015-07-05 ENCOUNTER — Other Ambulatory Visit: Payer: Self-pay | Admitting: Internal Medicine

## 2015-07-08 NOTE — Telephone Encounter (Signed)
Called refill into pharmacy had to leave msg on pharmacy vm left md approval.../lmb

## 2015-07-18 ENCOUNTER — Other Ambulatory Visit: Payer: Self-pay | Admitting: Internal Medicine

## 2015-07-18 ENCOUNTER — Other Ambulatory Visit: Payer: Self-pay | Admitting: *Deleted

## 2015-07-18 MED ORDER — ALPRAZOLAM 0.5 MG PO TABS: 0.5000 mg | ORAL_TABLET | Freq: Two times a day (BID) | ORAL | Status: DC | PRN

## 2015-08-13 ENCOUNTER — Other Ambulatory Visit: Payer: Self-pay | Admitting: Internal Medicine

## 2015-08-15 ENCOUNTER — Other Ambulatory Visit: Payer: Self-pay | Admitting: Internal Medicine

## 2015-09-10 ENCOUNTER — Other Ambulatory Visit: Payer: Self-pay | Admitting: Internal Medicine

## 2015-09-11 ENCOUNTER — Other Ambulatory Visit: Payer: Self-pay | Admitting: Internal Medicine

## 2015-09-12 NOTE — Telephone Encounter (Signed)
Done

## 2015-09-18 ENCOUNTER — Telehealth: Payer: Self-pay | Admitting: Internal Medicine

## 2015-09-18 MED ORDER — BUTALBITAL-APAP-CAFFEINE 50-325-40 MG PO TABS: ORAL_TABLET | ORAL | Status: DC

## 2015-09-18 NOTE — Telephone Encounter (Signed)
[facility] on [address] called stating the instructions for butalbital-acetaminophen-caffeine (FIORICET, ESGIC) 50-325-40 MG tablet [id number] are wrong. Pt states she takes more than that and doctor is aware. Please call pharmacy

## 2015-09-18 NOTE — Telephone Encounter (Signed)
OK to fill this prescription with additional refills x0 Sch OV Thank you!

## 2015-09-18 NOTE — Telephone Encounter (Signed)
Called pharmacy spoke with Claiborne Billings inform her pt suppose to be taking 1 pill three times a day as needed. Claiborne Billings stated that the pt is going out of town and she was wanting to get early refill. Gave md authorization....Johny Chess

## 2015-09-24 ENCOUNTER — Other Ambulatory Visit: Payer: Self-pay | Admitting: Internal Medicine

## 2015-10-24 ENCOUNTER — Other Ambulatory Visit: Payer: Self-pay | Admitting: Internal Medicine

## 2015-10-24 NOTE — Telephone Encounter (Signed)
Needs OV.  

## 2015-10-25 NOTE — Telephone Encounter (Signed)
Xanax and neurotin rx called in to [facility]

## 2015-10-31 MED FILL — MORPHINE SULF ER 15 MG TAB: 15 | 30 days supply | Qty: 60 | Fill #0

## 2015-11-20 ENCOUNTER — Other Ambulatory Visit: Payer: Self-pay | Admitting: Internal Medicine

## 2015-11-20 NOTE — Telephone Encounter (Signed)
Please advise, thanks.

## 2015-11-21 ENCOUNTER — Other Ambulatory Visit: Payer: Self-pay | Admitting: Internal Medicine

## 2015-11-22 ENCOUNTER — Other Ambulatory Visit: Payer: Self-pay | Admitting: Internal Medicine

## 2015-11-22 NOTE — Telephone Encounter (Signed)
Please advise, thanks.

## 2015-11-22 NOTE — Telephone Encounter (Signed)
Received call pt states pharmacy said that md denied refills and she is wanting to know why. Inform pt meds was denied because she is overdue for appt. Made appt for [facility] 11/25/15 @ 11:00...Johny Chess

## 2015-11-25 ENCOUNTER — Ambulatory Visit (INDEPENDENT_AMBULATORY_CARE_PROVIDER_SITE_OTHER): Payer: BLUE CROSS/BLUE SHIELD | Admitting: Internal Medicine

## 2015-11-25 ENCOUNTER — Encounter: Payer: Self-pay | Admitting: Internal Medicine

## 2015-11-25 VITALS — BP 130/82 | HR 87 | Wt 141.0 lb

## 2015-11-25 DIAGNOSIS — R55 Syncope and collapse: Secondary | ICD-10-CM

## 2015-11-25 DIAGNOSIS — F411 Generalized anxiety disorder: Secondary | ICD-10-CM

## 2015-11-25 DIAGNOSIS — E538 Deficiency of other specified B group vitamins: Secondary | ICD-10-CM

## 2015-11-25 DIAGNOSIS — M545 Low back pain, unspecified: Secondary | ICD-10-CM

## 2015-11-25 MED ORDER — ALPRAZOLAM 0.5 MG PO TABS: 0.5000 mg | ORAL_TABLET | Freq: Two times a day (BID) | ORAL | Status: DC | PRN

## 2015-11-25 MED ORDER — ZOLPIDEM TARTRATE 10 MG PO TABS: 10.0000 mg | ORAL_TABLET | Freq: Every evening | ORAL | Status: DC | PRN

## 2015-11-25 MED ORDER — TOPIRAMATE 100 MG PO TABS: 100.0000 mg | ORAL_TABLET | Freq: Every day | ORAL | Status: DC

## 2015-11-25 MED ORDER — MELOXICAM 15 MG PO TABS
15.0000 mg | ORAL_TABLET | Freq: Every day | ORAL | Status: DC | PRN
Start: 2015-11-25 — End: 2016-03-03

## 2015-11-25 MED ORDER — OMEPRAZOLE 40 MG PO CPDR: 40.0000 mg | DELAYED_RELEASE_CAPSULE | Freq: Every day | ORAL | Status: DC

## 2015-11-25 MED ORDER — BUTALBITAL-APAP-CAFFEINE 50-325-40 MG PO TABS: ORAL_TABLET | ORAL | Status: DC

## 2015-11-25 MED ORDER — GABAPENTIN 300 MG PO CAPS: ORAL_CAPSULE | ORAL | Status: DC

## 2015-11-25 MED ORDER — CYANOCOBALAMIN 1000 MCG/ML IJ SOLN: 1000.0000 ug | INTRAMUSCULAR | Status: DC

## 2015-11-25 NOTE — Assessment & Plan Note (Signed)
On B12 shots 
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
## 2015-11-25 NOTE — Progress Notes (Signed)
Pre visit review using our clinic review tool, if applicable. No additional management support is needed unless otherwise documented below in the visit note. 

## 2015-11-25 NOTE — Assessment & Plan Note (Signed)
No relapse 

## 2015-11-25 NOTE — Assessment & Plan Note (Signed)
Xanax prn 

## 2015-11-25 NOTE — Assessment & Plan Note (Signed)
F/u w/Dr Phillips  

## 2015-11-27 ENCOUNTER — Encounter: Payer: Self-pay | Admitting: Internal Medicine

## 2015-11-28 MED FILL — MORPHINE SULF 30 MG TAB SA: 30 | 30 days supply | Qty: 60 | Fill #0

## 2015-12-27 MED FILL — MORPHINE SULF 30 MG TAB SA: 30 | 30 days supply | Qty: 90 | Fill #0

## 2016-01-13 ENCOUNTER — Other Ambulatory Visit: Payer: Self-pay | Admitting: Internal Medicine

## 2016-01-17 NOTE — Telephone Encounter (Signed)
Called refill into pharmacy had to leave on pharmacy vm.../lmb 

## 2016-01-27 MED FILL — MORPHINE SULF 30 MG TAB SA: 30 | 30 days supply | Qty: 90 | Fill #0

## 2016-02-09 ENCOUNTER — Other Ambulatory Visit: Payer: Self-pay | Admitting: Internal Medicine

## 2016-02-10 NOTE — Telephone Encounter (Signed)
OV q 3 mo 

## 2016-02-15 ENCOUNTER — Other Ambulatory Visit: Payer: Self-pay | Admitting: Internal Medicine

## 2016-02-17 NOTE — Telephone Encounter (Signed)
Done

## 2016-02-17 NOTE — Telephone Encounter (Signed)
Pt called to check up on this request. Please help °

## 2016-02-21 ENCOUNTER — Ambulatory Visit: Payer: BLUE CROSS/BLUE SHIELD | Admitting: Internal Medicine

## 2016-02-24 MED FILL — HYDROCODON-APAP 10-325: 10-325 | 30 days supply | Qty: 120 | Fill #0

## 2016-02-25 ENCOUNTER — Telehealth: Payer: Self-pay | Admitting: Internal Medicine

## 2016-02-25 ENCOUNTER — Ambulatory Visit: Payer: BLUE CROSS/BLUE SHIELD | Admitting: Internal Medicine

## 2016-02-25 MED ORDER — ZOLPIDEM TARTRATE 10 MG PO TABS: 10.0000 mg | ORAL_TABLET | Freq: Every evening | ORAL | Status: DC | PRN

## 2016-02-25 MED ORDER — MIRTAZAPINE 30 MG PO TABS: ORAL_TABLET | ORAL | Status: DC

## 2016-02-25 NOTE — Telephone Encounter (Signed)
OK to fill both Rx with additional refills x0 Thank you!

## 2016-02-25 NOTE — Telephone Encounter (Signed)
Pt is needs a small refill of the Ambien and the Mirtazapine until she can get to her appt next week on 5/23  [facility] on [address]

## 2016-02-25 NOTE — Telephone Encounter (Signed)
Done. See meds. Left detailed mess informing pt.

## 2016-03-03 ENCOUNTER — Encounter: Payer: Self-pay | Admitting: Internal Medicine

## 2016-03-03 ENCOUNTER — Ambulatory Visit (INDEPENDENT_AMBULATORY_CARE_PROVIDER_SITE_OTHER): Payer: BLUE CROSS/BLUE SHIELD | Admitting: Internal Medicine

## 2016-03-03 ENCOUNTER — Other Ambulatory Visit (INDEPENDENT_AMBULATORY_CARE_PROVIDER_SITE_OTHER): Payer: BLUE CROSS/BLUE SHIELD

## 2016-03-03 VITALS — BP 138/80 | HR 85 | Wt 139.0 lb

## 2016-03-03 DIAGNOSIS — M545 Low back pain, unspecified: Secondary | ICD-10-CM

## 2016-03-03 DIAGNOSIS — R609 Edema, unspecified: Secondary | ICD-10-CM | POA: Insufficient documentation

## 2016-03-03 DIAGNOSIS — E538 Deficiency of other specified B group vitamins: Secondary | ICD-10-CM | POA: Diagnosis not present

## 2016-03-03 DIAGNOSIS — G47419 Narcolepsy without cataplexy: Secondary | ICD-10-CM

## 2016-03-03 DIAGNOSIS — R600 Localized edema: Secondary | ICD-10-CM

## 2016-03-03 DIAGNOSIS — F411 Generalized anxiety disorder: Secondary | ICD-10-CM

## 2016-03-03 LAB — BASIC METABOLIC PANEL
BUN: 8 mg/dL (ref 6–23)
CO2: 23 mEq/L (ref 19–32)
Calcium: 9 mg/dL (ref 8.4–10.5)
Chloride: 112 mEq/L (ref 96–112)
Creatinine, Ser: 0.93 mg/dL (ref 0.40–1.20)
GFR: 69.23 mL/min (ref >60.00)
Glucose, Bld: 80 mg/dL (ref 70–99)
Potassium: 3.9 mEq/L (ref 3.5–5.1)
Sodium: 141 mEq/L (ref 135–145)

## 2016-03-03 LAB — URINALYSIS
Bilirubin Urine: NEGATIVE
Hgb urine dipstick: NEGATIVE
Ketones, ur: NEGATIVE
Leukocytes, UA: NEGATIVE
Nitrite: NEGATIVE
Specific Gravity, Urine: 1.015 (ref 1.000–1.030)
Total Protein, Urine: NEGATIVE
Urine Glucose: NEGATIVE
Urobilinogen, UA: 0.2 (ref 0.0–1.0)
pH: 6 (ref 5.0–8.0)

## 2016-03-03 LAB — CBC WITH DIFFERENTIAL/PLATELET
Basophils Absolute: 0 10*3/uL (ref 0.0–0.1)
Basophils Relative: 0.4 % (ref 0.0–3.0)
Eosinophils Absolute: 0 10*3/uL (ref 0.0–0.7)
Eosinophils Relative: 0 % (ref 0.0–5.0)
HCT: 35.6 % — ABNORMAL LOW (ref 36.0–46.0)
Hemoglobin: 11.8 g/dL — ABNORMAL LOW (ref 12.0–15.0)
Lymphocytes Relative: 37.9 % (ref 12.0–46.0)
Lymphs Abs: 3.8 10*3/uL (ref 0.7–4.0)
MCHC: 33.1 g/dL (ref 30.0–36.0)
MCV: 89.9 fl (ref 78.0–100.0)
Monocytes Absolute: 0.6 10*3/uL (ref 0.1–1.0)
Monocytes Relative: 6.1 % (ref 3.0–12.0)
Neutro Abs: 5.6 10*3/uL (ref 1.4–7.7)
Neutrophils Relative %: 55.6 % (ref 43.0–77.0)
Platelets: 418 10*3/uL — ABNORMAL HIGH (ref 150.0–400.0)
RBC: 3.96 Mil/uL (ref 3.87–5.11)
RDW: 15.9 % — ABNORMAL HIGH (ref 11.5–15.5)
WBC: 10.1 10*3/uL (ref 4.0–10.5)

## 2016-03-03 LAB — HEPATIC FUNCTION PANEL
ALT: 8 U/L (ref 0–35)
AST: 12 U/L (ref 0–37)
Albumin: 3.7 g/dL (ref 3.5–5.2)
Alkaline Phosphatase: 93 U/L (ref 39–117)
Bilirubin, Direct: 0 mg/dL (ref 0.0–0.3)
Total Bilirubin: 0.2 mg/dL (ref 0.2–1.2)
Total Protein: 6.9 g/dL (ref 6.0–8.3)

## 2016-03-03 LAB — TSH: TSH: 1.15 u[IU]/mL (ref 0.35–4.50)

## 2016-03-03 MED ORDER — OMEPRAZOLE 40 MG PO CPDR: 40.0000 mg | DELAYED_RELEASE_CAPSULE | Freq: Every day | ORAL | Status: DC

## 2016-03-03 MED ORDER — BUTALBITAL-APAP-CAFFEINE 50-325-40 MG PO TABS: 1.0000 | ORAL_TABLET | Freq: Three times a day (TID) | ORAL | Status: DC | PRN

## 2016-03-03 MED ORDER — MIRTAZAPINE 30 MG PO TABS: ORAL_TABLET | ORAL | Status: DC

## 2016-03-03 MED ORDER — ALPRAZOLAM 0.5 MG PO TABS: 0.5000 mg | ORAL_TABLET | Freq: Two times a day (BID) | ORAL | Status: DC | PRN

## 2016-03-03 MED ORDER — FUROSEMIDE 20 MG PO TABS: 20.0000 mg | ORAL_TABLET | Freq: Every day | ORAL | Status: DC

## 2016-03-03 MED ORDER — ZOLPIDEM TARTRATE 10 MG PO TABS: 10.0000 mg | ORAL_TABLET | Freq: Every evening | ORAL | Status: DC | PRN

## 2016-03-03 MED ORDER — NABUMETONE 500 MG PO TABS: 500.0000 mg | ORAL_TABLET | Freq: Two times a day (BID) | ORAL | Status: DC | PRN

## 2016-03-03 NOTE — Assessment & Plan Note (Signed)
No relapse 
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
## 2016-03-03 NOTE — Progress Notes (Signed)
Pre visit review using our clinic review tool, if applicable. No additional management support is needed unless otherwise documented below in the visit note. 

## 2016-03-03 NOTE — Assessment & Plan Note (Signed)
On B12 

## 2016-03-03 NOTE — Assessment & Plan Note (Signed)
Not taking Meloxicam did not seem to help Labs Trial of lasix prn

## 2016-03-03 NOTE — Assessment & Plan Note (Signed)
Xanax prn  Potential benefits of a long term benzodiazepines  use as well as potential risks  and complications were explained to the patient and were aknowledged. 

## 2016-03-03 NOTE — Assessment & Plan Note (Signed)
Dr Hardin Negus - pain clinic

## 2016-03-26 MED FILL — HYDROCODON-APAP 10-325: 10-325 | 30 days supply | Qty: 120 | Fill #0

## 2016-05-05 ENCOUNTER — Other Ambulatory Visit: Payer: Self-pay | Admitting: *Deleted

## 2016-05-05 MED ORDER — GABAPENTIN 300 MG PO CAPS: ORAL_CAPSULE | ORAL | 5 refills | Status: DC

## 2016-05-06 ENCOUNTER — Emergency Department (HOSPITAL_COMMUNITY)
Admission: EM | Admit: 2016-05-06 | Discharge: 2016-05-06 | Disposition: A | Discharge status: Home / Self Care | Payer: BLUE CROSS/BLUE SHIELD | Attending: Emergency Medicine | Admitting: Emergency Medicine

## 2016-05-06 ENCOUNTER — Encounter (HOSPITAL_COMMUNITY): Payer: Self-pay

## 2016-05-06 ENCOUNTER — Other Ambulatory Visit: Payer: Self-pay | Admitting: *Deleted

## 2016-05-06 DIAGNOSIS — Y92410 Unspecified street and highway as the place of occurrence of the external cause: Secondary | ICD-10-CM | POA: Insufficient documentation

## 2016-05-06 DIAGNOSIS — Y939 Activity, unspecified: Secondary | ICD-10-CM | POA: Insufficient documentation

## 2016-05-06 DIAGNOSIS — M546 Pain in thoracic spine: Secondary | ICD-10-CM | POA: Diagnosis present

## 2016-05-06 DIAGNOSIS — F17210 Nicotine dependence, cigarettes, uncomplicated: Secondary | ICD-10-CM | POA: Diagnosis not present

## 2016-05-06 DIAGNOSIS — Y999 Unspecified external cause status: Secondary | ICD-10-CM | POA: Diagnosis not present

## 2016-05-06 MED ORDER — VENLAFAXINE HCL ER 150 MG PO CP24: ORAL_CAPSULE | ORAL | 2 refills | Status: DC
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
## 2016-05-06 NOTE — Discharge Instructions (Signed)
Please follow-up with your orthopedic specialist for further evaluation. If he expands any new or worsening signs or symptoms please return to the emergency room.
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
## 2016-05-29 MED FILL — HYDROCODON-APAP 10-325: 10-325 | 30 days supply | Qty: 120 | Fill #0

## 2016-06-03 ENCOUNTER — Encounter: Payer: Self-pay | Admitting: Internal Medicine

## 2016-06-03 ENCOUNTER — Ambulatory Visit (INDEPENDENT_AMBULATORY_CARE_PROVIDER_SITE_OTHER): Payer: BLUE CROSS/BLUE SHIELD | Admitting: Internal Medicine

## 2016-06-03 DIAGNOSIS — F329 Major depressive disorder, single episode, unspecified: Secondary | ICD-10-CM | POA: Diagnosis not present

## 2016-06-03 DIAGNOSIS — G43819 Other migraine, intractable, without status migrainosus: Secondary | ICD-10-CM | POA: Diagnosis not present

## 2016-06-03 DIAGNOSIS — F411 Generalized anxiety disorder: Secondary | ICD-10-CM

## 2016-06-03 DIAGNOSIS — E538 Deficiency of other specified B group vitamins: Secondary | ICD-10-CM

## 2016-06-03 DIAGNOSIS — G4710 Hypersomnia, unspecified: Secondary | ICD-10-CM

## 2016-06-03 DIAGNOSIS — G4719 Other hypersomnia: Secondary | ICD-10-CM

## 2016-06-03 DIAGNOSIS — G43119 Migraine with aura, intractable, without status migrainosus: Secondary | ICD-10-CM

## 2016-06-03 DIAGNOSIS — Z23 Encounter for immunization: Secondary | ICD-10-CM

## 2016-06-03 DIAGNOSIS — F32A Depression, unspecified: Secondary | ICD-10-CM

## 2016-06-03 MED ORDER — CYCLOBENZAPRINE HCL 10 MG PO TABS: 5.0000 mg | ORAL_TABLET | Freq: Every evening | ORAL | 2 refills | Status: DC | PRN

## 2016-06-03 MED ORDER — NABUMETONE 500 MG PO TABS: 500.0000 mg | ORAL_TABLET | Freq: Two times a day (BID) | ORAL | 3 refills | Status: DC | PRN

## 2016-06-03 MED ORDER — TOPIRAMATE 100 MG PO TABS: 100.0000 mg | ORAL_TABLET | Freq: Every day | ORAL | 5 refills | Status: DC

## 2016-06-03 MED ORDER — ALPRAZOLAM 0.5 MG PO TABS: 0.5000 mg | ORAL_TABLET | Freq: Two times a day (BID) | ORAL | 3 refills | Status: DC | PRN

## 2016-06-03 MED ORDER — BUTALBITAL-APAP-CAFFEINE 50-325-40 MG PO TABS: 1.0000 | ORAL_TABLET | Freq: Three times a day (TID) | ORAL | 3 refills | Status: DC | PRN
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
## 2016-06-03 NOTE — Assessment & Plan Note (Signed)
Resolved

## 2016-06-03 NOTE — Assessment & Plan Note (Signed)
Fioricet prn  Potential benefits of a long term Fioricet  use as well as potential risks  and complications were explained to the patient and were aknowledged. Reboung HAs discussed.  
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
## 2016-06-03 NOTE — Assessment & Plan Note (Signed)
Xanax prn  Potential benefits of a long term benzodiazepines  use as well as potential risks  and complications were explained to the patient and were aknowledged. 

## 2016-06-03 NOTE — Assessment & Plan Note (Signed)
D/c Relafen  

## 2016-06-03 NOTE — Assessment & Plan Note (Signed)
Chronic stress On Remeron, Effexor 

## 2016-06-03 NOTE — Assessment & Plan Note (Signed)
On B12 sq

## 2016-06-03 NOTE — Progress Notes (Signed)
Pre visit review using our clinic review tool, if applicable. No additional management support is needed unless otherwise documented below in the visit note. 

## 2016-06-04 ENCOUNTER — Other Ambulatory Visit: Payer: Self-pay | Admitting: Internal Medicine

## 2016-06-06 ENCOUNTER — Other Ambulatory Visit: Payer: Self-pay | Admitting: Internal Medicine

## 2016-07-29 ENCOUNTER — Other Ambulatory Visit: Payer: Self-pay | Admitting: Internal Medicine

## 2016-07-30 NOTE — Telephone Encounter (Signed)
[place] controlled substance database checked - last filled 07/03/16.  Ok to fill medication.  rx printed.

## 2016-07-30 NOTE — Telephone Encounter (Signed)
Ok to rf in PCP's absence? Last OV was 06/03/16. Next OV is 09/07/16. Thanks!

## 2016-07-31 NOTE — Telephone Encounter (Signed)
Signed Rx faxed to pharmacy

## 2016-08-24 ENCOUNTER — Other Ambulatory Visit: Payer: Self-pay | Admitting: Internal Medicine

## 2016-08-25 ENCOUNTER — Other Ambulatory Visit: Payer: Self-pay | Admitting: Internal Medicine

## 2016-09-07 ENCOUNTER — Ambulatory Visit: Payer: BLUE CROSS/BLUE SHIELD | Admitting: Internal Medicine

## 2016-09-11 ENCOUNTER — Ambulatory Visit: Payer: BLUE CROSS/BLUE SHIELD | Admitting: Internal Medicine

## 2016-09-20 ENCOUNTER — Other Ambulatory Visit: Payer: Self-pay | Admitting: Internal Medicine

## 2016-10-19 ENCOUNTER — Other Ambulatory Visit: Payer: Self-pay | Admitting: Internal Medicine

## 2016-10-21 NOTE — Telephone Encounter (Signed)
sch OV 

## 2016-10-22 NOTE — Telephone Encounter (Signed)
Called refills into [facility] spoke w/[place] pharmacist gave MD approval.../lmb

## 2016-11-10 ENCOUNTER — Ambulatory Visit: Payer: BLUE CROSS/BLUE SHIELD | Admitting: Psychology

## 2017-01-26 ENCOUNTER — Other Ambulatory Visit: Payer: Self-pay | Admitting: Internal Medicine

## 2017-01-28 NOTE — Telephone Encounter (Signed)
I see you denied these medications yesteday, please see message below.

## 2017-01-28 NOTE — Telephone Encounter (Signed)
OK 2 months Thx

## 2017-01-28 NOTE — Telephone Encounter (Signed)
Pt called requesting refills on these medications (Gabapentin, Mirtazapine, Zolpidem Tartrate, Alprazolam and Butalbital-APAP-Caffeine). She said that she will have insurance in June and has scheduled an appointment for June 4th to see Dr Alain Marion. Can a two month supply be sent in to get her through until her appointment? Please advise.

## 2017-01-29 NOTE — Telephone Encounter (Signed)
RX loaded, please sign off. Thank you

## 2017-01-29 NOTE — Addendum Note (Signed)
Addended by: Karren Cobble on: 01/29/2017 08:55 AM   Modules accepted: Orders

## 2017-02-02 MED ORDER — ALPRAZOLAM 0.5 MG PO TABS: ORAL_TABLET | ORAL | 1 refills | Status: DC

## 2017-02-02 MED ORDER — GABAPENTIN 300 MG PO CAPS: ORAL_CAPSULE | ORAL | 1 refills | Status: DC

## 2017-02-02 MED ORDER — MIRTAZAPINE 30 MG PO TABS: ORAL_TABLET | ORAL | 1 refills | Status: DC

## 2017-02-02 MED ORDER — BUTALBITAL-APAP-CAFFEINE 50-325-40 MG PO TABS: ORAL_TABLET | ORAL | 1 refills | Status: DC

## 2017-02-02 NOTE — Telephone Encounter (Signed)
LM notifying pt, all RX sent or faxed

## 2017-02-02 NOTE — Telephone Encounter (Signed)
Ok to fill till her next appt in June Thx

## 2017-02-14 ENCOUNTER — Other Ambulatory Visit: Payer: Self-pay | Admitting: Internal Medicine

## 2017-03-15 ENCOUNTER — Ambulatory Visit: Payer: Self-pay | Admitting: Internal Medicine

## 2017-04-21 ENCOUNTER — Other Ambulatory Visit: Payer: Self-pay | Admitting: Internal Medicine

## 2017-05-07 ENCOUNTER — Other Ambulatory Visit: Payer: Self-pay

## 2017-05-07 MED ORDER — MIRTAZAPINE 30 MG PO TABS: ORAL_TABLET | ORAL | 1 refills | Status: DC

## 2017-05-07 MED ORDER — GABAPENTIN 300 MG PO CAPS: ORAL_CAPSULE | ORAL | 0 refills | Status: DC

## 2017-07-12 ENCOUNTER — Other Ambulatory Visit: Payer: Self-pay | Admitting: Internal Medicine

## 2017-07-16 ENCOUNTER — Telehealth: Payer: Self-pay | Admitting: Internal Medicine

## 2017-07-16 NOTE — Telephone Encounter (Signed)
Appointment scheduled with Dr Alain Marion on Aug 13, 2017. She asked to push it out to the first of November because she will have insurance at that time.

## 2017-07-16 NOTE — Telephone Encounter (Signed)
All script denied pt is way overdue for appt last saw MD 05/2016. Must have appt for refills especially for the controls due to changing is state law. Pls call pt to make appt...lmb

## 2017-07-16 NOTE — Telephone Encounter (Signed)
Pt called requesting the following refills: ALPRAZolam (XANAX) 0.5 MG tablet zolpidem (AMBIEN) 10 MG tablet mirtazapine (REMERON) 30 MG tablet butalbital-acetaminophen-caffeine (FIORICET, ESGIC) 50-325-40 MG tablet meloxicam (MOBIC) 15 MG tablet She would like them sent to [facility] on [address].

## 2017-07-19 NOTE — Telephone Encounter (Signed)
Pls advise if ok to refill meds below. Pt has made appt for 08/13/17...Vanessa Payne

## 2017-07-19 NOTE — Telephone Encounter (Signed)
Patient is calling to follow up, She also wanted to make sure we had the B12 on the list of refills. Please advise,.

## 2017-07-19 NOTE — Telephone Encounter (Signed)
Ok to refill each w/no refills Keep ROV Thx

## 2017-07-20 MED ORDER — ALPRAZOLAM 0.5 MG PO TABS: ORAL_TABLET | ORAL | 0 refills | Status: DC

## 2017-07-20 MED ORDER — MIRTAZAPINE 30 MG PO TABS: ORAL_TABLET | ORAL | 0 refills | Status: DC

## 2017-07-20 MED ORDER — MELOXICAM 15 MG PO TABS: 15.0000 mg | ORAL_TABLET | Freq: Every day | ORAL | 0 refills | Status: DC | PRN

## 2017-07-20 MED ORDER — ZOLPIDEM TARTRATE 10 MG PO TABS: 10.0000 mg | ORAL_TABLET | Freq: Every evening | ORAL | 0 refills | Status: DC | PRN

## 2017-07-20 MED ORDER — BUTALBITAL-APAP-CAFFEINE 50-325-40 MG PO TABS: ORAL_TABLET | ORAL | 0 refills | Status: DC

## 2017-07-20 NOTE — Addendum Note (Signed)
Addended by: Earnstine Regal on: 07/20/2017 09:39 AM   Modules accepted: Orders

## 2017-07-20 NOTE — Telephone Encounter (Signed)
Notified pt MD ok refills [facility]'s [facility].../lmb

## 2017-08-13 ENCOUNTER — Ambulatory Visit: Payer: Self-pay | Admitting: Internal Medicine

## 2017-08-17 ENCOUNTER — Other Ambulatory Visit: Payer: Self-pay | Admitting: Internal Medicine

## 2017-09-09 ENCOUNTER — Ambulatory Visit: Payer: Self-pay | Admitting: Internal Medicine

## 2017-09-20 ENCOUNTER — Other Ambulatory Visit: Payer: Self-pay | Admitting: Internal Medicine

## 2017-11-01 ENCOUNTER — Encounter: Payer: Self-pay | Admitting: Internal Medicine

## 2017-11-01 ENCOUNTER — Ambulatory Visit: Payer: BLUE CROSS/BLUE SHIELD | Admitting: Internal Medicine

## 2017-11-01 VITALS — BP 126/82 | HR 101 | Temp 98.0°F | Ht 65.0 in | Wt 152.0 lb

## 2017-11-01 DIAGNOSIS — G47429 Narcolepsy in conditions classified elsewhere without cataplexy: Secondary | ICD-10-CM | POA: Diagnosis not present

## 2017-11-01 DIAGNOSIS — M79604 Pain in right leg: Secondary | ICD-10-CM

## 2017-11-01 DIAGNOSIS — F411 Generalized anxiety disorder: Secondary | ICD-10-CM

## 2017-11-01 DIAGNOSIS — F17200 Nicotine dependence, unspecified, uncomplicated: Secondary | ICD-10-CM | POA: Diagnosis not present

## 2017-11-01 DIAGNOSIS — E538 Deficiency of other specified B group vitamins: Secondary | ICD-10-CM

## 2017-11-01 DIAGNOSIS — Z23 Encounter for immunization: Secondary | ICD-10-CM | POA: Diagnosis not present

## 2017-11-01 MED ORDER — VENLAFAXINE HCL ER 150 MG PO CP24: 150.0000 mg | ORAL_CAPSULE | Freq: Every day | ORAL | 5 refills | Status: DC

## 2017-11-01 MED ORDER — POTASSIUM CHLORIDE CRYS ER 20 MEQ PO TBCR: 20.0000 meq | EXTENDED_RELEASE_TABLET | Freq: Every day | ORAL | 5 refills | Status: DC

## 2017-11-01 MED ORDER — OMEPRAZOLE 40 MG PO CPDR: 40.0000 mg | DELAYED_RELEASE_CAPSULE | Freq: Every day | ORAL | 5 refills | Status: DC

## 2017-11-01 MED ORDER — MIRTAZAPINE 30 MG PO TABS: ORAL_TABLET | ORAL | 5 refills | Status: DC

## 2017-11-01 MED ORDER — GABAPENTIN 300 MG PO CAPS: ORAL_CAPSULE | ORAL | 5 refills | Status: DC

## 2017-11-01 MED ORDER — VARENICLINE TARTRATE 1 MG PO TABS: 1.0000 mg | ORAL_TABLET | Freq: Two times a day (BID) | ORAL | 4 refills | Status: DC

## 2017-11-01 MED ORDER — BUTALBITAL-APAP-CAFFEINE 50-325-40 MG PO TABS: ORAL_TABLET | ORAL | 5 refills | Status: DC

## 2017-11-01 MED ORDER — MELOXICAM 15 MG PO TABS: 15.0000 mg | ORAL_TABLET | Freq: Every day | ORAL | 5 refills | Status: DC | PRN

## 2017-11-01 MED ORDER — TOPIRAMATE 100 MG PO TABS: 100.0000 mg | ORAL_TABLET | Freq: Every day | ORAL | 5 refills | Status: DC

## 2017-11-01 MED ORDER — ZOLPIDEM TARTRATE 10 MG PO TABS: 10.0000 mg | ORAL_TABLET | Freq: Every evening | ORAL | 5 refills | Status: DC | PRN

## 2017-11-01 MED ORDER — ALPRAZOLAM 0.5 MG PO TABS: ORAL_TABLET | ORAL | 5 refills | Status: DC

## 2017-11-01 MED ORDER — VARENICLINE TARTRATE 0.5 MG X 11 & 1 MG X 42 PO MISC: ORAL | 0 refills | Status: DC

## 2017-11-01 MED ORDER — FUROSEMIDE 20 MG PO TABS: 20.0000 mg | ORAL_TABLET | Freq: Every day | ORAL | 5 refills | Status: DC

## 2017-11-01 MED ORDER — CYANOCOBALAMIN 1000 MCG/ML IJ SOLN: 1000.0000 ug | INTRAMUSCULAR | 11 refills | Status: DC

## 2017-11-01 MED ORDER — CYCLOBENZAPRINE HCL 10 MG PO TABS: ORAL_TABLET | ORAL | 5 refills | Status: DC

## 2017-11-01 NOTE — Assessment & Plan Note (Signed)
Re-start Rx 

## 2017-11-01 NOTE — Assessment & Plan Note (Signed)
Chronic  Xanax prn  Potential benefits of a long term benzodiazepines  use as well as potential risks  and complications were explained to the patient and were aknowledged. 

## 2017-11-01 NOTE — Progress Notes (Signed)
Subjective:  Patient ID: Vanessa Payne, female    DOB: [birth year]  Age: 47 y.o. MRN: [id number]  CC: No chief complaint on file.   HPI [name] presents for chronic pain, depression, anxiety C/o wt gain, apathy.  Outpatient Medications Prior to Visit  Medication Sig Dispense Refill  . Albuterol Sulfate (PROAIR RESPICLICK) 277 (90 BASE) MCG/ACT AEPB Inhale 1-2 puffs into the lungs 4 (four) times daily as needed. 1 each 11  . ALPRAZolam (XANAX) 0.5 MG tablet TAKE 1 TO 2 TABLETS BY MOUTH TWICE A DAY AS NEEDED FOR ANXIETY 60 tablet 0  . butalbital-acetaminophen-caffeine (FIORICET, ESGIC) 50-325-40 MG tablet TAKE 1 TABLET BY MOUTH 3 TIMES A DAY AS NEEDED FOR HEADACHE 90 tablet 0  . Cholecalciferol (VITAMIN D3) 5000 UNITS CAPS Take 1 capsule by mouth daily.    . cyanocobalamin (,VITAMIN B-12,) 1000 MCG/ML injection Inject 1 mL (1,000 mcg total) into the muscle every 21 ( twenty-one) days. 10 mL 11  . cyclobenzaprine (FLEXERIL) 10 MG tablet TAKE ONE-HALF TO ONE TABLET BY MOUTH AT BEDTIME AS NEEDED FOR MUSCLE SPASMS 30 tablet 2  . furosemide (LASIX) 20 MG tablet TAKE 1 TABLET BY MOUTH EVERY DAY 30 tablet 5  . gabapentin (NEURONTIN) 300 MG capsule TAKE 1 CAP BY MOUTH 5 TIMES DAILY AND 2 NIGHTLY AT BEDTIME. 210 capsule 0  . HYDROcodone-acetaminophen (NORCO) 10-325 MG tablet Take 1 tablet by mouth every 8 (eight) hours as needed.  0  . meloxicam (MOBIC) 15 MG tablet TAKE 1 TABLET DAILY AS NEEDED BY MOUTH FOR PAIN. MUST KEEP APPT ON 11/29 TO GET ADDITIONAL REFILLS! 30 tablet 0  . mirtazapine (REMERON) 30 MG tablet TAKE 1 TABLET BY MOUTH EVERY DAY AT 4-5 PM AS DIRECTED 30 tablet 0  . morphine (MS CONTIN) 15 MG 12 hr tablet Take 1 tablet by mouth 2 (two) times daily.  0  . omeprazole (PRILOSEC) 40 MG capsule Take 1 capsule (40 mg total) by mouth daily. Needs office visit for additional refills. 30 capsule 11  . potassium chloride SA (K-DUR,KLOR-CON) 20 MEQ tablet Take 1 tablet (20 mEq total)  by mouth daily. 30 tablet 3  . Tapentadol HCl (NUCYNTA) 75 MG TABS Take 1 tablet by mouth 4 (four) times daily. Reported on 03/03/2016    . topiramate (TOPAMAX) 100 MG tablet Take 1 tablet (100 mg total) by mouth daily. 30 tablet 5  . venlafaxine XR (EFFEXOR-XR) 150 MG 24 hr capsule Take 1 capsule (150 mg total) by mouth daily with breakfast. Must keep June appt for future refills 30 capsule 0  . zolpidem (AMBIEN) 10 MG tablet Take 1 tablet (10 mg total) by mouth at bedtime as needed. for sleep 30 tablet 0   No facility-administered medications prior to visit.     ROS Review of Systems  Constitutional: Positive for fatigue. Negative for activity change, appetite change, chills and unexpected weight change.  HENT: Negative for congestion, mouth sores and sinus pressure.   Eyes: Negative for visual disturbance.  Respiratory: Negative for cough and chest tightness.   Gastrointestinal: Negative for abdominal pain and nausea.  Genitourinary: Negative for difficulty urinating, frequency and vaginal pain.  Musculoskeletal: Positive for arthralgias, back pain, gait problem, myalgias and neck pain. Negative for joint swelling.  Skin: Negative for pallor and rash.  Neurological: Positive for headaches. Negative for dizziness, tremors, weakness and numbness.  Psychiatric/Behavioral: Positive for decreased concentration, dysphoric mood and sleep disturbance. Negative for confusion and suicidal ideas. The patient is  nervous/anxious.     Objective:  BP 126/82 (BP Location: Left Arm, Patient Position: Sitting, Cuff Size: Large)   Pulse (!) 101   Temp 98 F (36.7 C) (Oral)   Ht 5\' 5"  (1.651 m)   Wt 152 lb (68.9 kg)   SpO2 98%   BMI 25.29 kg/m   BP Readings from Last 3 Encounters:  11/01/17 126/82  06/03/16 120/80  05/06/16 146/94    Wt Readings from Last 3 Encounters:  11/01/17 152 lb (68.9 kg)  06/03/16 141 lb (64 kg)  05/06/16 125 lb (56.7 kg)    Physical Exam  Constitutional: She  appears well-developed. No distress.  HENT:  Head: Normocephalic.  Right Ear: External ear normal.  Left Ear: External ear normal.  Nose: Nose normal.  Mouth/Throat: Oropharynx is clear and moist.  Eyes: Conjunctivae are normal. Pupils are equal, round, and reactive to light. Right eye exhibits no discharge. Left eye exhibits no discharge.  Neck: Normal range of motion. Neck supple. No JVD present. No tracheal deviation present. No thyromegaly present.  Cardiovascular: Normal rate, regular rhythm and normal heart sounds.  Pulmonary/Chest: No stridor. No respiratory distress. She has no wheezes.  Abdominal: Soft. Bowel sounds are normal. She exhibits no distension and no mass. There is no tenderness. There is no rebound and no guarding.  Musculoskeletal: She exhibits tenderness. She exhibits no edema.  Lymphadenopathy:    She has no cervical adenopathy.  Neurological: She displays normal reflexes. No cranial nerve deficit. She exhibits normal muscle tone. Coordination abnormal.  Skin: No rash noted. No erythema.  Psychiatric: She has a normal mood and affect. Her behavior is normal. Judgment and thought content normal.    Lab Results  Component Value Date   WBC 10.1 03/03/2016   HGB 11.8 (L) 03/03/2016   HCT 35.6 (L) 03/03/2016   PLT 418.0 (H) 03/03/2016   GLUCOSE 80 03/03/2016   CHOL 203 (H) 01/27/2012   TRIG 183.0 (H) 01/27/2012   HDL 37.20 (L) 01/27/2012   LDLDIRECT 137.9 01/27/2012   ALT 8 03/03/2016   AST 12 03/03/2016   NA 141 03/03/2016   K 3.9 03/03/2016   CL 112 03/03/2016   CREATININE 0.93 03/03/2016   BUN 8 03/03/2016   CO2 23 03/03/2016   TSH 1.15 03/03/2016    No results found.  Assessment & Plan:   Diagnoses and all orders for this visit:  Need for influenza vaccination -     Flu Vaccine QUAD 6+ mos PF IM (Fluarix Quad PF)  Other orders -     ALPRAZolam (XANAX) 0.5 MG tablet; TAKE 1 TO 2 TABLETS BY MOUTH TWICE A DAY AS NEEDED FOR ANXIETY -      butalbital-acetaminophen-caffeine (FIORICET, ESGIC) 50-325-40 MG tablet; TAKE 1 TABLET BY MOUTH 3 TIMES A DAY AS NEEDED FOR HEADACHE -     cyanocobalamin (,VITAMIN B-12,) 1000 MCG/ML injection; Inject 1 mL (1,000 mcg total) into the muscle every 21 ( twenty-one) days. -     cyclobenzaprine (FLEXERIL) 10 MG tablet; TAKE ONE-HALF TO ONE TABLET BY MOUTH AT BEDTIME AS NEEDED FOR MUSCLE SPASMS -     furosemide (LASIX) 20 MG tablet; Take 1 tablet (20 mg total) by mouth daily. -     gabapentin (NEURONTIN) 300 MG capsule; TAKE 1 CAP BY MOUTH 5 TIMES DAILY AND 2 NIGHTLY AT BEDTIME. -     meloxicam (MOBIC) 15 MG tablet; Take 1 tablet (15 mg total) by mouth daily as needed for pain. -  mirtazapine (REMERON) 30 MG tablet; TAKE 1 TABLET BY MOUTH EVERY DAY AT 4-5 PM AS DIRECTED -     omeprazole (PRILOSEC) 40 MG capsule; Take 1 capsule (40 mg total) by mouth daily. -     potassium chloride SA (K-DUR,KLOR-CON) 20 MEQ tablet; Take 1 tablet (20 mEq total) by mouth daily. -     topiramate (TOPAMAX) 100 MG tablet; Take 1 tablet (100 mg total) by mouth daily. -     venlafaxine XR (EFFEXOR-XR) 150 MG 24 hr capsule; Take 1 capsule (150 mg total) by mouth daily with breakfast. -     zolpidem (AMBIEN) 10 MG tablet; Take 1 tablet (10 mg total) by mouth at bedtime as needed. for sleep   I am having Tyra L. Amaro maintain her Vitamin D3, potassium chloride SA, tapentadol HCl, Albuterol Sulfate, morphine, cyanocobalamin, omeprazole, HYDROcodone-acetaminophen, topiramate, furosemide, cyclobenzaprine, venlafaxine XR, gabapentin, ALPRAZolam, zolpidem, butalbital-acetaminophen-caffeine, mirtazapine, and meloxicam.  No orders of the defined types were placed in this encounter.    Follow-up: No Follow-up on file.  Walker Kehr, MD

## 2017-11-01 NOTE — Assessment & Plan Note (Signed)
Ref to Dr Hardin Negus

## 2017-11-01 NOTE — Assessment & Plan Note (Signed)
[facility]

## 2017-11-01 NOTE — Assessment & Plan Note (Signed)
No relapse 

## 2018-03-24 ENCOUNTER — Other Ambulatory Visit: Payer: Self-pay | Admitting: Internal Medicine

## 2018-03-29 ENCOUNTER — Ambulatory Visit: Payer: Self-pay | Admitting: Psychology

## 2018-04-20 ENCOUNTER — Other Ambulatory Visit: Payer: Self-pay | Admitting: Internal Medicine

## 2018-04-24 ENCOUNTER — Other Ambulatory Visit: Payer: Self-pay | Admitting: Internal Medicine

## 2018-04-25 ENCOUNTER — Other Ambulatory Visit: Payer: Self-pay | Admitting: Internal Medicine

## 2018-05-09 ENCOUNTER — Telehealth: Payer: Self-pay | Admitting: Internal Medicine

## 2018-05-09 NOTE — Telephone Encounter (Signed)
Copied from [place] [phone]. Topic: Quick Communication - See Telephone Encounter >> May 09, 2018 11:53 AM Mylinda Latina, NT wrote: CRM for notification. See Telephone encounter for: 05/09/18. Patient needs a refill of her butalbital-acetaminophen-caffeine (FIORICET, ESGIC) 50-325-40 MG tablet . She has an appt for a med refill appt on 05/23/18. She is wondering if Dr . Alain Marion can send in a refill of that medicine until her next a ppt   [facility] [place], [place] [phone] (Phone) [phone] (Fax)

## 2018-05-10 MED ORDER — BUTALBITAL-APAP-CAFFEINE 50-325-40 MG PO TABS: 1.0000 | ORAL_TABLET | Freq: Three times a day (TID) | ORAL | 0 refills | Status: DC | PRN

## 2018-05-10 NOTE — Telephone Encounter (Signed)
Will route to office for provider review  fiorcet refill Last Refill:03/29/18 Last OV: 11/01/17 PCP: Dr Alain Marion [facility] [address]

## 2018-05-10 NOTE — Addendum Note (Signed)
Addended by: Earnstine Regal on: 05/10/2018 01:56 PM   Modules accepted: Orders

## 2018-05-10 NOTE — Telephone Encounter (Signed)
Ok to ref Has to keep ROV appt Thx

## 2018-05-10 NOTE — Telephone Encounter (Signed)
MD ok'd refill..printed script for MD to sign, and faxed [facility] to [facility].Marland Kitchen/,b

## 2018-05-21 ENCOUNTER — Other Ambulatory Visit: Payer: Self-pay | Admitting: Internal Medicine

## 2018-05-23 ENCOUNTER — Encounter: Payer: Self-pay | Admitting: Internal Medicine

## 2018-05-23 ENCOUNTER — Ambulatory Visit (INDEPENDENT_AMBULATORY_CARE_PROVIDER_SITE_OTHER): Payer: BLUE CROSS/BLUE SHIELD | Admitting: Internal Medicine

## 2018-05-23 DIAGNOSIS — M79604 Pain in right leg: Secondary | ICD-10-CM

## 2018-05-23 DIAGNOSIS — G47429 Narcolepsy in conditions classified elsewhere without cataplexy: Secondary | ICD-10-CM

## 2018-05-23 DIAGNOSIS — E538 Deficiency of other specified B group vitamins: Secondary | ICD-10-CM | POA: Diagnosis not present

## 2018-05-23 MED ORDER — POTASSIUM CHLORIDE CRYS ER 20 MEQ PO TBCR: 20.0000 meq | EXTENDED_RELEASE_TABLET | Freq: Every day | ORAL | 5 refills | Status: DC

## 2018-05-23 MED ORDER — TOPIRAMATE 100 MG PO TABS: 100.0000 mg | ORAL_TABLET | Freq: Every day | ORAL | 5 refills | Status: DC

## 2018-05-23 MED ORDER — MIRTAZAPINE 30 MG PO TABS: ORAL_TABLET | ORAL | 5 refills | Status: DC

## 2018-05-23 MED ORDER — MELOXICAM 15 MG PO TABS: 15.0000 mg | ORAL_TABLET | Freq: Every day | ORAL | 5 refills | Status: DC | PRN

## 2018-05-23 MED ORDER — ZOLPIDEM TARTRATE 10 MG PO TABS: 10.0000 mg | ORAL_TABLET | Freq: Every evening | ORAL | 5 refills | Status: DC | PRN

## 2018-05-23 MED ORDER — VENLAFAXINE HCL ER 150 MG PO CP24: 150.0000 mg | ORAL_CAPSULE | Freq: Every day | ORAL | 5 refills | Status: DC

## 2018-05-23 MED ORDER — GABAPENTIN 300 MG PO CAPS: ORAL_CAPSULE | ORAL | 5 refills | Status: DC

## 2018-05-23 MED ORDER — CYCLOBENZAPRINE HCL 10 MG PO TABS
ORAL_TABLET | ORAL | 5 refills | Status: DC
Start: 2018-05-23 — End: 2018-10-27

## 2018-05-23 MED ORDER — BUTALBITAL-APAP-CAFFEINE 50-325-40 MG PO TABS: 1.0000 | ORAL_TABLET | Freq: Three times a day (TID) | ORAL | 1 refills | Status: DC | PRN

## 2018-05-23 MED ORDER — FUROSEMIDE 20 MG PO TABS: 20.0000 mg | ORAL_TABLET | Freq: Every day | ORAL | 5 refills | Status: DC

## 2018-05-23 MED ORDER — VARENICLINE TARTRATE 0.5 MG X 11 & 1 MG X 42 PO MISC: ORAL | 0 refills | Status: DC

## 2018-05-23 MED ORDER — VARENICLINE TARTRATE 1 MG PO TABS: 1.0000 mg | ORAL_TABLET | Freq: Two times a day (BID) | ORAL | 4 refills | Status: DC

## 2018-05-23 NOTE — Progress Notes (Signed)
Subjective:  Patient ID: Vanessa Payne, female    DOB: [birth year]  Age: 47 y.o. MRN: [id number]  CC: No chief complaint on file.   HPI ANNALYNN CENTANNI presents for chronic pain, depression, insomnia, HAs f/u.Loosing wt on [employer]  Outpatient Medications Prior to Visit  Medication Sig Dispense Refill  . Albuterol Sulfate (PROAIR RESPICLICK) 009 (90 BASE) MCG/ACT AEPB Inhale 1-2 puffs into the lungs 4 (four) times daily as needed. 1 each 11  . ALPRAZolam (XANAX) 0.5 MG tablet TAKE 1 TO 2 TABLETS BY MOUTH TWICE A DAY AS NEEDED FOR ANXIETY 60 tablet 5  . butalbital-acetaminophen-caffeine (FIORICET, ESGIC) 50-325-40 MG tablet Take 1 tablet by mouth 3 (three) times daily as needed for headache. 90 tablet 0  . Cholecalciferol (VITAMIN D3) 5000 UNITS CAPS Take 1 capsule by mouth daily.    . cyanocobalamin (,VITAMIN B-12,) 1000 MCG/ML injection Inject 1 mL (1,000 mcg total) into the muscle every 21 ( twenty-one) days. 10 mL 11  . cyclobenzaprine (FLEXERIL) 10 MG tablet TAKE ONE-HALF TO ONE TABLET BY MOUTH AT BEDTIME AS NEEDED FOR MUSCLE SPASMS 30 tablet 5  . furosemide (LASIX) 20 MG tablet TAKE 1 TABLET BY MOUTH EVERY DAY 30 tablet 5  . gabapentin (NEURONTIN) 300 MG capsule TAKE 1 CAPSULE BY MOUTH 5 TIMES DAILY AND 2 NIGHTLY AT BEDTIME. 630 capsule 1  . HYDROcodone-acetaminophen (NORCO) 10-325 MG tablet Take 1 tablet by mouth every 8 (eight) hours as needed.  0  . meloxicam (MOBIC) 15 MG tablet Take 1 tablet (15 mg total) by mouth daily as needed for pain. 30 tablet 5  . mirtazapine (REMERON) 30 MG tablet TAKE 1 TABLET BY MOUTH EVERY DAY AT 4-5 PM AS DIRECTED 90 tablet 2  . morphine (MS CONTIN) 15 MG 12 hr tablet Take 1 tablet by mouth 2 (two) times daily.  0  . omeprazole (PRILOSEC) 40 MG capsule Take 1 capsule (40 mg total) by mouth daily. 30 capsule 5  . potassium chloride SA (K-DUR,KLOR-CON) 20 MEQ tablet Take 1 tablet (20 mEq total) by mouth daily. 30 tablet 5  . topiramate (TOPAMAX) 100  MG tablet TAKE 1 TABLET BY MOUTH EVERY DAY 30 tablet 5  . varenicline (CHANTIX CONTINUING MONTH PAK) 1 MG tablet Take 1 tablet (1 mg total) by mouth 2 (two) times daily. 60 tablet 4  . varenicline (CHANTIX STARTING MONTH PAK) 0.5 MG X 11 & 1 MG X 42 tablet Take one 0.5 mg tablet by mouth once daily for 3 days, then increase to one 0.5 mg tablet twice daily for 4 days, then increase to one 1 mg tablet twice daily. 53 tablet 0  . venlafaxine XR (EFFEXOR-XR) 150 MG 24 hr capsule TAKE 1 CAPSULE (150 MG TOTAL) BY MOUTH DAILY WITH BREAKFAST. 30 capsule 5  . zolpidem (AMBIEN) 10 MG tablet Take 1 tablet (10 mg total) by mouth at bedtime as needed. for sleep 30 tablet 5  . Tapentadol HCl (NUCYNTA) 75 MG TABS Take 1 tablet by mouth 4 (four) times daily. Reported on 03/03/2016     No facility-administered medications prior to visit.     ROS: Review of Systems  Constitutional: Negative for activity change, appetite change, chills, fatigue and unexpected weight change.  HENT: Negative for congestion, mouth sores and sinus pressure.   Eyes: Negative for visual disturbance.  Respiratory: Negative for cough and chest tightness.   Gastrointestinal: Negative for abdominal pain and nausea.  Genitourinary: Negative for difficulty urinating, frequency and vaginal pain.  Musculoskeletal: Positive for arthralgias, back pain and gait problem.  Skin: Negative for pallor and rash.  Neurological: Negative for dizziness, tremors, weakness, numbness and headaches.  Psychiatric/Behavioral: Positive for dysphoric mood and sleep disturbance. Negative for confusion and suicidal ideas. The patient is nervous/anxious.     Objective:  BP 122/74 (BP Location: Left Arm, Patient Position: Sitting, Cuff Size: Normal)   Pulse 80   Temp 98.5 F (36.9 C) (Oral)   Ht 5\' 5"  (1.651 m)   Wt 130 lb (59 kg)   SpO2 98%   BMI 21.63 kg/m   BP Readings from Last 3 Encounters:  05/23/18 122/74  11/01/17 126/82  06/03/16 120/80     Wt Readings from Last 3 Encounters:  05/23/18 130 lb (59 kg)  11/01/17 152 lb (68.9 kg)  06/03/16 141 lb (64 kg)    Physical Exam  Constitutional: She appears well-developed. No distress.  HENT:  Head: Normocephalic.  Right Ear: External ear normal.  Left Ear: External ear normal.  Nose: Nose normal.  Mouth/Throat: Oropharynx is clear and moist.  Eyes: Pupils are equal, round, and reactive to light. Conjunctivae are normal. Right eye exhibits no discharge. Left eye exhibits no discharge.  Neck: Normal range of motion. Neck supple. No JVD present. No tracheal deviation present. No thyromegaly present.  Cardiovascular: Normal rate, regular rhythm and normal heart sounds.  Pulmonary/Chest: No stridor. No respiratory distress. She has no wheezes.  Abdominal: Soft. Bowel sounds are normal. She exhibits no distension and no mass. There is no tenderness. There is no rebound and no guarding.  Musculoskeletal: She exhibits tenderness. She exhibits no edema.  Lymphadenopathy:    She has no cervical adenopathy.  Neurological: She displays normal reflexes. No cranial nerve deficit. She exhibits normal muscle tone. Coordination normal.  Skin: No rash noted. No erythema.  Psychiatric: She has a normal mood and affect. Her behavior is normal. Judgment and thought content normal.  R leg is swollen at the ankle A/o/c    Lab Results  Component Value Date   WBC 10.1 03/03/2016   HGB 11.8 (L) 03/03/2016   HCT 35.6 (L) 03/03/2016   PLT 418.0 (H) 03/03/2016   GLUCOSE 80 03/03/2016   CHOL 203 (H) 01/27/2012   TRIG 183.0 (H) 01/27/2012   HDL 37.20 (L) 01/27/2012   LDLDIRECT 137.9 01/27/2012   ALT 8 03/03/2016   AST 12 03/03/2016   NA 141 03/03/2016   K 3.9 03/03/2016   CL 112 03/03/2016   CREATININE 0.93 03/03/2016   BUN 8 03/03/2016   CO2 23 03/03/2016   TSH 1.15 03/03/2016    No results found.  Assessment & Plan:   There are no diagnoses linked to this encounter.   No  orders of the defined types were placed in this encounter.    Follow-up: No follow-ups on file.  Walker Kehr, MD

## 2018-05-23 NOTE — Assessment & Plan Note (Signed)
Stress aggravated, chronic Fioricet prn  Potential benefits of a long term Fioricet  use as well as potential risks  and complications were explained to the patient and were aknowledged. Reboung HAs discussed.

## 2018-05-23 NOTE — Assessment & Plan Note (Signed)
On B12 

## 2018-05-23 NOTE — Assessment & Plan Note (Signed)
No relapse 

## 2018-05-23 NOTE — Assessment & Plan Note (Signed)
Stable

## 2018-06-09 ENCOUNTER — Other Ambulatory Visit: Payer: Self-pay | Admitting: Internal Medicine

## 2018-07-06 DIAGNOSIS — M25561 Pain in right knee: Secondary | ICD-10-CM | POA: Diagnosis not present

## 2018-07-06 DIAGNOSIS — M7542 Impingement syndrome of left shoulder: Secondary | ICD-10-CM | POA: Diagnosis not present

## 2018-08-08 ENCOUNTER — Other Ambulatory Visit: Payer: Self-pay | Admitting: Internal Medicine

## 2018-08-23 ENCOUNTER — Telehealth: Payer: Self-pay | Admitting: Internal Medicine

## 2018-08-23 NOTE — Telephone Encounter (Signed)
Copied from [place] [phone]. Topic: Quick Communication - See Telephone Encounter >> Aug 23, 2018  4:14 PM Rayann Heman wrote: CRM for notification. See Telephone encounter for: 08/23/18. Pt called and stated that she would like a note stating that she can take butalbital-acetaminophen-caffeine (FIORICET, ESGIC) 50-325-40 MG tablet [[id number]] and work at the same time. Please advise Cb#[phone]

## 2018-08-26 NOTE — Telephone Encounter (Signed)
LMTCB

## 2018-10-06 ENCOUNTER — Other Ambulatory Visit: Payer: Self-pay | Admitting: Internal Medicine

## 2018-10-27 ENCOUNTER — Ambulatory Visit: Payer: 59 | Admitting: Internal Medicine

## 2018-10-27 ENCOUNTER — Other Ambulatory Visit (INDEPENDENT_AMBULATORY_CARE_PROVIDER_SITE_OTHER): Payer: 59

## 2018-10-27 ENCOUNTER — Encounter: Payer: Self-pay | Admitting: Internal Medicine

## 2018-10-27 VITALS — BP 124/76 | HR 86 | Temp 98.0°F | Ht 65.0 in | Wt 129.0 lb

## 2018-10-27 DIAGNOSIS — Z0000 Encounter for general adult medical examination without abnormal findings: Secondary | ICD-10-CM

## 2018-10-27 DIAGNOSIS — G4710 Hypersomnia, unspecified: Secondary | ICD-10-CM

## 2018-10-27 DIAGNOSIS — M79604 Pain in right leg: Secondary | ICD-10-CM

## 2018-10-27 DIAGNOSIS — E538 Deficiency of other specified B group vitamins: Secondary | ICD-10-CM

## 2018-10-27 DIAGNOSIS — G4719 Other hypersomnia: Secondary | ICD-10-CM | POA: Diagnosis not present

## 2018-10-27 DIAGNOSIS — K029 Dental caries, unspecified: Secondary | ICD-10-CM

## 2018-10-27 DIAGNOSIS — Z23 Encounter for immunization: Secondary | ICD-10-CM

## 2018-10-27 DIAGNOSIS — F411 Generalized anxiety disorder: Secondary | ICD-10-CM

## 2018-10-27 DIAGNOSIS — M545 Low back pain: Secondary | ICD-10-CM

## 2018-10-27 DIAGNOSIS — G8929 Other chronic pain: Secondary | ICD-10-CM

## 2018-10-27 LAB — URINALYSIS, ROUTINE W REFLEX MICROSCOPIC
Bilirubin Urine: NEGATIVE
Hgb urine dipstick: NEGATIVE
Ketones, ur: NEGATIVE
Nitrite: NEGATIVE
RBC / HPF: NONE SEEN (ref 0–?)
Specific Gravity, Urine: <=1.005 — AB (ref 1.000–1.030)
Total Protein, Urine: NEGATIVE
Urine Glucose: NEGATIVE
Urobilinogen, UA: 0.2 (ref 0.0–1.0)
pH: 6 (ref 5.0–8.0)

## 2018-10-27 LAB — CBC WITH DIFFERENTIAL/PLATELET
Basophils Absolute: 0.1 10*3/uL (ref 0.0–0.1)
Basophils Relative: 1 % (ref 0.0–3.0)
Eosinophils Absolute: 0.1 10*3/uL (ref 0.0–0.7)
Eosinophils Relative: 1 % (ref 0.0–5.0)
HCT: 35.8 % — ABNORMAL LOW (ref 36.0–46.0)
Hemoglobin: 11.7 g/dL — ABNORMAL LOW (ref 12.0–15.0)
Lymphocytes Relative: 43.1 % (ref 12.0–46.0)
Lymphs Abs: 3.2 10*3/uL (ref 0.7–4.0)
MCHC: 32.6 g/dL (ref 30.0–36.0)
MCV: 91.9 fl (ref 78.0–100.0)
Monocytes Absolute: 0.7 10*3/uL (ref 0.1–1.0)
Monocytes Relative: 9.8 % (ref 3.0–12.0)
Neutro Abs: 3.3 10*3/uL (ref 1.4–7.7)
Neutrophils Relative %: 45.1 % (ref 43.0–77.0)
Platelets: 451 10*3/uL — ABNORMAL HIGH (ref 150.0–400.0)
RBC: 3.89 Mil/uL (ref 3.87–5.11)
RDW: 15.2 % (ref 11.5–15.5)
WBC: 7.3 10*3/uL (ref 4.0–10.5)

## 2018-10-27 LAB — BASIC METABOLIC PANEL
BUN: 8 mg/dL (ref 6–23)
CO2: 29 mEq/L (ref 19–32)
Calcium: 9.8 mg/dL (ref 8.4–10.5)
Chloride: 108 mEq/L (ref 96–112)
Creatinine, Ser: 0.98 mg/dL (ref 0.40–1.20)
GFR: 64.43 mL/min (ref >60.00)
Glucose, Bld: 74 mg/dL (ref 70–99)
Potassium: 4.8 mEq/L (ref 3.5–5.1)
Sodium: 142 mEq/L (ref 135–145)

## 2018-10-27 LAB — LIPID PANEL
Cholesterol: 215 mg/dL — ABNORMAL HIGH (ref 0–200)
HDL: 42.6 mg/dL (ref >39.00)
NonHDL: 172.66
Total CHOL/HDL Ratio: 5
Triglycerides: 289 mg/dL — ABNORMAL HIGH (ref 0.0–149.0)
VLDL: 57.8 mg/dL — ABNORMAL HIGH (ref 0.0–40.0)

## 2018-10-27 LAB — VITAMIN B12: Vitamin B-12: 537 pg/mL (ref 211–911)

## 2018-10-27 LAB — HEPATIC FUNCTION PANEL
ALT: 11 U/L (ref 0–35)
AST: 16 U/L (ref 0–37)
Albumin: 3.8 g/dL (ref 3.5–5.2)
Alkaline Phosphatase: 116 U/L (ref 39–117)
Bilirubin, Direct: 0 mg/dL (ref 0.0–0.3)
Total Bilirubin: 0.2 mg/dL (ref 0.2–1.2)
Total Protein: 7.3 g/dL (ref 6.0–8.3)

## 2018-10-27 LAB — TSH: TSH: 0.67 u[IU]/mL (ref 0.35–4.50)

## 2018-10-27 LAB — VITAMIN D 25 HYDROXY (VIT D DEFICIENCY, FRACTURES): VITD: 29.83 ng/mL — ABNORMAL LOW (ref 30.00–100.00)

## 2018-10-27 LAB — LDL CHOLESTEROL, DIRECT: Direct LDL: 135 mg/dL

## 2018-10-27 MED ORDER — ALPRAZOLAM 0.5 MG PO TABS: ORAL_TABLET | ORAL | 3 refills | Status: DC

## 2018-10-27 MED ORDER — MELOXICAM 15 MG PO TABS: 15.0000 mg | ORAL_TABLET | Freq: Every day | ORAL | 5 refills | Status: DC | PRN

## 2018-10-27 MED ORDER — VENLAFAXINE HCL ER 150 MG PO CP24: 150.0000 mg | ORAL_CAPSULE | Freq: Every day | ORAL | 5 refills | Status: DC

## 2018-10-27 MED ORDER — BUTALBITAL-APAP-CAFFEINE 50-325-40 MG PO TABS: ORAL_TABLET | ORAL | 0 refills | Status: DC

## 2018-10-27 MED ORDER — CYANOCOBALAMIN 1000 MCG/ML IJ SOLN: 1000.0000 ug | INTRAMUSCULAR | 11 refills | Status: DC

## 2018-10-27 MED ORDER — CYCLOBENZAPRINE HCL 10 MG PO TABS: ORAL_TABLET | ORAL | 5 refills | Status: DC

## 2018-10-27 MED ORDER — MIRTAZAPINE 30 MG PO TABS: ORAL_TABLET | ORAL | 5 refills | Status: DC

## 2018-10-27 MED ORDER — OMEPRAZOLE 40 MG PO CPDR: 40.0000 mg | DELAYED_RELEASE_CAPSULE | Freq: Every day | ORAL | 5 refills | Status: DC

## 2018-10-27 MED ORDER — TOPIRAMATE 100 MG PO TABS: 100.0000 mg | ORAL_TABLET | Freq: Every day | ORAL | 5 refills | Status: DC

## 2018-10-27 MED ORDER — ZOLPIDEM TARTRATE 10 MG PO TABS: 10.0000 mg | ORAL_TABLET | Freq: Every evening | ORAL | 5 refills | Status: DC | PRN

## 2018-10-27 MED ORDER — GABAPENTIN 300 MG PO CAPS: ORAL_CAPSULE | ORAL | 5 refills | Status: DC

## 2018-10-27 NOTE — Assessment & Plan Note (Signed)
Doing well 

## 2018-10-27 NOTE — Assessment & Plan Note (Signed)
Per Dr Hardin Negus

## 2018-10-27 NOTE — Assessment & Plan Note (Signed)
Fioricet prn  Potential benefits of a long term Fioricet  use as well as potential risks  and complications were explained to the patient and were aknowledged. Reboung HAs discussed.

## 2018-10-27 NOTE — Assessment & Plan Note (Signed)
Advised to make an appt w/[employer]

## 2018-10-27 NOTE — Assessment & Plan Note (Signed)
Xanax prn  Potential benefits of a long term benzodiazepines  use as well as potential risks  and complications were explained to the patient and were aknowledged. 

## 2018-10-27 NOTE — Assessment & Plan Note (Signed)
On B12 

## 2018-10-27 NOTE — Progress Notes (Signed)
Subjective:  Patient ID: Vanessa Payne, female    DOB: [birth year]  Age: 48 y.o. MRN: [id number]  CC: No chief complaint on file.   HPI EMMAGENE ORTNER presents for HAs, chronic pain, depression, insomnia Two jobs: 80 h/wk She is a Visual merchandiser - accounting  Outpatient Medications Prior to Visit  Medication Sig Dispense Refill  . Albuterol Sulfate (PROAIR RESPICLICK) 601 (90 BASE) MCG/ACT AEPB Inhale 1-2 puffs into the lungs 4 (four) times daily as needed. 1 each 11  . ALPRAZolam (XANAX) 0.5 MG tablet TAKE 1 TO 2 TABLETS BY MOUTH TWICE A DAY AS NEEDED FOR ANXIETY 60 tablet 0  . butalbital-acetaminophen-caffeine (FIORICET, ESGIC) 50-325-40 MG tablet Take 1 tablet by mouth 3 (three) times daily as needed for headache. 90 tablet 1  . butalbital-acetaminophen-caffeine (FIORICET, ESGIC) 50-325-40 MG tablet TAKE ONE TABLET BY MOUTH THREE TIMES DAILY AS NEEDED FOR HEADACHE 90 tablet 0  . Cholecalciferol (VITAMIN D3) 5000 UNITS CAPS Take 1 capsule by mouth daily.    . cyanocobalamin (,VITAMIN B-12,) 1000 MCG/ML injection Inject 1 mL (1,000 mcg total) into the muscle every 21 ( twenty-one) days. 10 mL 11  . cyclobenzaprine (FLEXERIL) 10 MG tablet TAKE ONE-HALF TO ONE TABLET BY MOUTH AT BEDTIME AS NEEDED FOR MUSCLE SPASMS 30 tablet 5  . furosemide (LASIX) 20 MG tablet Take 1 tablet (20 mg total) by mouth daily. 30 tablet 5  . gabapentin (NEURONTIN) 300 MG capsule 2 po tid prn 180 capsule 5  . HYDROcodone-acetaminophen (NORCO) 10-325 MG tablet Take 1 tablet by mouth every 8 (eight) hours as needed.  0  . meloxicam (MOBIC) 15 MG tablet Take 1 tablet (15 mg total) by mouth daily as needed for pain. 30 tablet 5  . mirtazapine (REMERON) 30 MG tablet 1 po qd at hs 30 tablet 5  . morphine (MS CONTIN) 15 MG 12 hr tablet Take 1 tablet by mouth 2 (two) times daily.  0  . omeprazole (PRILOSEC) 40 MG capsule Take 1 capsule (40 mg total) by mouth daily. 30 capsule 5  . potassium chloride SA (K-DUR,KLOR-CON)  20 MEQ tablet Take 1 tablet (20 mEq total) by mouth daily. 30 tablet 5  . topiramate (TOPAMAX) 100 MG tablet Take 1 tablet (100 mg total) by mouth daily. 30 tablet 5  . varenicline (CHANTIX CONTINUING MONTH PAK) 1 MG tablet Take 1 tablet (1 mg total) by mouth 2 (two) times daily. 60 tablet 4  . varenicline (CHANTIX STARTING MONTH PAK) 0.5 MG X 11 & 1 MG X 42 tablet Take one 0.5 mg tablet by mouth once daily for 3 days, then increase to one 0.5 mg tablet twice daily for 4 days, then increase to one 1 mg tablet twice daily. 53 tablet 0  . venlafaxine XR (EFFEXOR-XR) 150 MG 24 hr capsule Take 1 capsule (150 mg total) by mouth daily with breakfast. 30 capsule 5  . zolpidem (AMBIEN) 10 MG tablet Take 1 tablet (10 mg total) by mouth at bedtime as needed. for sleep 30 tablet 5   No facility-administered medications prior to visit.     ROS: Review of Systems  Constitutional: Negative for activity change, appetite change, chills, fatigue and unexpected weight change.  HENT: Negative for congestion, mouth sores and sinus pressure.   Eyes: Negative for visual disturbance.  Respiratory: Negative for cough and chest tightness.   Gastrointestinal: Negative for abdominal pain and nausea.  Genitourinary: Negative for difficulty urinating, frequency and vaginal pain.  Musculoskeletal: Positive for  arthralgias and gait problem. Negative for back pain.  Skin: Negative for pallor and rash.  Neurological: Negative for dizziness, tremors, weakness, numbness and headaches.  Psychiatric/Behavioral: Positive for sleep disturbance. Negative for confusion and suicidal ideas. The patient is nervous/anxious.     Objective:  BP 124/76 (BP Location: Left Arm, Patient Position: Sitting, Cuff Size: Normal)   Pulse 86   Temp 98 F (36.7 C) (Oral)   Ht 5\' 5"  (1.651 m)   Wt 129 lb (58.5 kg)   SpO2 97%   BMI 21.47 kg/m   BP Readings from Last 3 Encounters:  10/27/18 124/76  05/23/18 122/74  11/01/17 126/82    Wt  Readings from Last 3 Encounters:  10/27/18 129 lb (58.5 kg)  05/23/18 130 lb (59 kg)  11/01/17 152 lb (68.9 kg)    Physical Exam Constitutional:      General: She is not in acute distress.    Appearance: She is well-developed.  HENT:     Head: Normocephalic.     Right Ear: External ear normal.     Left Ear: External ear normal.     Nose: Nose normal.  Eyes:     General:        Right eye: No discharge.        Left eye: No discharge.     Conjunctiva/sclera: Conjunctivae normal.     Pupils: Pupils are equal, round, and reactive to light.  Neck:     Musculoskeletal: Normal range of motion and neck supple.     Thyroid: No thyromegaly.     Vascular: No JVD.     Trachea: No tracheal deviation.  Cardiovascular:     Rate and Rhythm: Normal rate and regular rhythm.     Heart sounds: Normal heart sounds.  Pulmonary:     Effort: No respiratory distress.     Breath sounds: No stridor. No wheezing.  Abdominal:     General: Bowel sounds are normal. There is no distension.     Palpations: Abdomen is soft. There is no mass.     Tenderness: There is no abdominal tenderness. There is no guarding or rebound.  Musculoskeletal:        General: Tenderness present.  Lymphadenopathy:     Cervical: No cervical adenopathy.  Skin:    Findings: No erythema or rash.  Neurological:     Cranial Nerves: No cranial nerve deficit.     Motor: No abnormal muscle tone.     Coordination: Coordination normal.     Gait: Gait abnormal.     Deep Tendon Reflexes: Reflexes normal.  Psychiatric:        Behavior: Behavior normal.        Thought Content: Thought content normal.        Judgment: Judgment normal.   caries Painful leg, LS spine  Lab Results  Component Value Date   WBC 10.1 03/03/2016   HGB 11.8 (L) 03/03/2016   HCT 35.6 (L) 03/03/2016   PLT 418.0 (H) 03/03/2016   GLUCOSE 80 03/03/2016   CHOL 203 (H) 01/27/2012   TRIG 183.0 (H) 01/27/2012   HDL 37.20 (L) 01/27/2012   LDLDIRECT 137.9  01/27/2012   ALT 8 03/03/2016   AST 12 03/03/2016   NA 141 03/03/2016   K 3.9 03/03/2016   CL 112 03/03/2016   CREATININE 0.93 03/03/2016   BUN 8 03/03/2016   CO2 23 03/03/2016   TSH 1.15 03/03/2016    No results found.  Assessment & Plan:  There are no diagnoses linked to this encounter.   No orders of the defined types were placed in this encounter.    Follow-up: No follow-ups on file.  Walker Kehr, MD

## 2018-11-07 ENCOUNTER — Telehealth: Payer: Self-pay | Admitting: Internal Medicine

## 2018-11-07 NOTE — Telephone Encounter (Signed)
Copied from [place] [phone]. Topic: Quick Communication - See Telephone Encounter >> Nov 07, 2018  1:30 PM Blase Mess A wrote: CRM for notification. See Telephone encounter for: 11/07/18. Patient is calling back for lab results please advise thank you

## 2018-11-07 NOTE — Telephone Encounter (Signed)
Returned call to pt but no answer at this time. Left message for pt to return call to office for results.

## 2018-11-21 ENCOUNTER — Encounter: Payer: Self-pay | Admitting: Internal Medicine

## 2018-11-24 NOTE — Telephone Encounter (Signed)
Pt returning call and lab results given from 11/01/18 Notes recorded by Plotnikov, Evie Lacks, MD on 11/01/2018 at 7:43 AM EST In[place], Please inform the patient that all labs are stable, except for a very slight anemia. She has elevated lipids. Her vitamin D is low and she needs to double up on her vitamin D intake. Thyroid test is normal. Liver tests are normal. Thanks, AP  Pt would like to know if Dr. Alain Marion has any recommendations to increase Hbg level due to results stating that she was slightly anemic.

## 2018-11-24 NOTE — Telephone Encounter (Signed)
Please advise about anemia

## 2018-11-25 NOTE — Telephone Encounter (Signed)
Eat iron rich foods.  Take vitamins.  Please see GYN if irregular periods Thx

## 2018-11-25 NOTE — Telephone Encounter (Signed)
LM notifying pt

## 2018-12-05 ENCOUNTER — Other Ambulatory Visit: Payer: Self-pay | Admitting: Internal Medicine

## 2018-12-27 DIAGNOSIS — M25561 Pain in right knee: Secondary | ICD-10-CM | POA: Diagnosis not present

## 2018-12-27 DIAGNOSIS — M545 Low back pain: Secondary | ICD-10-CM | POA: Diagnosis not present

## 2018-12-27 DIAGNOSIS — M25361 Other instability, right knee: Secondary | ICD-10-CM | POA: Diagnosis not present

## 2019-01-04 ENCOUNTER — Other Ambulatory Visit: Payer: Self-pay | Admitting: Internal Medicine

## 2019-01-25 ENCOUNTER — Ambulatory Visit: Payer: Self-pay | Admitting: *Deleted

## 2019-01-25 NOTE — Telephone Encounter (Signed)
Virtual visit has been made for 910 with Dr.Plotnikov on 4/16

## 2019-01-25 NOTE — Telephone Encounter (Signed)
Patient was notified she has been in close contact with positive COVID employee at work. Patient was last exposed Thursday of last week. Patient states she was asymptomatic until she developed sore throat today. Patient is very concerned because her husband is stage 4 cancer patient.  Discussed COVID-19 protocols and will send note to provider for review and virtual visit.  Reason for Disposition . [1] Mild body aches, chills, diarrhea, headache, runny nose, or sore throat AND [2] within 14 days of COVID-Exposure  Answer Assessment - Initial Assessment Questions 1. CLOSE CONTACT: "Who is the person with the confirmed or suspected COVID-19 infection that you were exposed to?"     coworker 2. PLACE of CONTACT: "Where were you when you were exposed to COVID-19?" (e.g., home, school, medical waiting room; which city?)     At work 3. TYPE of CONTACT: "How much contact was there?" (e.g., sitting next to, live in same house, work in same office, same building)     Close contact- direct contact at work- HR was not specific 4. DURATION of CONTACT: "How long were you in contact with the COVID-19 patient?" (e.g., a few seconds, passed by person, a few minutes, live with the patient)     10 hours/day- length of contact unknown 5. DATE of CONTACT: "When did you have contact with a COVID-19 patient?" (e.g., how many days ago)     4/49/20 6. TRAVEL: "Have you traveled out of the country recently?" If so, "When and where?"     * Also ask about out-of-state travel, since the CDC has identified some high risk cities for community spread in the [place].     * Note: Travel becomes less relevant if there is widespread community transmission where the patient lives.     No travel 7. COMMUNITY SPREAD: "Are there lots of cases or COVID-19 (community spread) where you live?" (See public health department website, if unsure)   * MAJOR community spread: high number of cases; numbers of cases are increasing; many people  hospitalized.   * MINOR community spread: low number of cases; not increasing; few or no people hospitalized     Community spread 8. SYMPTOMS: "Do you have any symptoms?" (e.g., fever, cough, breathing difficulty)     Throat slightly sore- patient smokes- patient has had constant screening at both jobs she works 13. PREGNANCY OR POSTPARTUM: "Is there any chance you are pregnant?" "When was your last menstrual period?" "Did you deliver in the last 2 weeks?"     No- no cycles 10. HIGH RISK: "Do you have any heart or lung problems? Do you have a weak immune system?" (e.g., CHF, COPD, asthma, HIV positive, chemotherapy, renal failure, diabetes mellitus, sickle cell anemia)       no  Protocols used: CORONAVIRUS (COVID-19) EXPOSURE-A-AH

## 2019-01-26 ENCOUNTER — Encounter: Payer: Self-pay | Admitting: Internal Medicine

## 2019-01-26 ENCOUNTER — Ambulatory Visit (INDEPENDENT_AMBULATORY_CARE_PROVIDER_SITE_OTHER): Payer: 59 | Admitting: Internal Medicine

## 2019-01-26 DIAGNOSIS — Z20822 Contact with and (suspected) exposure to covid-19: Secondary | ICD-10-CM

## 2019-01-26 DIAGNOSIS — Z20828 Contact with and (suspected) exposure to other viral communicable diseases: Secondary | ICD-10-CM | POA: Diagnosis not present

## 2019-01-26 NOTE — Assessment & Plan Note (Signed)
Direct exposure to COVID 19 with the last direct contact on Thursday last week.  She is quarantined at home for 2 weeks.  She is asymptomatic.  I told Vanessa Payne not to go to work her second job this weekend.  She will call me and inform immediately if fever, cough, shortness of breath.  Rest, good hydration, vitamin C.

## 2019-01-26 NOTE — Progress Notes (Signed)
Virtual Visit via Telephone Note  I connected with Gaylene Brooks on 01/26/19 at  9:10 AM EDT by telephone and verified that I am speaking with the correct person using two identifiers.   I discussed the limitations, risks, security and privacy concerns of performing an evaluation and management service by telephone and the availability of in person appointments. I also discussed with the patient that there may be a patient responsible charge related to this service. The patient expressed understanding and agreed to proceed.   History of Present Illness:   Sherrin was exposed to a coworker with COVID19 infection-last Thursday was the last day.  She was sent home for 2 weeks.  She has another job that she supposed to work this weekend.  She is asking me whether she can go to work.  She denies cold-like symptoms, fever, cough, shortness of breath.  She had some hemorrhoid flareup from a mild diarrhea. Observations/Objective:  Mallery is in no acute distress.  She looks well Assessment and Plan: Direct exposure to COVID 19 with the last direct contact on Thursday last week.  She is quarantined at home for 2 weeks.  She is asymptomatic.  I told Aylissa not to go to work her second job this weekend.  She will call me and inform immediately if fever, cough, shortness of breath.  Rest, good hydration, vitamin C.  Follow Up Instructions:    I discussed the assessment and treatment plan with the patient. The patient was provided an opportunity to ask questions and all were answered. The patient agreed with the plan and demonstrated an understanding of the instructions.   The patient was advised to call back or seek an in-person evaluation if the symptoms worsen or if the condition fails to improve as anticipated.  I provided 15 minutes of non-face-to-face time during this encounter.   Walker Kehr, MD

## 2019-02-03 ENCOUNTER — Other Ambulatory Visit: Payer: Self-pay | Admitting: Internal Medicine

## 2019-02-08 ENCOUNTER — Encounter: Payer: Self-pay | Admitting: Internal Medicine

## 2019-02-08 ENCOUNTER — Ambulatory Visit (INDEPENDENT_AMBULATORY_CARE_PROVIDER_SITE_OTHER): Payer: 59 | Admitting: Internal Medicine

## 2019-02-08 ENCOUNTER — Ambulatory Visit: Payer: Self-pay

## 2019-02-08 DIAGNOSIS — Z20828 Contact with and (suspected) exposure to other viral communicable diseases: Secondary | ICD-10-CM

## 2019-02-08 DIAGNOSIS — G43109 Migraine with aura, not intractable, without status migrainosus: Secondary | ICD-10-CM | POA: Diagnosis not present

## 2019-02-08 DIAGNOSIS — Z20822 Contact with and (suspected) exposure to covid-19: Secondary | ICD-10-CM

## 2019-02-08 NOTE — Assessment & Plan Note (Signed)
Continue current therapy.  PRN Fioricet

## 2019-02-08 NOTE — Telephone Encounter (Signed)
Pt called stating that she has quarentined for 2 weeks after exposure to COVID-19 patient.  She needs a note to return to work. Call transferred to office for appointment per Sam.  Reason for Disposition . [1] Caller requesting NON-URGENT health information AND [2] PCP's office is the best resource  Answer Assessment - Initial Assessment Questions 1. REASON FOR CALL or QUESTION: "What is your reason for calling today?" or "How can I best help you?" or "What question do you have that I can help answer?"     I need a return to work note from Dr Alain Marion.  Protocols used: INFORMATION ONLY CALL-A-AH

## 2019-02-08 NOTE — Telephone Encounter (Signed)
Pt has virtual visit this afternoon

## 2019-02-08 NOTE — Progress Notes (Signed)
Virtual Visit via Video Note  I connected with Vanessa Payne on 02/08/19 at  1:20 PM EDT by a video enabled telemedicine application and verified that I am speaking with the correct person using two identifiers.   I discussed the limitations of evaluation and management by telemedicine and the availability of in person appointments. The patient expressed understanding and agreed to proceed.  History of Present Illness:   Vanessa Payne is following up on her COVID-19 exposure.  She has self quarantined for 2 weeks - she is ready to go back to work on May 2.  She needs a letter.  She has no fever, chills, cough, muscle aches, diarrhea or any other symptoms (outside her chronic complaints). Observations/Objective:  She looks well.  She is in no acute distress Assessment and Plan:  See plan Follow Up Instructions:    I discussed the assessment and treatment plan with the patient. The patient was provided an opportunity to ask questions and all were answered. The patient agreed with the plan and demonstrated an understanding of the instructions.   The patient was advised to call back or seek an in-person evaluation if the symptoms worsen or if the condition fails to improve as anticipated.  I provided 15 minutes of non-face-to-face time during this encounter.   Walker Kehr, MD

## 2019-02-08 NOTE — Assessment & Plan Note (Signed)
Okay to go back to work on May 2 without restrictions.  Will provide her with a letter.

## 2019-03-07 ENCOUNTER — Other Ambulatory Visit (INDEPENDENT_AMBULATORY_CARE_PROVIDER_SITE_OTHER): Payer: 59

## 2019-03-07 ENCOUNTER — Ambulatory Visit (INDEPENDENT_AMBULATORY_CARE_PROVIDER_SITE_OTHER): Payer: 59 | Admitting: Internal Medicine

## 2019-03-07 ENCOUNTER — Encounter: Payer: Self-pay | Admitting: Internal Medicine

## 2019-03-07 ENCOUNTER — Other Ambulatory Visit: Payer: Self-pay

## 2019-03-07 VITALS — BP 118/74 | HR 108 | Temp 98.3°F | Ht 65.0 in | Wt 124.0 lb

## 2019-03-07 DIAGNOSIS — M79604 Pain in right leg: Secondary | ICD-10-CM

## 2019-03-07 DIAGNOSIS — K029 Dental caries, unspecified: Secondary | ICD-10-CM | POA: Diagnosis not present

## 2019-03-07 DIAGNOSIS — Z0000 Encounter for general adult medical examination without abnormal findings: Secondary | ICD-10-CM

## 2019-03-07 DIAGNOSIS — G43109 Migraine with aura, not intractable, without status migrainosus: Secondary | ICD-10-CM | POA: Diagnosis not present

## 2019-03-07 DIAGNOSIS — F411 Generalized anxiety disorder: Secondary | ICD-10-CM

## 2019-03-07 DIAGNOSIS — E538 Deficiency of other specified B group vitamins: Secondary | ICD-10-CM

## 2019-03-07 DIAGNOSIS — G47429 Narcolepsy in conditions classified elsewhere without cataplexy: Secondary | ICD-10-CM

## 2019-03-07 LAB — CBC WITH DIFFERENTIAL/PLATELET
Basophils Absolute: 0.1 10*3/uL (ref 0.0–0.1)
Basophils Relative: 0.8 % (ref 0.0–3.0)
Eosinophils Absolute: 0 10*3/uL (ref 0.0–0.7)
Eosinophils Relative: 0.1 % (ref 0.0–5.0)
HCT: 37.5 % (ref 36.0–46.0)
Hemoglobin: 12.5 g/dL (ref 12.0–15.0)
Lymphocytes Relative: 29.9 % (ref 12.0–46.0)
Lymphs Abs: 3.1 10*3/uL (ref 0.7–4.0)
MCHC: 33.4 g/dL (ref 30.0–36.0)
MCV: 92.8 fl (ref 78.0–100.0)
Monocytes Absolute: 0.7 10*3/uL (ref 0.1–1.0)
Monocytes Relative: 7.1 % (ref 3.0–12.0)
Neutro Abs: 6.5 10*3/uL (ref 1.4–7.7)
Neutrophils Relative %: 62.1 % (ref 43.0–77.0)
Platelets: 403 10*3/uL — ABNORMAL HIGH (ref 150.0–400.0)
RBC: 4.04 Mil/uL (ref 3.87–5.11)
RDW: 16.8 % — ABNORMAL HIGH (ref 11.5–15.5)
WBC: 10.4 10*3/uL (ref 4.0–10.5)

## 2019-03-07 LAB — BASIC METABOLIC PANEL
BUN: 8 mg/dL (ref 6–23)
CO2: 22 mEq/L (ref 19–32)
Calcium: 9.5 mg/dL (ref 8.4–10.5)
Chloride: 109 mEq/L (ref 96–112)
Creatinine, Ser: 1 mg/dL (ref 0.40–1.20)
GFR: 59.13 mL/min — ABNORMAL LOW (ref >60.00)
Glucose, Bld: 68 mg/dL — ABNORMAL LOW (ref 70–99)
Potassium: 3.9 mEq/L (ref 3.5–5.1)
Sodium: 141 mEq/L (ref 135–145)

## 2019-03-07 LAB — URINALYSIS, ROUTINE W REFLEX MICROSCOPIC
Bilirubin Urine: NEGATIVE
Hgb urine dipstick: NEGATIVE
Ketones, ur: NEGATIVE
Nitrite: NEGATIVE
Specific Gravity, Urine: 1.025 (ref 1.000–1.030)
Total Protein, Urine: NEGATIVE
Urine Glucose: NEGATIVE
Urobilinogen, UA: 0.2 (ref 0.0–1.0)
pH: 6 (ref 5.0–8.0)

## 2019-03-07 LAB — LIPID PANEL
Cholesterol: 204 mg/dL — ABNORMAL HIGH (ref 0–200)
HDL: 43.1 mg/dL (ref >39.00)
LDL Cholesterol: 126 mg/dL — ABNORMAL HIGH (ref 0–99)
NonHDL: 160.61
Total CHOL/HDL Ratio: 5
Triglycerides: 175 mg/dL — ABNORMAL HIGH (ref 0.0–149.0)
VLDL: 35 mg/dL (ref 0.0–40.0)

## 2019-03-07 LAB — HEPATIC FUNCTION PANEL
ALT: 13 U/L (ref 0–35)
AST: 15 U/L (ref 0–37)
Albumin: 4 g/dL (ref 3.5–5.2)
Alkaline Phosphatase: 147 U/L — ABNORMAL HIGH (ref 39–117)
Bilirubin, Direct: 0 mg/dL (ref 0.0–0.3)
Total Bilirubin: 0.2 mg/dL (ref 0.2–1.2)
Total Protein: 7.7 g/dL (ref 6.0–8.3)

## 2019-03-07 LAB — TSH: TSH: 1.41 u[IU]/mL (ref 0.35–4.50)

## 2019-03-07 MED ORDER — OMEPRAZOLE 40 MG PO CPDR: 40.0000 mg | DELAYED_RELEASE_CAPSULE | Freq: Every day | ORAL | 5 refills | Status: DC

## 2019-03-07 MED ORDER — GABAPENTIN 300 MG PO CAPS: ORAL_CAPSULE | ORAL | 5 refills | Status: DC

## 2019-03-07 MED ORDER — FUROSEMIDE 20 MG PO TABS: 20.0000 mg | ORAL_TABLET | Freq: Every day | ORAL | 5 refills | Status: DC

## 2019-03-07 MED ORDER — ZOLPIDEM TARTRATE 10 MG PO TABS: 10.0000 mg | ORAL_TABLET | Freq: Every evening | ORAL | 5 refills | Status: DC | PRN

## 2019-03-07 MED ORDER — MELOXICAM 15 MG PO TABS: 15.0000 mg | ORAL_TABLET | Freq: Every day | ORAL | 5 refills | Status: DC | PRN

## 2019-03-07 MED ORDER — MIRTAZAPINE 30 MG PO TABS: ORAL_TABLET | ORAL | 5 refills | Status: DC

## 2019-03-07 MED ORDER — CYCLOBENZAPRINE HCL 10 MG PO TABS: ORAL_TABLET | ORAL | 5 refills | Status: DC

## 2019-03-07 MED ORDER — VENLAFAXINE HCL ER 150 MG PO CP24: 150.0000 mg | ORAL_CAPSULE | Freq: Every day | ORAL | 5 refills | Status: DC

## 2019-03-07 MED ORDER — ALPRAZOLAM 0.5 MG PO TABS: ORAL_TABLET | ORAL | 5 refills | Status: DC

## 2019-03-07 MED ORDER — BUTALBITAL-APAP-CAFFEINE 50-325-40 MG PO TABS: 1.0000 | ORAL_TABLET | Freq: Three times a day (TID) | ORAL | 5 refills | Status: DC | PRN

## 2019-03-07 MED ORDER — TOPIRAMATE 100 MG PO TABS: 100.0000 mg | ORAL_TABLET | Freq: Every day | ORAL | 5 refills | Status: DC

## 2019-03-07 MED ORDER — CYANOCOBALAMIN 1000 MCG/ML IJ SOLN: 1000.0000 ug | INTRAMUSCULAR | 11 refills | Status: DC

## 2019-03-07 NOTE — Progress Notes (Signed)
Subjective:  Patient ID: Vanessa Payne, female    DOB: [birth year]  Age: 48 y.o. MRN: [id number]  CC: No chief complaint on file.   HPI RAESHAWN TAFOLLA presents for a well exam F/u HAs, pain, anxiety Has to take Fioricet 3/day Working 2 jobs 29 h/wk  Both are physical    Outpatient Medications Prior to Visit  Medication Sig Dispense Refill  . Albuterol Sulfate (PROAIR RESPICLICK) 314 (90 BASE) MCG/ACT AEPB Inhale 1-2 puffs into the lungs 4 (four) times daily as needed. 1 each 11  . ALPRAZolam (XANAX) 0.5 MG tablet TAKE 1 TO 2 TABLETS BY MOUTH TWICE A DAY AS NEEDED FOR ANXIETY 60 tablet 3  . butalbital-acetaminophen-caffeine (FIORICET) 50-325-40 MG tablet TAKE ONE TABLET BY MOUTH THREE TIMES DAILY AS NEEDED FOR HEADACHE 90 tablet 0  . Cholecalciferol (VITAMIN D3) 5000 UNITS CAPS Take 1 capsule by mouth daily.    . cyanocobalamin (,VITAMIN B-12,) 1000 MCG/ML injection Inject 1 mL (1,000 mcg total) into the muscle every 21 ( twenty-one) days. 10 mL 11  . cyclobenzaprine (FLEXERIL) 10 MG tablet TAKE ONE-HALF TO ONE TABLET BY MOUTH AT BEDTIME AS NEEDED FOR MUSCLE SPASMS 30 tablet 5  . furosemide (LASIX) 20 MG tablet Take 1 tablet (20 mg total) by mouth daily. 30 tablet 5  . gabapentin (NEURONTIN) 300 MG capsule 2 po tid prn 180 capsule 5  . meloxicam (MOBIC) 15 MG tablet Take 1 tablet (15 mg total) by mouth daily as needed for pain. 30 tablet 5  . mirtazapine (REMERON) 30 MG tablet 1 po qd at hs 30 tablet 5  . morphine (MS CONTIN) 15 MG 12 hr tablet Take 1 tablet by mouth 2 (two) times daily.  0  . omeprazole (PRILOSEC) 40 MG capsule Take 1 capsule (40 mg total) by mouth daily. 30 capsule 5  . potassium chloride SA (K-DUR,KLOR-CON) 20 MEQ tablet Take 1 tablet (20 mEq total) by mouth daily. 30 tablet 5  . topiramate (TOPAMAX) 100 MG tablet Take 1 tablet (100 mg total) by mouth daily. 30 tablet 5  . venlafaxine XR (EFFEXOR-XR) 150 MG 24 hr capsule Take 1 capsule (150 mg total) by mouth  daily with breakfast. 30 capsule 5  . zolpidem (AMBIEN) 10 MG tablet Take 1 tablet (10 mg total) by mouth at bedtime as needed. for sleep 30 tablet 5   No facility-administered medications prior to visit.     ROS: Review of Systems  Constitutional: Positive for fatigue. Negative for activity change, appetite change, chills and unexpected weight change.  HENT: Positive for rhinorrhea. Negative for congestion, mouth sores and sinus pressure.   Eyes: Negative for visual disturbance.  Respiratory: Negative for cough and chest tightness.   Gastrointestinal: Negative for abdominal pain and nausea.  Genitourinary: Negative for difficulty urinating, frequency and vaginal pain.  Musculoskeletal: Positive for arthralgias and back pain. Negative for gait problem.  Skin: Negative for pallor and rash.  Neurological: Positive for headaches. Negative for dizziness, tremors, weakness and numbness.  Psychiatric/Behavioral: Positive for sleep disturbance. Negative for confusion, decreased concentration and suicidal ideas. The patient is nervous/anxious.     Objective:  BP 118/74 (BP Location: Left Arm, Patient Position: Sitting, Cuff Size: Normal)   Pulse (!) 108   Temp 98.3 F (36.8 C) (Oral)   Ht 5\' 5"  (1.651 m)   Wt 124 lb (56.2 kg)   SpO2 98%   BMI 20.63 kg/m   BP Readings from Last 3 Encounters:  03/07/19 118/74  10/27/18 124/76  05/23/18 122/74    Wt Readings from Last 3 Encounters:  03/07/19 124 lb (56.2 kg)  10/27/18 129 lb (58.5 kg)  05/23/18 130 lb (59 kg)    Physical Exam Constitutional:      General: She is not in acute distress.    Appearance: She is well-developed.  HENT:     Head: Normocephalic.     Right Ear: External ear normal.     Left Ear: External ear normal.     Nose: Nose normal.  Eyes:     General:        Right eye: No discharge.        Left eye: No discharge.     Conjunctiva/sclera: Conjunctivae normal.     Pupils: Pupils are equal, round, and reactive  to light.  Neck:     Musculoskeletal: Normal range of motion and neck supple.     Thyroid: No thyromegaly.     Vascular: No JVD.     Trachea: No tracheal deviation.  Cardiovascular:     Rate and Rhythm: Normal rate and regular rhythm.     Heart sounds: Normal heart sounds.  Pulmonary:     Effort: No respiratory distress.     Breath sounds: No stridor. No wheezing.  Abdominal:     General: Bowel sounds are normal. There is no distension.     Palpations: Abdomen is soft. There is no mass.     Tenderness: There is no abdominal tenderness. There is no guarding or rebound.  Musculoskeletal:        General: Tenderness present.  Lymphadenopathy:     Cervical: No cervical adenopathy.  Skin:    Findings: No erythema or rash.  Neurological:     Cranial Nerves: No cranial nerve deficit.     Motor: No abnormal muscle tone.     Coordination: Coordination normal.     Deep Tendon Reflexes: Reflexes normal.  Psychiatric:        Behavior: Behavior normal.        Thought Content: Thought content normal.        Judgment: Judgment normal.   R leg shorter caries  Lab Results  Component Value Date   WBC 7.3 10/27/2018   HGB 11.7 (L) 10/27/2018   HCT 35.8 (L) 10/27/2018   PLT 451.0 (H) 10/27/2018   GLUCOSE 74 10/27/2018   CHOL 215 (H) 10/27/2018   TRIG 289.0 (H) 10/27/2018   HDL 42.60 10/27/2018   LDLDIRECT 135.0 10/27/2018   ALT 11 10/27/2018   AST 16 10/27/2018   NA 142 10/27/2018   K 4.8 10/27/2018   CL 108 10/27/2018   CREATININE 0.98 10/27/2018   BUN 8 10/27/2018   CO2 29 10/27/2018   TSH 0.67 10/27/2018    No results found.  Assessment & Plan:   There are no diagnoses linked to this encounter.   No orders of the defined types were placed in this encounter.    Follow-up: No follow-ups on file.  Walker Kehr, MD

## 2019-03-07 NOTE — Assessment & Plan Note (Signed)
On B12 

## 2019-03-07 NOTE — Assessment & Plan Note (Signed)
Dental appt is pending

## 2019-03-07 NOTE — Assessment & Plan Note (Signed)
We discussed age appropriate health related issues, including available/recomended screening tests and vaccinations. We discussed a need for adhering to healthy diet and exercise. Labs were ordered to be later reviewed . All questions were answered.   

## 2019-03-07 NOTE — Assessment & Plan Note (Signed)
Xanax prn 

## 2019-03-07 NOTE — Assessment & Plan Note (Signed)
:   Possibly aggravated by meds:  she was videotaped at work staying asleep x 2 hrs.  Goals: to minimize her meds, rest

## 2019-03-07 NOTE — Assessment & Plan Note (Signed)
Chronic pain 

## 2019-03-07 NOTE — Assessment & Plan Note (Signed)
Stress aggravated, chronic Fioricet prn 3/day  Potential benefits of a long term Fioricet  use as well as potential risks  and complications were explained to the patient and were aknowledged. Reboung HAs discussed.

## 2019-03-09 ENCOUNTER — Other Ambulatory Visit: Payer: Self-pay | Admitting: Internal Medicine

## 2019-03-09 MED ORDER — CEFUROXIME AXETIL 250 MG PO TABS: 250.0000 mg | ORAL_TABLET | Freq: Two times a day (BID) | ORAL | 0 refills | Status: AC

## 2019-05-16 ENCOUNTER — Ambulatory Visit (INDEPENDENT_AMBULATORY_CARE_PROVIDER_SITE_OTHER): Payer: 59 | Admitting: Internal Medicine

## 2019-05-16 DIAGNOSIS — G4710 Hypersomnia, unspecified: Secondary | ICD-10-CM

## 2019-05-16 DIAGNOSIS — G47429 Narcolepsy in conditions classified elsewhere without cataplexy: Secondary | ICD-10-CM | POA: Diagnosis not present

## 2019-05-16 DIAGNOSIS — G4719 Other hypersomnia: Secondary | ICD-10-CM | POA: Diagnosis not present

## 2019-05-16 DIAGNOSIS — R404 Transient alteration of awareness: Secondary | ICD-10-CM

## 2019-05-17 ENCOUNTER — Telehealth: Payer: Self-pay | Admitting: *Deleted

## 2019-05-17 ENCOUNTER — Encounter: Payer: Self-pay | Admitting: Internal Medicine

## 2019-05-17 NOTE — Telephone Encounter (Signed)
Signed letter is upfront for p/u. Pt informed. See 05/17/19 letter.

## 2019-05-17 NOTE — Progress Notes (Addendum)
Virtual Visit via Video Note  I connected with Vanessa Payne on 05/17/19 at  3:00 PM EDT by a video enabled telemedicine application and verified that I am speaking with the correct person using two identifiers.   I discussed the limitations of evaluation and management by telemedicine and the availability of in person appointments. The patient expressed understanding and agreed to proceed.  History of Present Illness: Bari is complaining of problem with work at [employer].  She was suspended from work due to episodes are being spaced out and days that her coworkers and supervisors witnessed at times.  The patient was unaware of this problem.  There was no loss of consciousness, syncope, seizures.  They asked her to submit a drug test.  Her drug test did not reveal any illegal substances.  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  She tells me that all morning parts are well isolated and there is no chance for her to have an accident.  She has another job at [employer] that she does on her days off.  There has been a lot of stress at home.  Her husband's declared that he is planning to move to [place] to live with his parents after he completes treatment for his brain cancer.  There are financial difficulties.  She has not been sleeping well.  She states that she has to have 2 jobs to meet her financial responsibilities. She continues to smoke.  She does not drink.  There has been no runny nose, cough, chest pain, shortness of breath, abdominal pain, diarrhea, constipation, arthralgias, skin rashes.   Observations/Objective: The patient appears to be in no acute distress, looks tired  Assessment and Plan:  See my Assessment and Plan. Follow Up Instructions:    I discussed the assessment and treatment plan with the patient. The patient was provided an opportunity to ask questions and all were answered. The patient agreed with the plan and demonstrated  an understanding of the instructions.   The patient was advised to call back or seek an in-person evaluation if the symptoms worsen or if the condition fails to improve as anticipated.  I provided face-to-face time during this encounter. We were at different locations.   Walker Kehr, MD

## 2019-05-17 NOTE — Assessment & Plan Note (Signed)
The patient denies problems falling asleep during the day at present.

## 2019-05-17 NOTE — Assessment & Plan Note (Signed)
Recent episodes of being spaced out.  Likely multifactorial.  Patient was advised to reduce stress, stop working her second job as soon as possible, get enough sleep, cut back on her meds (discussed).  She was advised not to drive.  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  I dictated a letter for her to return to work, however, it is up to her supervisor to eventually decided whether she can continue.  I emphasized the need for stress reduction, good sleep, rest.  She will reduce the use of meds that are potentially sedating.

## 2019-07-06 ENCOUNTER — Other Ambulatory Visit: Payer: Self-pay

## 2019-07-06 ENCOUNTER — Ambulatory Visit (INDEPENDENT_AMBULATORY_CARE_PROVIDER_SITE_OTHER): Payer: 59 | Admitting: Internal Medicine

## 2019-07-06 ENCOUNTER — Encounter: Payer: Self-pay | Admitting: Internal Medicine

## 2019-07-06 DIAGNOSIS — R404 Transient alteration of awareness: Secondary | ICD-10-CM

## 2019-07-06 DIAGNOSIS — R42 Dizziness and giddiness: Secondary | ICD-10-CM

## 2019-07-06 NOTE — Progress Notes (Signed)
Virtual Visit via Video Note  I connected with Vanessa Payne on 07/06/19 at  9:30 AM EDT by a video enabled telemedicine application and verified that I am speaking with the correct person using two identifiers.   I discussed the limitations of evaluation and management by telemedicine and the availability of in person appointments. The patient expressed understanding and agreed to proceed.  History of Present Illness: We need to follow-up on her " spaced out episodes" at work.  They have completely resolved.  This will continue today she had cold-like episode.  She took Sudafed and felt lightheaded.  She was sent home from work by cab.  The symptoms have resolved.  Today she is feeling back to normal herself and is ready to go back to work.  There has been no runny nose, cough, chest pain, shortness of breath, abdominal pain, diarrhea, constipation, arthralgias, skin rashes.  No dizziness headache, headache, lightheadedness, weakness.   Observations/Objective: The patient appears to be in no acute distress, looks well.  Assessment and Plan:  See my Assessment and Plan. Follow Up Instructions:    I discussed the assessment and treatment plan with the patient. The patient was provided an opportunity to ask questions and all were answered. The patient agreed with the plan and demonstrated an understanding of the instructions.   The patient was advised to call back or seek an in-person evaluation if the symptoms worsen or if the condition fails to improve as anticipated.  I provided face-to-face time during this encounter. We were at different locations.   Walker Kehr, MD

## 2019-07-06 NOTE — Progress Notes (Deleted)
   Subjective:  Patient ID: Vanessa Payne, female    DOB: August 05, 1971  Age: 48 y.o. MRN: [id number]  CC: No chief complaint on file.   HPI Vanessa Payne presents for ***  Outpatient Medications Prior to Visit  Medication Sig Dispense Refill  . Albuterol Sulfate (PROAIR RESPICLICK) [id number] (90 BASE) MCG/ACT AEPB Inhale 1-2 puffs into the lungs 4 (four) times daily as needed. 1 each 11  . ALPRAZolam (XANAX) 0.5 MG tablet TAKE 1 TO 2 TABLETS BY MOUTH TWICE A DAY AS NEEDED FOR ANXIETY 60 tablet 5  . butalbital-acetaminophen-caffeine (FIORICET) 50-325-40 MG tablet Take 1 tablet by mouth 3 (three) times daily as needed for headache. 90 tablet 5  . Cholecalciferol (VITAMIN D3) 5000 UNITS CAPS Take 1 capsule by mouth daily.    . cyanocobalamin (,VITAMIN B-12,) 1000 MCG/ML injection Inject 1 mL (1,000 mcg total) into the muscle every 21 ( twenty-one) days. 10 mL 11  . cyclobenzaprine (FLEXERIL) 10 MG tablet TAKE ONE-HALF TO ONE TABLET BY MOUTH AT BEDTIME AS NEEDED FOR MUSCLE SPASMS 30 tablet 5  . furosemide (LASIX) 20 MG tablet Take 1 tablet (20 mg total) by mouth daily. 30 tablet 5  . gabapentin (NEURONTIN) 300 MG capsule 2 po tid prn 180 capsule 5  . meloxicam (MOBIC) 15 MG tablet Take 1 tablet (15 mg total) by mouth daily as needed for pain. 30 tablet 5  . mirtazapine (REMERON) 30 MG tablet 1 po qd at hs 30 tablet 5  . omeprazole (PRILOSEC) 40 MG capsule Take 1 capsule (40 mg total) by mouth daily. 30 capsule 5  . potassium chloride SA (K-DUR,KLOR-CON) 20 MEQ tablet Take 1 tablet (20 mEq total) by mouth daily. 30 tablet 5  . topiramate (TOPAMAX) 100 MG tablet Take 1 tablet (100 mg total) by mouth daily. 30 tablet 5  . venlafaxine XR (EFFEXOR-XR) 150 MG 24 hr capsule Take 1 capsule (150 mg total) by mouth daily with breakfast. 30 capsule 5  . zolpidem (AMBIEN) 10 MG tablet Take 1 tablet (10 mg total) by mouth at bedtime as needed. for sleep 30 tablet 5   No facility-administered medications prior  to visit.     ROS: Review of Systems  Objective:  There were no vitals taken for this visit.  BP Readings from Last 3 Encounters:  03/07/19 118/74  10/27/18 124/76  05/23/18 122/74    Wt Readings from Last 3 Encounters:  03/07/19 124 lb (56.2 kg)  10/27/18 129 lb (58.5 kg)  05/23/18 130 lb (59 kg)    Physical Exam  Lab Results  Component Value Date   WBC 10.4 03/07/2019   HGB 12.5 03/07/2019   HCT 37.5 03/07/2019   PLT 403.0 (H) 03/07/2019   GLUCOSE 68 (L) 03/07/2019   CHOL 204 (H) 03/07/2019   TRIG 175.0 (H) 03/07/2019   HDL 43.10 03/07/2019   LDLDIRECT 135.0 10/27/2018   LDLCALC 126 (H) 03/07/2019   ALT 13 03/07/2019   AST 15 03/07/2019   NA 141 03/07/2019   K 3.9 03/07/2019   CL 109 03/07/2019   CREATININE 1.00 03/07/2019   BUN 8 03/07/2019   CO2 22 03/07/2019   TSH 1.41 03/07/2019    No results found.  Assessment & Plan:   There are no diagnoses linked to this encounter.   No orders of the defined types were placed in this encounter.    Follow-up: No follow-ups on file.  Walker Kehr, MD

## 2019-07-06 NOTE — Assessment & Plan Note (Signed)
The symptoms have completely resolved.  Okay to go back to work.  Hydrate yourself well

## 2019-07-06 NOTE — Assessment & Plan Note (Signed)
Resolved.  No relapse.

## 2019-07-18 ENCOUNTER — Encounter: Payer: Self-pay | Admitting: Internal Medicine

## 2019-07-18 ENCOUNTER — Ambulatory Visit (INDEPENDENT_AMBULATORY_CARE_PROVIDER_SITE_OTHER): Payer: 59 | Admitting: Internal Medicine

## 2019-07-18 DIAGNOSIS — J209 Acute bronchitis, unspecified: Secondary | ICD-10-CM | POA: Diagnosis not present

## 2019-07-18 MED ORDER — DOXYCYCLINE HYCLATE 100 MG PO TABS: 100.0000 mg | ORAL_TABLET | Freq: Two times a day (BID) | ORAL | 0 refills | Status: DC

## 2019-07-18 MED ORDER — PROMETHAZINE-DM 6.25-15 MG/5ML PO SYRP: 5.0000 mL | ORAL_SOLUTION | Freq: Four times a day (QID) | ORAL | 0 refills | Status: DC | PRN

## 2019-07-18 MED ORDER — PREDNISONE 20 MG PO TABS: ORAL_TABLET | ORAL | 0 refills | Status: DC

## 2019-07-18 NOTE — Assessment & Plan Note (Signed)
Suspect could be even COPD exacerbation although this is not documented but 20+ pack year history. Rx doxycycline and prednisone and promethazine/dm cough medicine. She did not want repeat covid-19 testing since initial was negative. She is asked to remain out of work until symptoms improve.

## 2019-07-18 NOTE — Progress Notes (Signed)
Virtual Visit via Video Note  I connected with Vanessa Payne on 07/18/19 at  2:40 PM EDT by a video enabled telemedicine application and verified that I am speaking with the correct person using two identifiers.  The patient and the provider were at separate locations throughout the entire encounter.   I discussed the limitations of evaluation and management by telemedicine and the availability of in person appointments. The patient expressed understanding and agreed to proceed.  History of Present Illness: The patient is a 48 y.o. female with visit for bronchitis starting 8 days ago. Started 8 days ago and got CXR and given prednisone, z-pack, tessalon perles, albuterol inhaler. Has been using albuterol inhaler 5-10 times per day and gets about 1 hour relief from it. Still smoking about the same. Took all the antibiotic and this did not help much. Is still finishing the prednisone and this did seem to help for a day or two. Having severe cough, sore throat, lack of voice, SOB. She got CXR at urgent care and they told her it was bronchitis. She states she got covid-19 testing then and it was negative. Denies improvement significant. Overall it is worsening.   Observations/Objective: Appearance: appears sick, breathing appears normal but voice is hoarse and coughing throughout visit, casual grooming, abdomen does not appear distended, throat with drainage and redness, memory normal, mental status is A and O times 3  Assessment and Plan: See problem oriented charting  Follow Up Instructions: Rx doxycycline, prednisone, and promethazine/dm cough syrup  I discussed the assessment and treatment plan with the patient. The patient was provided an opportunity to ask questions and all were answered. The patient agreed with the plan and demonstrated an understanding of the instructions.   The patient was advised to call back or seek an in-person evaluation if the symptoms worsen or if the condition fails  to improve as anticipated.  Hoyt Koch, MD

## 2019-07-20 ENCOUNTER — Other Ambulatory Visit: Payer: Self-pay

## 2019-07-20 ENCOUNTER — Telehealth: Payer: Self-pay | Admitting: Internal Medicine

## 2019-07-20 DIAGNOSIS — Z20822 Contact with and (suspected) exposure to covid-19: Secondary | ICD-10-CM

## 2019-07-20 MED ORDER — LIDOCAINE VISCOUS HCL 2 % MT SOLN: 5.0000 mL | OROMUCOSAL | 1 refills | Status: DC | PRN

## 2019-07-20 NOTE — Telephone Encounter (Signed)
Sent in lidocaine to swish and swallow as needed.

## 2019-07-20 NOTE — Telephone Encounter (Signed)
Pt calling:  States she had a virtual visit with Dr. Sharlet Salina three days ago and was told to call back if she was no better. Pt states that she is still no better and would like to know what next steps are.

## 2019-07-20 NOTE — Telephone Encounter (Signed)
I would offer repeat covid-19 testing since we cannot see negative results. If that is negative she can do CXR at our office.

## 2019-07-20 NOTE — Telephone Encounter (Signed)
Patient willing to get re tested going now order placed   Patient wants to know what she can do for her symptoms. States her throat is hurting so bad that it hurts to talk. States none of the medications she has gotten has touched the pain.  Patient does not want to go through the weekend with nothing

## 2019-07-20 NOTE — Telephone Encounter (Signed)
Patient informed med was sent

## 2019-07-21 ENCOUNTER — Encounter: Payer: Self-pay | Admitting: Internal Medicine

## 2019-07-21 LAB — NOVEL CORONAVIRUS, NAA: SARS-CoV-2, NAA: NOT DETECTED

## 2019-07-24 ENCOUNTER — Other Ambulatory Visit: Payer: Self-pay

## 2019-07-24 ENCOUNTER — Encounter: Payer: Self-pay | Admitting: Internal Medicine

## 2019-07-24 ENCOUNTER — Ambulatory Visit (INDEPENDENT_AMBULATORY_CARE_PROVIDER_SITE_OTHER)
Admission: RE | Admit: 2019-07-24 | Discharge: 2019-07-24 | Disposition: A | Discharge status: Home / Self Care | Payer: 59 | Source: Ambulatory Visit | Attending: Internal Medicine | Admitting: Internal Medicine

## 2019-07-24 ENCOUNTER — Ambulatory Visit (INDEPENDENT_AMBULATORY_CARE_PROVIDER_SITE_OTHER): Payer: 59 | Admitting: Internal Medicine

## 2019-07-24 VITALS — BP 142/86 | HR 95 | Temp 99.0°F | Ht 65.0 in | Wt 125.0 lb

## 2019-07-24 DIAGNOSIS — R05 Cough: Secondary | ICD-10-CM

## 2019-07-24 DIAGNOSIS — R059 Cough, unspecified: Secondary | ICD-10-CM

## 2019-07-24 NOTE — Assessment & Plan Note (Addendum)
Suspect worse as she is smoker. Could have been bacterial or viral etiology. Covid-19 negative so checking CXR. As she has already had 2 courses of antibiotics I am hesitant to do any further unless strong evidence for infection. No signs of sinus infection. Advised to take zyrtec daily to help with drainage. Cough could go on for some time. Overall she seems to be improving and breathing is improved and cough is improved as last visit she was coughing through our visit and today coughing was minimal even with talking. Given sample of breo to see if this can help wheezing in lungs. It is unclear to me if this is chronic with her smoking or acute.

## 2019-07-24 NOTE — Patient Instructions (Signed)
Start taking zyrtec 1 pill daily to help the drainage.   We are checking an x-ray today and will let you know before end of day if any more antibiotics are needed.   We have given you a sample of an inhaler to use 1 puff daily for the next 1-2 weeks to help with lungs.   The cough can take weeks more to clear even if the lung infection is gone.

## 2019-07-24 NOTE — Progress Notes (Signed)
   Subjective:   Patient ID: Vanessa Payne, female    DOB: Jan 22, 1971, 48 y.o.   MRN: [id number]  HPI The patient is a 48 YO female coming in for concerns about ongoing illness. She started feeling sick about 2 weeks ago and seen at urgent care. Covid-19 negative and CXR with bronchitis per her reports. Given z-pack and prednisone. These did not help and so she had virtual visit. Given extended prednisone and doxycycline. Took these and did not feel better. Another covid-19 test done and negative Friday. She states she is no better. Still coughing a lot. Some sinus drainage and sore throat. Some ear pain and pain with swallowing. Coughing is now non-productive. Has albuterol and previously was using this up to 10 times per day. She is using this only 1-2 times per day now and states SOB is better. Still a lot of cough. Denies fevers and never had these. Denies chills or muscle aches. Is using sudafed which helps with drainage but is not taking any allergy medication.  Review of Systems  Constitutional: Positive for activity change, appetite change and fatigue. Negative for chills, fever and unexpected weight change.  HENT: Positive for congestion, postnasal drip, rhinorrhea, sinus pressure, sore throat and voice change. Negative for ear discharge, ear pain, sinus pain, sneezing, tinnitus and trouble swallowing.   Eyes: Negative.   Respiratory: Positive for cough. Negative for chest tightness, shortness of breath and wheezing.   Cardiovascular: Negative.   Gastrointestinal: Negative.   Musculoskeletal: Negative for myalgias.  Neurological: Negative.     Objective:  Physical Exam Constitutional:      Appearance: She is well-developed.  HENT:     Head: Normocephalic and atraumatic.     Comments: Redness oropharynx with clear drainage, ears TMs normal bilaterally, no significant LAD neck.  Neck:     Musculoskeletal: Normal range of motion.  Cardiovascular:     Rate and Rhythm: Normal rate  and regular rhythm.  Pulmonary:     Effort: Pulmonary effort is normal. No respiratory distress.     Breath sounds: Wheezing present. No rales.     Comments: Some wheezing left more than right Abdominal:     General: Bowel sounds are normal. There is no distension.     Palpations: Abdomen is soft.     Tenderness: There is no abdominal tenderness. There is no rebound.  Skin:    General: Skin is warm and dry.  Neurological:     Mental Status: She is alert and oriented to person, place, and time.     Coordination: Coordination normal.     Vitals:   07/24/19 1320  BP: (!) 142/86  Pulse: 95  Temp: 99 F (37.2 C)  TempSrc: Oral  SpO2: 98%  Weight: 125 lb (56.7 kg)  Height: 5\' 5"  (1.651 m)    Assessment & Plan:  Visit time 25 minutes: greater than 50% of that time was spent in face to face counseling and coordination of care with the patient: counseled about potential etiology as well as treatment plan for various options and need for adjustment based on results

## 2019-08-25 ENCOUNTER — Other Ambulatory Visit: Payer: Self-pay | Admitting: Internal Medicine

## 2019-08-28 ENCOUNTER — Other Ambulatory Visit: Payer: Self-pay | Admitting: Internal Medicine

## 2019-09-11 ENCOUNTER — Ambulatory Visit: Payer: 59 | Admitting: Internal Medicine

## 2019-09-29 ENCOUNTER — Other Ambulatory Visit: Payer: Self-pay | Admitting: Internal Medicine

## 2019-10-09 ENCOUNTER — Other Ambulatory Visit: Payer: Self-pay

## 2019-10-09 ENCOUNTER — Ambulatory Visit (INDEPENDENT_AMBULATORY_CARE_PROVIDER_SITE_OTHER): Payer: 59 | Admitting: Internal Medicine

## 2019-10-09 ENCOUNTER — Encounter: Payer: Self-pay | Admitting: Internal Medicine

## 2019-10-09 DIAGNOSIS — G43109 Migraine with aura, not intractable, without status migrainosus: Secondary | ICD-10-CM

## 2019-10-09 DIAGNOSIS — G4719 Other hypersomnia: Secondary | ICD-10-CM

## 2019-10-09 DIAGNOSIS — E538 Deficiency of other specified B group vitamins: Secondary | ICD-10-CM | POA: Diagnosis not present

## 2019-10-09 DIAGNOSIS — G4710 Hypersomnia, unspecified: Secondary | ICD-10-CM

## 2019-10-09 MED ORDER — BUTALBITAL-APAP-CAFFEINE 50-325-40 MG PO TABS: ORAL_TABLET | ORAL | 0 refills | Status: DC

## 2019-10-09 MED ORDER — UBRELVY 100 MG PO TABS: 100.0000 mg | ORAL_TABLET | Freq: Every day | ORAL | 11 refills | Status: DC | PRN

## 2019-10-09 MED ORDER — CYANOCOBALAMIN 1000 MCG/ML IJ SOLN: 1000.0000 ug | INTRAMUSCULAR | 11 refills | Status: DC

## 2019-10-09 NOTE — Assessment & Plan Note (Signed)
D/c Fioricet (wean off)

## 2019-10-09 NOTE — Progress Notes (Signed)
Subjective:  Patient ID: Vanessa Payne, female    DOB: Aug 30, 1971  Age: 48 y.o. MRN: [id number]  CC: No chief complaint on file.   HPI HARLEI BAAL presents for chronic pain, anxiety, insomnia, HAs C/o another incident at work when she was "spaced out" after lunch for some time. She was referred to see their doctor at [facility] - she had an exam and a drug test. She was advised to get off the [place]...  Outpatient Medications Prior to Visit  Medication Sig Dispense Refill  . Albuterol Sulfate (PROAIR RESPICLICK) 123XX123 (90 BASE) MCG/ACT AEPB Inhale 1-2 puffs into the lungs 4 (four) times daily as needed. 1 each 11  . ALPRAZolam (XANAX) 0.5 MG tablet TAKE 1 TO 2 TABLETS BY MOUTH TWICE A DAY AS NEEDED FOR ANXIETY 60 tablet 1  . butalbital-acetaminophen-caffeine (FIORICET) 50-325-40 MG tablet TAKE 1 TABLET BY MOUTH 3 (THREE) TIMES DAILY AS NEEDED FOR HEADACHE. 90 tablet 1  . Cholecalciferol (VITAMIN D3) 5000 UNITS CAPS Take 1 capsule by mouth daily.    . cyanocobalamin (,VITAMIN B-12,) 1000 MCG/ML injection Inject 1 mL (1,000 mcg total) into the muscle every 21 ( twenty-one) days. 10 mL 11  . cyclobenzaprine (FLEXERIL) 10 MG tablet TAKE ONE-HALF TO ONE TABLET BY MOUTH AT BEDTIME AS NEEDED FOR MUSCLE SPASMS 30 tablet 5  . furosemide (LASIX) 20 MG tablet TAKE 1 TABLET BY MOUTH EVERY DAY 30 tablet 5  . gabapentin (NEURONTIN) 300 MG capsule 2 po tid prn 180 capsule 5  . lidocaine (XYLOCAINE) 2 % solution Use as directed 5-10 mLs in the mouth or throat as needed for mouth pain. 100 mL 1  . meloxicam (MOBIC) 15 MG tablet Take 1 tablet (15 mg total) by mouth daily as needed for pain. 30 tablet 5  . mirtazapine (REMERON) 30 MG tablet 1 po qd at hs 30 tablet 5  . omeprazole (PRILOSEC) 40 MG capsule Take 1 capsule (40 mg total) by mouth daily. 30 capsule 5  . potassium chloride SA (K-DUR,KLOR-CON) 20 MEQ tablet Take 1 tablet (20 mEq total) by mouth daily. 30 tablet 5  . topiramate (TOPAMAX) 100 MG  tablet Take 1 tablet (100 mg total) by mouth daily. 30 tablet 5  . venlafaxine XR (EFFEXOR-XR) 150 MG 24 hr capsule TAKE 1 CAPSULE BY MOUTH DAILY WITH BREAKFAST 90 capsule 1  . zolpidem (AMBIEN) 10 MG tablet TAKE 1 TABLET (10 MG TOTAL) BY MOUTH AT BEDTIME AS NEEDED. FOR SLEEP 30 tablet 1  . doxycycline (VIBRA-TABS) 100 MG tablet Take 1 tablet (100 mg total) by mouth 2 (two) times daily. 14 tablet 0  . predniSONE (DELTASONE) 20 MG tablet Days 1-3 take 2 pills daily, days 4-6 take 1 pill daily, days 7-8 take 1/2 pill daily then stop. 11 tablet 0  . promethazine-dextromethorphan (PROMETHAZINE-DM) 6.25-15 MG/5ML syrup Take 5 mLs by mouth 4 (four) times daily as needed for cough. 118 mL 0   No facility-administered medications prior to visit.    ROS: Review of Systems  Constitutional: Positive for fatigue. Negative for activity change, appetite change, chills and unexpected weight change.  HENT: Negative for congestion, mouth sores and sinus pressure.   Eyes: Negative for visual disturbance.  Respiratory: Negative for cough and chest tightness.   Gastrointestinal: Negative for abdominal pain and nausea.  Genitourinary: Negative for difficulty urinating, frequency and vaginal pain.  Musculoskeletal: Positive for arthralgias, back pain and gait problem.  Skin: Negative for pallor and rash.  Neurological: Negative for  dizziness, tremors, weakness, numbness and headaches.  Psychiatric/Behavioral: Positive for sleep disturbance. Negative for confusion and suicidal ideas. The patient is nervous/anxious.     Objective:  BP 114/72 (BP Location: Left Arm, Patient Position: Sitting, Cuff Size: Normal)   Pulse (!) 105   Temp 98.6 F (37 C) (Oral)   Ht 5\' 5"  (1.651 m)   Wt 131 lb (59.4 kg)   SpO2 96%   BMI 21.80 kg/m   BP Readings from Last 3 Encounters:  10/09/19 114/72  07/24/19 (!) 142/86  03/07/19 118/74    Wt Readings from Last 3 Encounters:  10/09/19 131 lb (59.4 kg)  07/24/19 125 lb  (56.7 kg)  03/07/19 124 lb (56.2 kg)    Physical Exam Constitutional:      General: She is not in acute distress.    Appearance: She is well-developed.  HENT:     Head: Normocephalic.     Right Ear: External ear normal.     Left Ear: External ear normal.     Nose: Nose normal.  Eyes:     General:        Right eye: No discharge.        Left eye: No discharge.     Conjunctiva/sclera: Conjunctivae normal.     Pupils: Pupils are equal, round, and reactive to light.  Neck:     Thyroid: No thyromegaly.     Vascular: No JVD.     Trachea: No tracheal deviation.  Cardiovascular:     Rate and Rhythm: Normal rate and regular rhythm.     Heart sounds: Normal heart sounds.  Pulmonary:     Effort: No respiratory distress.     Breath sounds: No stridor. No wheezing.  Abdominal:     General: Bowel sounds are normal. There is no distension.     Palpations: Abdomen is soft. There is no mass.     Tenderness: There is no abdominal tenderness. There is no guarding or rebound.  Musculoskeletal:        General: No tenderness.     Cervical back: Normal range of motion and neck supple.  Lymphadenopathy:     Cervical: No cervical adenopathy.  Skin:    Findings: No erythema or rash.  Neurological:     Cranial Nerves: No cranial nerve deficit.     Motor: No abnormal muscle tone.     Coordination: Coordination normal.     Deep Tendon Reflexes: Reflexes normal.  Psychiatric:        Behavior: Behavior normal.        Thought Content: Thought content normal.        Judgment: Judgment normal.    A/o/c  Lab Results  Component Value Date   WBC 10.4 03/07/2019   HGB 12.5 03/07/2019   HCT 37.5 03/07/2019   PLT 403.0 (H) 03/07/2019   GLUCOSE 68 (L) 03/07/2019   CHOL 204 (H) 03/07/2019   TRIG 175.0 (H) 03/07/2019   HDL 43.10 03/07/2019   LDLDIRECT 135.0 10/27/2018   LDLCALC 126 (H) 03/07/2019   ALT 13 03/07/2019   AST 15 03/07/2019   NA 141 03/07/2019   K 3.9 03/07/2019   CL 109  03/07/2019   CREATININE 1.00 03/07/2019   BUN 8 03/07/2019   CO2 22 03/07/2019   TSH 1.41 03/07/2019    DG Chest 2 View  Result Date: 07/24/2019 CLINICAL DATA:  Cough and wheezing EXAM: CHEST - 2 VIEW COMPARISON:  March 27, 2009 FINDINGS: Lungs are clear. Heart size  and pulmonary vascularity are normal. No adenopathy. No bone lesions. IMPRESSION: No edema or consolidation. Electronically Signed   By: Lowella Grip III M.D.   On: 07/24/2019 19:40    Assessment & Plan:   There are no diagnoses linked to this encounter.   No orders of the defined types were placed in this encounter.    Follow-up: No follow-ups on file.  Walker Kehr, MD

## 2019-10-09 NOTE — Assessment & Plan Note (Signed)
On B12 

## 2019-10-09 NOTE — Assessment & Plan Note (Signed)
The pt had another incident at work when she was "spaced out" after lunch for some time. She was referred to see their doctor at [facility] - she had an exam and a drug test. She was advised to get off the [place]... Wean off Fioricet Use Ubrelvi prn

## 2019-10-09 NOTE — Patient Instructions (Signed)
Wean off Fioricet Use Ubrelvi as needed

## 2019-10-20 ENCOUNTER — Other Ambulatory Visit: Payer: Self-pay | Admitting: Internal Medicine

## 2019-10-25 ENCOUNTER — Other Ambulatory Visit: Payer: Self-pay | Admitting: Internal Medicine

## 2019-10-25 NOTE — Telephone Encounter (Signed)
Requested medication (s) are due for refill today:no  Requested medication (s) are on the active medication list: yes  Last refill:  10/09/2019  Future visit scheduled: no  Notes to clinic:  This refill cannot be delegated    Requested Prescriptions  Pending Prescriptions Disp Refills   butalbital-acetaminophen-caffeine (FIORICET) 50-325-40 MG tablet 45 tablet 0    Sig: Wean off as we discussed      Not Delegated - Analgesics:  Non-Opioid Analgesic Combinations Failed - 10/25/2019  1:56 PM      Failed - This refill cannot be delegated      Passed - Valid encounter within last 12 months    Recent Outpatient Visits           3 months ago Cough   [place], Elizabeth A, MD   3 months ago Acute bronchitis, unspecified organism   [place], Elizabeth A, MD   3 months ago [place], Evie Lacks, MD   5 months ago Daytime hypersomnia   [place], Evie Lacks, MD   7 months ago Well adult exam   [facility], Evie Lacks, MD

## 2019-10-25 NOTE — Telephone Encounter (Signed)
Medication Refill - Medication:  butalbital-acetaminophen-caffeine (FIORICET) 50-325-40 MG tablet [id number]    Preferred Pharmacy (with phone number or street name): [facility] [place], [place] - [address]  [id number] [place] [id number]  Phone: [phone] Fax: [phone]      Agent: Please be advised that RX refills may take up to 3 business days. We ask that you follow-up with your pharmacy.

## 2019-10-26 ENCOUNTER — Telehealth: Payer: Self-pay | Admitting: Internal Medicine

## 2019-10-26 NOTE — Telephone Encounter (Signed)
butalbital-acetaminophen-caffeine (FIORICET) 50-325-40 MG tablet 45 tablet 0 10/09/2019    Sig: Wean off as we discussed    This script was not clear to the pharmacy.... pls reach out to Mazie Mellette, [place] Phone:  [phone]  Fax:  [phone]

## 2019-10-27 NOTE — Telephone Encounter (Signed)
Pharmacy is trying to fill original RX, the need to know how you want patient to wean off (amount per day) in order to fill

## 2019-10-27 NOTE — Telephone Encounter (Signed)
Please clarify on instructions

## 2019-10-27 NOTE — Telephone Encounter (Signed)
She was supposed to wean her off the Fioricet.  The prescription was not intended to be refilled.

## 2019-10-29 NOTE — Telephone Encounter (Signed)
Pulmonary today she taking?  Thanks

## 2019-10-30 NOTE — Telephone Encounter (Signed)
Pulmonary today she taking? ??

## 2019-10-30 NOTE — Telephone Encounter (Signed)
Sorry, this is a typo.  I meant to ask - how many days he is taking now?  Thanks

## 2019-10-31 NOTE — Telephone Encounter (Signed)
The pharmacy has not given her this prescription yet since the need specific instructions. She has not started the weaning off process due to not having medication.

## 2019-11-01 MED ORDER — BUTALBITAL-APAP-CAFFEINE 50-325-40 MG PO TABS: ORAL_TABLET | ORAL | 0 refills | Status: DC

## 2019-11-03 ENCOUNTER — Telehealth: Payer: Self-pay

## 2019-11-03 NOTE — Telephone Encounter (Signed)
Message from Plan CaseId:[id number];Status:Approved;Review Type:Prior Auth;Coverage Start Date:10/04/2019;Coverage End Date:11/02/2020;

## 2019-11-14 ENCOUNTER — Ambulatory Visit: Payer: 59 | Attending: Internal Medicine

## 2019-11-14 DIAGNOSIS — Z20822 Contact with and (suspected) exposure to covid-19: Secondary | ICD-10-CM

## 2019-11-15 LAB — NOVEL CORONAVIRUS, NAA: SARS-CoV-2, NAA: NOT DETECTED

## 2019-11-22 ENCOUNTER — Other Ambulatory Visit: Payer: Self-pay | Admitting: Internal Medicine

## 2019-11-28 ENCOUNTER — Other Ambulatory Visit: Payer: Self-pay | Admitting: Internal Medicine

## 2020-01-12 ENCOUNTER — Other Ambulatory Visit: Payer: Self-pay | Admitting: Internal Medicine

## 2020-01-15 ENCOUNTER — Other Ambulatory Visit: Payer: Self-pay | Admitting: Internal Medicine

## 2020-02-07 ENCOUNTER — Other Ambulatory Visit: Payer: Self-pay

## 2020-02-07 ENCOUNTER — Ambulatory Visit (INDEPENDENT_AMBULATORY_CARE_PROVIDER_SITE_OTHER): Payer: 59 | Admitting: Internal Medicine

## 2020-02-07 ENCOUNTER — Encounter: Payer: Self-pay | Admitting: Internal Medicine

## 2020-02-07 DIAGNOSIS — E538 Deficiency of other specified B group vitamins: Secondary | ICD-10-CM | POA: Diagnosis not present

## 2020-02-07 DIAGNOSIS — G8929 Other chronic pain: Secondary | ICD-10-CM

## 2020-02-07 DIAGNOSIS — N926 Irregular menstruation, unspecified: Secondary | ICD-10-CM | POA: Insufficient documentation

## 2020-02-07 DIAGNOSIS — R8281 Pyuria: Secondary | ICD-10-CM | POA: Insufficient documentation

## 2020-02-07 DIAGNOSIS — R109 Unspecified abdominal pain: Secondary | ICD-10-CM | POA: Insufficient documentation

## 2020-02-07 DIAGNOSIS — M545 Low back pain: Secondary | ICD-10-CM | POA: Diagnosis not present

## 2020-02-07 DIAGNOSIS — R1013 Epigastric pain: Secondary | ICD-10-CM

## 2020-02-07 LAB — URINALYSIS
Bilirubin Urine: NEGATIVE
Hgb urine dipstick: NEGATIVE
Ketones, ur: NEGATIVE
Leukocytes,Ua: NEGATIVE
Nitrite: NEGATIVE
Specific Gravity, Urine: 1.02 (ref 1.000–1.030)
Total Protein, Urine: NEGATIVE
Urine Glucose: NEGATIVE
Urobilinogen, UA: 0.2 (ref 0.0–1.0)
pH: 6 (ref 5.0–8.0)

## 2020-02-07 LAB — BASIC METABOLIC PANEL
BUN: 13 mg/dL (ref 6–23)
CO2: 27 mEq/L (ref 19–32)
Calcium: 9.3 mg/dL (ref 8.4–10.5)
Chloride: 107 mEq/L (ref 96–112)
Creatinine, Ser: 1.09 mg/dL (ref 0.40–1.20)
GFR: 53.33 mL/min — ABNORMAL LOW (ref >60.00)
Glucose, Bld: 75 mg/dL (ref 70–99)
Potassium: 3.5 mEq/L (ref 3.5–5.1)
Sodium: 142 mEq/L (ref 135–145)

## 2020-02-07 LAB — HEPATIC FUNCTION PANEL
ALT: 9 U/L (ref 0–35)
AST: 13 U/L (ref 0–37)
Albumin: 3.7 g/dL (ref 3.5–5.2)
Alkaline Phosphatase: 107 U/L (ref 39–117)
Bilirubin, Direct: 0 mg/dL (ref 0.0–0.3)
Total Bilirubin: 0.2 mg/dL (ref 0.2–1.2)
Total Protein: 7 g/dL (ref 6.0–8.3)

## 2020-02-07 LAB — H. PYLORI ANTIBODY, IGG: H Pylori IgG: NEGATIVE

## 2020-02-07 LAB — LIPASE: Lipase: 51 U/L (ref 11.0–59.0)

## 2020-02-07 LAB — TSH: TSH: 0.63 u[IU]/mL (ref 0.35–4.50)

## 2020-02-07 MED ORDER — PANTOPRAZOLE SODIUM 40 MG PO TBEC: 40.0000 mg | DELAYED_RELEASE_TABLET | Freq: Two times a day (BID) | ORAL | 5 refills | Status: DC

## 2020-02-07 NOTE — Assessment & Plan Note (Signed)
On B12 

## 2020-02-07 NOTE — Assessment & Plan Note (Signed)
Taking a lot of Advil, Tylenol since she has been off pain management - d/c Ref to Dr Hardin [facility] Abd [place] Protonix bid

## 2020-02-07 NOTE — Progress Notes (Signed)
Subjective:  Patient ID: Vanessa Payne, female    DOB: 10-Dec-1970  Age: 49 y.o. MRN: [id number]  CC: No chief complaint on file.   HPI Vanessa Payne presents for abd pain in the epigastric and LUQ  Area - like hunger pains - all day x 4-6 weeks. Better after meals. Not waking up from sleep... Taking a lot of Advil, Tylenol since she has been off pain management Outpatient Medications Prior to Visit  Medication Sig Dispense Refill  . Albuterol Sulfate (PROAIR RESPICLICK) 123XX123 (90 BASE) MCG/ACT AEPB Inhale 1-2 puffs into the lungs 4 (four) times daily as needed. 1 each 11  . ALPRAZolam (XANAX) 0.5 MG tablet TAKE 1 TO 2 TABLETS BY MOUTH TWICE A DAY AS NEEDED FOR ANXIETY 60 tablet 1  . Cholecalciferol (VITAMIN D3) 5000 UNITS CAPS Take 1 capsule by mouth daily.    . cyanocobalamin (,VITAMIN B-12,) 1000 MCG/ML injection Inject 1 mL (1,000 mcg total) into the muscle every 14 (fourteen) days. 10 mL 11  . cyclobenzaprine (FLEXERIL) 10 MG tablet TAKE ONE-HALF TO ONE TABLET BY MOUTH AT BEDTIME AS NEEDED FOR MUSCLE SPASMS 30 tablet 5  . furosemide (LASIX) 20 MG tablet TAKE 1 TABLET BY MOUTH EVERY DAY 30 tablet 5  . gabapentin (NEURONTIN) 300 MG capsule 2 po tid prn 180 capsule 5  . lidocaine (XYLOCAINE) 2 % solution Use as directed 5-10 mLs in the mouth or throat as needed for mouth pain. 100 mL 1  . meloxicam (MOBIC) 15 MG tablet TAKE 1 TABLET BY MOUTH EVERY DAY AS NEEDED FOR PAIN 30 tablet 5  . mirtazapine (REMERON) 30 MG tablet 1 po qd at hs 30 tablet 5  . omeprazole (PRILOSEC) 40 MG capsule Take 1 capsule (40 mg total) by mouth daily. 30 capsule 5  . potassium chloride SA (K-DUR,KLOR-CON) 20 MEQ tablet Take 1 tablet (20 mEq total) by mouth daily. 30 tablet 5  . topiramate (TOPAMAX) 100 MG tablet TAKE 1 TABLET BY MOUTH EVERY DAY 30 tablet 5  . Ubrogepant (UBRELVY) 100 MG TABS Take 100 mg by mouth daily as needed. 6 tablet 11  . venlafaxine XR (EFFEXOR-XR) 150 MG 24 hr capsule TAKE 1 CAPSULE BY  MOUTH DAILY WITH BREAKFAST 90 capsule 1  . zolpidem (AMBIEN) 10 MG tablet TAKE 1 TABLET (10 MG TOTAL) BY MOUTH AT BEDTIME AS NEEDED FOR SLEEP 30 tablet 1   No facility-administered medications prior to visit.    ROS: Review of Systems  Constitutional: Positive for fatigue and unexpected weight change. Negative for activity change, appetite change and chills.  HENT: Negative for congestion, mouth sores and sinus pressure.   Eyes: Negative for visual disturbance.  Respiratory: Negative for cough and chest tightness.   Gastrointestinal: Positive for abdominal pain. Negative for nausea.  Genitourinary: Negative for difficulty urinating, frequency and vaginal pain.  Musculoskeletal: Positive for arthralgias and back pain. Negative for gait problem.  Skin: Negative for pallor and rash.  Neurological: Negative for dizziness, tremors, weakness, numbness and headaches.  Psychiatric/Behavioral: Negative for confusion, sleep disturbance and suicidal ideas.    Objective:  BP 122/64 (BP Location: Right Arm, Patient Position: Sitting, Cuff Size: Normal)   Pulse 82   Temp 97.8 F (36.6 C) (Oral)   Ht 5\' 5"  (1.651 m)   Wt 121 lb (54.9 kg)   SpO2 98%   BMI 20.14 kg/m   BP Readings from Last 3 Encounters:  02/07/20 122/64  10/09/19 114/72  07/24/19 (!) 142/86  Wt Readings from Last 3 Encounters:  02/07/20 121 lb (54.9 kg)  10/09/19 131 lb (59.4 kg)  07/24/19 125 lb (56.7 kg)    Physical Exam Constitutional:      General: She is not in acute distress.    Appearance: She is well-developed.  HENT:     Head: Normocephalic.     Right Ear: External ear normal.     Left Ear: External ear normal.     Nose: Nose normal.  Eyes:     General:        Right eye: No discharge.        Left eye: No discharge.     Conjunctiva/sclera: Conjunctivae normal.     Pupils: Pupils are equal, round, and reactive to light.  Neck:     Thyroid: No thyromegaly.     Vascular: No JVD.     Trachea: No  tracheal deviation.  Cardiovascular:     Rate and Rhythm: Normal rate and regular rhythm.     Heart sounds: Normal heart sounds.  Pulmonary:     Effort: No respiratory distress.     Breath sounds: No stridor. No wheezing.  Abdominal:     General: Bowel sounds are normal. There is no distension.     Palpations: Abdomen is soft. There is no mass.     Tenderness: There is no abdominal tenderness. There is no guarding or rebound.  Musculoskeletal:        General: No tenderness.     Cervical back: Normal range of motion and neck supple.  Lymphadenopathy:     Cervical: No cervical adenopathy.  Skin:    Findings: No erythema or rash.  Neurological:     Mental Status: She is oriented to person, place, and time.     Cranial Nerves: No cranial nerve deficit.     Motor: No abnormal muscle tone.     Coordination: Coordination normal.     Gait: Gait abnormal.     Deep Tendon Reflexes: Reflexes normal.  Psychiatric:        Behavior: Behavior normal.        Thought Content: Thought content normal.        Judgment: Judgment normal.   Thin  Lab Results  Component Value Date   WBC 10.4 03/07/2019   HGB 12.5 03/07/2019   HCT 37.5 03/07/2019   PLT 403.0 (H) 03/07/2019   GLUCOSE 68 (L) 03/07/2019   CHOL 204 (H) 03/07/2019   TRIG 175.0 (H) 03/07/2019   HDL 43.10 03/07/2019   LDLDIRECT 135.0 10/27/2018   LDLCALC 126 (H) 03/07/2019   ALT 13 03/07/2019   AST 15 03/07/2019   NA 141 03/07/2019   K 3.9 03/07/2019   CL 109 03/07/2019   CREATININE 1.00 03/07/2019   BUN 8 03/07/2019   CO2 22 03/07/2019   TSH 1.41 03/07/2019    DG Chest 2 View  Result Date: 07/24/2019 CLINICAL DATA:  Cough and wheezing EXAM: CHEST - 2 VIEW COMPARISON:  March 27, 2009 FINDINGS: Lungs are clear. Heart size and pulmonary vascularity are normal. No adenopathy. No bone lesions. IMPRESSION: No edema or consolidation. Electronically Signed   By: Lowella Grip III M.D.   On: 07/24/2019 19:40    Assessment &  Plan:   There are no diagnoses linked to this encounter.   No orders of the defined types were placed in this encounter.    Follow-up: No follow-ups on file.  Walker Kehr, MD

## 2020-02-07 NOTE — Addendum Note (Signed)
Addended by: Boris Lown B on: 02/07/2020 10:16 AM   Modules accepted: Orders

## 2020-02-07 NOTE — Assessment & Plan Note (Signed)
Taking a lot of Advil, Tylenol since she has been off pain management Go back to Pain Management

## 2020-02-08 LAB — CBC WITH DIFFERENTIAL/PLATELET
Basophils Absolute: 0 10*3/uL (ref 0.0–0.1)
Basophils Relative: 0.4 % (ref 0.0–3.0)
Eosinophils Absolute: 0 10*3/uL (ref 0.0–0.7)
Eosinophils Relative: 0.2 % (ref 0.0–5.0)
HCT: 34.9 % — ABNORMAL LOW (ref 36.0–46.0)
Hemoglobin: 11.4 g/dL — ABNORMAL LOW (ref 12.0–15.0)
Lymphocytes Relative: 31.6 % (ref 12.0–46.0)
Lymphs Abs: 1.7 10*3/uL (ref 0.7–4.0)
MCHC: 32.7 g/dL (ref 30.0–36.0)
MCV: 90.5 fl (ref 78.0–100.0)
Monocytes Absolute: 0.6 10*3/uL (ref 0.1–1.0)
Monocytes Relative: 11.7 % (ref 3.0–12.0)
Neutro Abs: 3 10*3/uL (ref 1.4–7.7)
Neutrophils Relative %: 56.1 % (ref 43.0–77.0)
Platelets: 339 10*3/uL (ref 150.0–400.0)
RBC: 3.85 Mil/uL — ABNORMAL LOW (ref 3.87–5.11)
RDW: 16.3 % — ABNORMAL HIGH (ref 11.5–15.5)
WBC: 5.4 10*3/uL (ref 4.0–10.5)

## 2020-02-21 ENCOUNTER — Ambulatory Visit
Admission: RE | Admit: 2020-02-21 | Discharge: 2020-02-21 | Disposition: A | Discharge status: Home / Self Care | Payer: 59 | Source: Ambulatory Visit | Attending: Internal Medicine | Admitting: Internal Medicine

## 2020-02-21 ENCOUNTER — Other Ambulatory Visit: Payer: Self-pay

## 2020-02-21 ENCOUNTER — Other Ambulatory Visit: Payer: Self-pay | Admitting: Internal Medicine

## 2020-03-15 ENCOUNTER — Other Ambulatory Visit: Payer: Self-pay | Admitting: Internal Medicine

## 2020-03-19 ENCOUNTER — Other Ambulatory Visit: Payer: Self-pay | Admitting: Internal Medicine

## 2020-03-20 ENCOUNTER — Ambulatory Visit (INDEPENDENT_AMBULATORY_CARE_PROVIDER_SITE_OTHER): Payer: 59 | Admitting: Internal Medicine

## 2020-03-20 ENCOUNTER — Other Ambulatory Visit: Payer: Self-pay | Admitting: Internal Medicine

## 2020-03-20 ENCOUNTER — Other Ambulatory Visit: Payer: Self-pay

## 2020-03-20 ENCOUNTER — Encounter: Payer: Self-pay | Admitting: Internal Medicine

## 2020-03-20 DIAGNOSIS — M545 Low back pain, unspecified: Secondary | ICD-10-CM

## 2020-03-20 DIAGNOSIS — G47429 Narcolepsy in conditions classified elsewhere without cataplexy: Secondary | ICD-10-CM

## 2020-03-20 DIAGNOSIS — K029 Dental caries, unspecified: Secondary | ICD-10-CM

## 2020-03-20 DIAGNOSIS — G43109 Migraine with aura, not intractable, without status migrainosus: Secondary | ICD-10-CM | POA: Diagnosis not present

## 2020-03-20 DIAGNOSIS — Z0000 Encounter for general adult medical examination without abnormal findings: Secondary | ICD-10-CM

## 2020-03-20 DIAGNOSIS — F411 Generalized anxiety disorder: Secondary | ICD-10-CM

## 2020-03-20 DIAGNOSIS — R404 Transient alteration of awareness: Secondary | ICD-10-CM

## 2020-03-20 DIAGNOSIS — E538 Deficiency of other specified B group vitamins: Secondary | ICD-10-CM | POA: Diagnosis not present

## 2020-03-20 DIAGNOSIS — G8929 Other chronic pain: Secondary | ICD-10-CM

## 2020-03-20 DIAGNOSIS — F17200 Nicotine dependence, unspecified, uncomplicated: Secondary | ICD-10-CM

## 2020-03-20 DIAGNOSIS — I700 Atherosclerosis of aorta: Secondary | ICD-10-CM

## 2020-03-20 MED ORDER — BUTALBITAL-APAP-CAFFEINE 50-325-40 MG PO TABS
1.0000 | ORAL_TABLET | Freq: Three times a day (TID) | ORAL | 2 refills | Status: DC | PRN
Start: 2020-03-20 — End: 2020-06-24

## 2020-03-20 NOTE — Progress Notes (Signed)
Subjective:  Patient ID: Vanessa Payne, female    DOB: 04-04-1971  Age: 49 y.o. MRN: [id number]  CC: No chief complaint on file.   HPI CHARMAYNE ODELL presents for abd pain - better off NSAIDs and on Protonix F/u on abd [place], chronic pain, HAs Ubrelvi not helping Fioricet is the only thing that helps w/HA No somnolence Dental issues - corrected   Outpatient Medications Prior to Visit  Medication Sig Dispense Refill  . Albuterol Sulfate (PROAIR RESPICLICK) 762 (90 BASE) MCG/ACT AEPB Inhale 1-2 puffs into the lungs 4 (four) times daily as needed. 1 each 11  . ALPRAZolam (XANAX) 0.5 MG tablet TAKE 1 TO 2 TABLETS BY MOUTH TWICE A DAY AS NEEDED FOR ANXIETY 60 tablet 1  . Cholecalciferol (VITAMIN D3) 5000 UNITS CAPS Take 1 capsule by mouth daily.    . cyanocobalamin (,VITAMIN B-12,) 1000 MCG/ML injection Inject 1 mL (1,000 mcg total) into the muscle every 14 (fourteen) days. 10 mL 11  . cyclobenzaprine (FLEXERIL) 10 MG tablet TAKE ONE-HALF TO ONE TABLET BY MOUTH AT BEDTIME AS NEEDED FOR MUSCLE SPASMS 30 tablet 5  . furosemide (LASIX) 20 MG tablet TAKE 1 TABLET BY MOUTH EVERY DAY 30 tablet 5  . gabapentin (NEURONTIN) 300 MG capsule TAKE 2 CAPSULES BY MOUTH 3 TIMES A DAY AS NEEDED 180 capsule 5  . lidocaine (XYLOCAINE) 2 % solution Use as directed 5-10 mLs in the mouth or throat as needed for mouth pain. 100 mL 1  . meloxicam (MOBIC) 15 MG tablet TAKE 1 TABLET BY MOUTH EVERY DAY AS NEEDED FOR PAIN 30 tablet 5  . mirtazapine (REMERON) 30 MG tablet TAKE 1 TABLET BY MOUTH EVERY DAY AT BEDTIME 30 tablet 5  . omeprazole (PRILOSEC) 40 MG capsule Take 1 capsule (40 mg total) by mouth daily. 30 capsule 5  . pantoprazole (PROTONIX) 40 MG tablet Take 1 tablet (40 mg total) by mouth 2 (two) times daily. 60 tablet 5  . potassium chloride SA (K-DUR,KLOR-CON) 20 MEQ tablet Take 1 tablet (20 mEq total) by mouth daily. 30 tablet 5  . topiramate (TOPAMAX) 100 MG tablet TAKE 1 TABLET BY MOUTH EVERY DAY 30  tablet 5  . Ubrogepant (UBRELVY) 100 MG TABS Take 100 mg by mouth daily as needed. 6 tablet 11  . venlafaxine XR (EFFEXOR-XR) 150 MG 24 hr capsule TAKE 1 CAPSULE BY MOUTH DAILY WITH BREAKFAST 90 capsule 1  . zolpidem (AMBIEN) 10 MG tablet TAKE 1 TABLET BY MOUTH AT BEDTIME AS NEEDED FOR SLEEP. 30 tablet 1   No facility-administered medications prior to visit.    ROS: Review of Systems  Constitutional: Positive for fatigue. Negative for activity change, appetite change, chills and unexpected weight change.  HENT: Negative for congestion, mouth sores and sinus pressure.   Eyes: Negative for visual disturbance.  Respiratory: Negative for cough and chest tightness.   Gastrointestinal: Negative for abdominal pain and nausea.  Genitourinary: Negative for difficulty urinating, frequency and vaginal pain.  Musculoskeletal: Positive for arthralgias. Negative for back pain and gait problem.  Skin: Negative for pallor and rash.  Neurological: Negative for dizziness, tremors, weakness, numbness and headaches.  Psychiatric/Behavioral: Negative for confusion, sleep disturbance and suicidal ideas. The patient is nervous/anxious.     Objective:  BP 102/60 (BP Location: Left Arm, Patient Position: Sitting, Cuff Size: Normal)   Pulse 83   Temp 98.3 F (36.8 C) (Oral)   Ht 5\' 5"  (1.651 m)   Wt 119 lb 6 oz (54.1  kg)   SpO2 97%   BMI 19.87 kg/m   BP Readings from Last 3 Encounters:  03/20/20 102/60  02/07/20 122/64  10/09/19 114/72    Wt Readings from Last 3 Encounters:  03/20/20 119 lb 6 oz (54.1 kg)  02/07/20 121 lb (54.9 kg)  10/09/19 131 lb (59.4 kg)    Physical Exam Constitutional:      General: She is not in acute distress.    Appearance: She is well-developed.  HENT:     Head: Normocephalic.     Right Ear: External ear normal.     Left Ear: External ear normal.     Nose: Nose normal.  Eyes:     General:        Right eye: No discharge.        Left eye: No discharge.      Conjunctiva/sclera: Conjunctivae normal.     Pupils: Pupils are equal, round, and reactive to light.  Neck:     Thyroid: No thyromegaly.     Vascular: No JVD.     Trachea: No tracheal deviation.  Cardiovascular:     Rate and Rhythm: Normal rate and regular rhythm.     Heart sounds: Normal heart sounds.  Pulmonary:     Effort: No respiratory distress.     Breath sounds: No stridor. No wheezing.  Abdominal:     General: Bowel sounds are normal. There is no distension.     Palpations: Abdomen is soft. There is no mass.     Tenderness: There is no abdominal tenderness. There is no guarding or rebound.  Musculoskeletal:        General: Tenderness present.     Cervical back: Normal range of motion and neck supple.  Lymphadenopathy:     Cervical: No cervical adenopathy.  Skin:    Findings: No erythema or rash.  Neurological:     Cranial Nerves: No cranial nerve deficit.     Motor: No abnormal muscle tone.     Coordination: Coordination normal.     Deep Tendon Reflexes: Reflexes normal.  Psychiatric:        Behavior: Behavior normal.        Thought Content: Thought content normal.        Judgment: Judgment normal.   limping  Lab Results  Component Value Date   WBC 5.4 02/07/2020   HGB 11.4 (L) 02/07/2020   HCT 34.9 (L) 02/07/2020   PLT 339.0 02/07/2020   GLUCOSE 75 02/07/2020   CHOL 204 (H) 03/07/2019   TRIG 175.0 (H) 03/07/2019   HDL 43.10 03/07/2019   LDLDIRECT 135.0 10/27/2018   LDLCALC 126 (H) 03/07/2019   ALT 9 02/07/2020   AST 13 02/07/2020   NA 142 02/07/2020   K 3.5 02/07/2020   CL 107 02/07/2020   CREATININE 1.09 02/07/2020   BUN 13 02/07/2020   CO2 27 02/07/2020   TSH 0.63 02/07/2020    US Abdomen Complete  Result Date: 02/21/2020 CLINICAL DATA:  49 year old female with epigastric pain. EXAM: ABDOMEN ULTRASOUND COMPLETE COMPARISON:  None. FINDINGS: Gallbladder: No gallstones or wall thickening visualized. No sonographic Murphy sign noted by sonographer.  Common bile duct: Diameter: 1 mm Liver: No focal lesion identified. Within normal limits in parenchymal echogenicity. Portal vein is patent on color Doppler imaging with normal direction of blood flow towards the liver. IVC: No abnormality visualized. Pancreas: Visualized portion unremarkable. Spleen: Size and appearance within normal limits. Right Kidney: Length: 11 cm. Echogenicity within normal limits. No  mass or hydronephrosis visualized. Left Kidney: Length: 10 cm. Echogenicity within normal limits. No mass or hydronephrosis visualized. Abdominal aorta: No aneurysm visualized. There is atherosclerotic calcification of the abdominal aorta. Other findings: None. IMPRESSION: 1. Unremarkable abdominal ultrasound.  No acute findings. 2.  Aortic Atherosclerosis (ICD10-I70.0). Electronically Signed   By: Anner Crete M.D.   On: 02/21/2020 15:27    Assessment & Plan:    Walker Kehr, MD

## 2020-03-20 NOTE — Assessment & Plan Note (Signed)
On Xanax prn ?

## 2020-03-20 NOTE — Assessment & Plan Note (Signed)
  Fioricet is the only thing that helps w/HA. Not to be used at work, when driving etc

## 2020-03-20 NOTE — Assessment & Plan Note (Signed)
Ubrelvi not helping w/HA Fioricet is the only thing that helps w/HA. Not to be used at work, when driving etc No somnolence.

## 2020-03-20 NOTE — Assessment & Plan Note (Signed)
Corrected

## 2020-03-20 NOTE — Assessment & Plan Note (Signed)
On abd [place] 2021 Smoking discussed, statins offered

## 2020-03-20 NOTE — Assessment & Plan Note (Signed)
No relapse Ubrelvi not helping w/HA Fioricet is the only thing that helps w/HA. Not to be used at work, when driving etc No somnolence.

## 2020-03-20 NOTE — Assessment & Plan Note (Signed)
Discussed.

## 2020-03-20 NOTE — Assessment & Plan Note (Signed)
D/c Meloxicam Fioricet is the only thing that helps w/HA. Not to be used at work, when driving etc

## 2020-03-20 NOTE — Assessment & Plan Note (Signed)
On B12 shots 

## 2020-03-20 NOTE — Assessment & Plan Note (Signed)
We discussed age appropriate health related issues, including available/recomended screening tests and vaccinations. We discussed a need for adhering to healthy diet and exercise. Labs were ordered to be later reviewed . All questions were answered.   

## 2020-05-14 ENCOUNTER — Other Ambulatory Visit: Payer: Self-pay | Admitting: Internal Medicine

## 2020-05-15 ENCOUNTER — Other Ambulatory Visit: Payer: Self-pay | Admitting: Internal Medicine

## 2020-05-24 ENCOUNTER — Other Ambulatory Visit: Payer: Self-pay | Admitting: Internal Medicine

## 2020-06-07 ENCOUNTER — Ambulatory Visit
Admission: EM | Admit: 2020-06-07 | Discharge: 2020-06-07 | Disposition: A | Discharge status: Home / Self Care | Payer: BC Managed Care – PPO | Attending: Emergency Medicine | Admitting: Emergency Medicine

## 2020-06-07 DIAGNOSIS — S29011A Strain of muscle and tendon of front wall of thorax, initial encounter: Secondary | ICD-10-CM | POA: Insufficient documentation

## 2020-06-07 MED ORDER — NAPROXEN 500 MG PO TABS
500.0000 mg | ORAL_TABLET | Freq: Two times a day (BID) | ORAL | 0 refills | Status: DC
Start: 2020-06-07 — End: 2020-06-24

## 2020-06-07 NOTE — Discharge Instructions (Addendum)

## 2020-06-07 NOTE — ED Triage Notes (Signed)
Pt presents with complaints of left sided chest pain x 2 weeks. Denies any recent injury or heavy lifting. Reports pain is worse with movement. Denies shortness of breath, cough, nausea, or emesis.
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
## 2020-06-16 ENCOUNTER — Other Ambulatory Visit: Payer: Self-pay | Admitting: Internal Medicine

## 2020-06-24 ENCOUNTER — Encounter: Payer: Self-pay | Admitting: Internal Medicine

## 2020-06-24 ENCOUNTER — Ambulatory Visit (INDEPENDENT_AMBULATORY_CARE_PROVIDER_SITE_OTHER): Payer: BC Managed Care – PPO | Admitting: Internal Medicine

## 2020-06-24 ENCOUNTER — Other Ambulatory Visit: Payer: Self-pay

## 2020-06-24 VITALS — BP 118/82 | HR 89 | Temp 98.4°F | Ht 65.0 in | Wt 109.0 lb

## 2020-06-24 DIAGNOSIS — E538 Deficiency of other specified B group vitamins: Secondary | ICD-10-CM | POA: Diagnosis not present

## 2020-06-24 DIAGNOSIS — G47429 Narcolepsy in conditions classified elsewhere without cataplexy: Secondary | ICD-10-CM

## 2020-06-24 DIAGNOSIS — F411 Generalized anxiety disorder: Secondary | ICD-10-CM | POA: Diagnosis not present

## 2020-06-24 DIAGNOSIS — Z23 Encounter for immunization: Secondary | ICD-10-CM

## 2020-06-24 DIAGNOSIS — G8929 Other chronic pain: Secondary | ICD-10-CM

## 2020-06-24 DIAGNOSIS — G43109 Migraine with aura, not intractable, without status migrainosus: Secondary | ICD-10-CM

## 2020-06-24 DIAGNOSIS — G4700 Insomnia, unspecified: Secondary | ICD-10-CM

## 2020-06-24 DIAGNOSIS — M545 Low back pain: Secondary | ICD-10-CM

## 2020-06-24 MED ORDER — BUTALBITAL-APAP-CAFFEINE 50-325-40 MG PO TABS: 1.0000 | ORAL_TABLET | Freq: Three times a day (TID) | ORAL | 2 refills | Status: DC | PRN

## 2020-06-24 MED ORDER — ALPRAZOLAM 0.5 MG PO TABS: ORAL_TABLET | ORAL | 1 refills | Status: DC

## 2020-06-24 MED ORDER — CYCLOBENZAPRINE HCL 10 MG PO TABS: ORAL_TABLET | ORAL | 5 refills | Status: DC

## 2020-06-24 NOTE — Assessment & Plan Note (Signed)
F/u w/Dr Hardin Negus

## 2020-06-24 NOTE — Assessment & Plan Note (Signed)
Better D/c Zolpidem

## 2020-06-24 NOTE — Addendum Note (Signed)
Addended by: Lauralee Evener C on: 06/24/2020 11:42 AM   Modules accepted: Orders

## 2020-06-24 NOTE — Progress Notes (Signed)
Subjective:  Patient ID: Vanessa Payne, female    DOB: 30-Mar-1971  Age: 49 y.o. MRN: [id number]  CC: No chief complaint on file.   HPI Vanessa Payne presents for panic attacks, HAs, anxiety. Pt [facility] Dr Hardin [facility] Feeling better Sleeping OK  Outpatient Medications Prior to Visit  Medication Sig Dispense Refill  . Albuterol Sulfate (PROAIR RESPICLICK) 202 (90 BASE) MCG/ACT AEPB Inhale 1-2 puffs into the lungs 4 (four) times daily as needed. 1 each 11  . ALPRAZolam (XANAX) 0.5 MG tablet TAKE 1 TO 2 TABLETS BY MOUTH TWICE A DAY AS NEEDED FOR ANXIETY 60 tablet 1  . butalbital-acetaminophen-caffeine (FIORICET) 50-325-40 MG tablet Take 1 tablet by mouth 3 (three) times daily as needed for headache or migraine. 90 tablet 2  . Cholecalciferol (VITAMIN D3) 5000 UNITS CAPS Take 1 capsule by mouth daily.    . cyanocobalamin (,VITAMIN B-12,) 1000 MCG/ML injection Inject 1 mL (1,000 mcg total) into the muscle every 14 (fourteen) days. 10 mL 11  . cyclobenzaprine (FLEXERIL) 10 MG tablet TAKE ONE-HALF TO ONE TABLET BY MOUTH AT BEDTIME AS NEEDED FOR MUSCLE SPASMS 30 tablet 5  . furosemide (LASIX) 20 MG tablet TAKE 1 TABLET BY MOUTH EVERY DAY 30 tablet 5  . gabapentin (NEURONTIN) 300 MG capsule TAKE 2 CAPSULES BY MOUTH 3 TIMES A DAY AS NEEDED 180 capsule 5  . meloxicam (MOBIC) 15 MG tablet TAKE 1 TABLET BY MOUTH EVERY DAY AS NEEDED FOR PAIN 30 tablet 5  . mirtazapine (REMERON) 30 MG tablet TAKE 1 TABLET BY MOUTH EVERY DAY AT BEDTIME 30 tablet 5  . omeprazole (PRILOSEC) 40 MG capsule Take 1 capsule (40 mg total) by mouth daily. 30 capsule 5  . pantoprazole (PROTONIX) 40 MG tablet Take 1 tablet (40 mg total) by mouth 2 (two) times daily. 60 tablet 5  . potassium chloride SA (K-DUR,KLOR-CON) 20 MEQ tablet Take 1 tablet (20 mEq total) by mouth daily. 30 tablet 5  . topiramate (TOPAMAX) 100 MG tablet TAKE 1 TABLET BY MOUTH EVERY DAY 30 tablet 5  . venlafaxine XR (EFFEXOR-XR) 150 MG 24 hr  capsule TAKE 1 CAPSULE BY MOUTH DAILY WITH BREAKFAST 90 capsule 1  . zolpidem (AMBIEN) 10 MG tablet TAKE 1 TABLET BY MOUTH AT BEDTIME AS NEEDED FOR SLEEP. 30 tablet 1  . lidocaine (XYLOCAINE) 2 % solution Use as directed 5-10 mLs in the mouth or throat as needed for mouth pain. (Patient not taking: Reported on 06/24/2020) 100 mL 1  . naproxen (NAPROSYN) 500 MG tablet Take 1 tablet (500 mg total) by mouth 2 (two) times daily. (Patient not taking: Reported on 06/24/2020) 30 tablet 0   No facility-administered medications prior to visit.    ROS: Review of Systems  Constitutional: Negative for activity change, appetite change, chills, fatigue and unexpected weight change.  HENT: Negative for congestion, mouth sores and sinus pressure.   Eyes: Negative for visual disturbance.  Respiratory: Negative for cough and chest tightness.   Gastrointestinal: Negative for abdominal pain and nausea.  Genitourinary: Negative for difficulty urinating, frequency and vaginal pain.  Musculoskeletal: Positive for back pain and gait problem.  Skin: Negative for pallor and rash.  Neurological: Negative for dizziness, tremors, weakness, numbness and headaches.  Psychiatric/Behavioral: Negative for confusion, sleep disturbance and suicidal ideas. The patient is nervous/anxious.     Objective:  BP 118/82 (BP Location: Right Arm, Patient Position: Sitting, Cuff Size: Normal)   Pulse 89   Temp 98.4 F (36.9 C) (  Oral)   Ht 5\' 5"  (1.651 m)   Wt 109 lb (49.4 kg)   SpO2 98%   BMI 18.14 kg/m   BP Readings from Last 3 Encounters:  06/24/20 118/82  06/07/20 124/79  03/20/20 102/60    Wt Readings from Last 3 Encounters:  06/24/20 109 lb (49.4 kg)  03/20/20 119 lb 6 oz (54.1 kg)  02/07/20 121 lb (54.9 kg)    Physical Exam Constitutional:      General: She is not in acute distress.    Appearance: She is well-developed.  HENT:     Head: Normocephalic.     Right Ear: External ear normal.     Left Ear:  External ear normal.     Nose: Nose normal.  Eyes:     General:        Right eye: No discharge.        Left eye: No discharge.     Conjunctiva/sclera: Conjunctivae normal.     Pupils: Pupils are equal, round, and reactive to light.  Neck:     Thyroid: No thyromegaly.     Vascular: No JVD.     Trachea: No tracheal deviation.  Cardiovascular:     Rate and Rhythm: Normal rate and regular rhythm.     Heart sounds: Normal heart sounds.  Pulmonary:     Effort: No respiratory distress.     Breath sounds: No stridor. No wheezing.  Abdominal:     General: Bowel sounds are normal. There is no distension.     Palpations: Abdomen is soft. There is no mass.     Tenderness: There is no abdominal tenderness. There is no guarding or rebound.  Musculoskeletal:        General: No tenderness.     Cervical back: Normal range of motion and neck supple.  Lymphadenopathy:     Cervical: No cervical adenopathy.  Skin:    Findings: No erythema or rash.  Neurological:     Cranial Nerves: No cranial nerve deficit.     Motor: No abnormal muscle tone.     Coordination: Coordination normal.     Gait: Gait normal.     Deep Tendon Reflexes: Reflexes normal.  Psychiatric:        Behavior: Behavior normal.        Thought Content: Thought content normal.        Judgment: Judgment normal.     Lab Results  Component Value Date   WBC 5.4 02/07/2020   HGB 11.4 (L) 02/07/2020   HCT 34.9 (L) 02/07/2020   PLT 339.0 02/07/2020   GLUCOSE 75 02/07/2020   CHOL 204 (H) 03/07/2019   TRIG 175.0 (H) 03/07/2019   HDL 43.10 03/07/2019   LDLDIRECT 135.0 10/27/2018   LDLCALC 126 (H) 03/07/2019   ALT 9 02/07/2020   AST 13 02/07/2020   NA 142 02/07/2020   K 3.5 02/07/2020   CL 107 02/07/2020   CREATININE 1.09 02/07/2020   BUN 13 02/07/2020   CO2 27 02/07/2020   TSH 0.63 02/07/2020    No results found.  Assessment & Plan:

## 2020-06-24 NOTE — Assessment & Plan Note (Signed)
On B12 

## 2020-06-24 NOTE — Assessment & Plan Note (Signed)
No relapse 

## 2020-06-24 NOTE — Assessment & Plan Note (Signed)
Xanax prn  Potential benefits of a long term benzodiazepines  use as well as potential risks  and complications were explained to the patient and were aknowledged. 

## 2020-06-24 NOTE — Assessment & Plan Note (Signed)
Rare Fioricet use  Potential benefits of a long term Fioricet use as well as potential risks  and complications were explained to the patient and were aknowledged.

## 2020-07-02 ENCOUNTER — Emergency Department (HOSPITAL_COMMUNITY): Payer: BC Managed Care – PPO

## 2020-07-02 ENCOUNTER — Other Ambulatory Visit: Payer: Self-pay

## 2020-07-02 ENCOUNTER — Emergency Department (HOSPITAL_COMMUNITY)
Admission: EM | Admit: 2020-07-02 | Discharge: 2020-07-02 | Disposition: A | Discharge status: Home / Self Care | Payer: BC Managed Care – PPO | Attending: Emergency Medicine | Admitting: Emergency Medicine

## 2020-07-02 DIAGNOSIS — S39012A Strain of muscle, fascia and tendon of lower back, initial encounter: Secondary | ICD-10-CM

## 2020-07-02 DIAGNOSIS — R519 Headache, unspecified: Secondary | ICD-10-CM | POA: Diagnosis present

## 2020-07-02 DIAGNOSIS — S161XXA Strain of muscle, fascia and tendon at neck level, initial encounter: Secondary | ICD-10-CM | POA: Diagnosis not present

## 2020-07-02 DIAGNOSIS — Y93I9 Activity, other involving external motion: Secondary | ICD-10-CM | POA: Diagnosis not present

## 2020-07-02 DIAGNOSIS — Z96612 Presence of left artificial shoulder joint: Secondary | ICD-10-CM | POA: Insufficient documentation

## 2020-07-02 DIAGNOSIS — Y92410 Unspecified street and highway as the place of occurrence of the external cause: Secondary | ICD-10-CM | POA: Insufficient documentation

## 2020-07-02 DIAGNOSIS — S8010XA Contusion of unspecified lower leg, initial encounter: Secondary | ICD-10-CM | POA: Diagnosis not present

## 2020-07-02 DIAGNOSIS — F17210 Nicotine dependence, cigarettes, uncomplicated: Secondary | ICD-10-CM | POA: Diagnosis not present

## 2020-07-02 LAB — CBC
HCT: 43.5 % (ref 36.0–46.0)
Hemoglobin: 13.6 g/dL (ref 12.0–15.0)
MCH: 30.4 pg (ref 26.0–34.0)
MCHC: 31.3 g/dL (ref 30.0–36.0)
MCV: 97.1 fL (ref 80.0–100.0)
Platelets: 333 10*3/uL (ref 150–400)
RBC: 4.48 MIL/uL (ref 3.87–5.11)
RDW: 14.6 % (ref 11.5–15.5)
WBC: 6.5 10*3/uL (ref 4.0–10.5)
nRBC: 0 % (ref 0.0–0.2)

## 2020-07-02 LAB — COMPREHENSIVE METABOLIC PANEL
ALT: 16 U/L (ref 0–44)
AST: 25 U/L (ref 15–41)
Albumin: 3.7 g/dL (ref 3.5–5.0)
Alkaline Phosphatase: 113 U/L (ref 38–126)
Anion gap: 9 (ref 5–15)
BUN: 10 mg/dL (ref 6–20)
CO2: 25 mmol/L (ref 22–32)
Calcium: 9.7 mg/dL (ref 8.9–10.3)
Chloride: 106 mmol/L (ref 98–111)
Creatinine, Ser: 1.14 mg/dL — ABNORMAL HIGH (ref 0.44–1.00)
GFR calc Af Amer: >60 mL/min (ref >60)
GFR calc non Af Amer: 56 mL/min — ABNORMAL LOW (ref >60)
Glucose, Bld: 83 mg/dL (ref 70–99)
Potassium: 3.9 mmol/L (ref 3.5–5.1)
Sodium: 140 mmol/L (ref 135–145)
Total Bilirubin: 0.5 mg/dL (ref 0.3–1.2)
Total Protein: 7.5 g/dL (ref 6.5–8.1)

## 2020-07-02 LAB — ETHANOL: Alcohol, Ethyl (B): <10 mg/dL (ref <10)

## 2020-07-02 LAB — LACTIC ACID, PLASMA: Lactic Acid, Venous: 1 mmol/L (ref 0.5–1.9)

## 2020-07-02 MED ORDER — MORPHINE SULFATE (PF) 4 MG/ML IV SOLN
4.0000 mg | Freq: Once | INTRAVENOUS | Status: AC
  Administered 2020-07-02: 4 mg via INTRAVENOUS
  Filled 2020-07-02: qty 1

## 2020-07-02 MED ORDER — METHOCARBAMOL 500 MG PO TABS: 500.0000 mg | ORAL_TABLET | Freq: Two times a day (BID) | ORAL | 0 refills | Status: DC

## 2020-07-02 MED ORDER — SODIUM CHLORIDE 0.9 % IV BOLUS
1000.0000 mL | Freq: Once | INTRAVENOUS | Status: AC
  Administered 2020-07-02: 1000 mL via INTRAVENOUS

## 2020-07-02 MED ORDER — ONDANSETRON HCL 4 MG/2ML IJ SOLN
4.0000 mg | Freq: Once | INTRAMUSCULAR | Status: AC
  Administered 2020-07-02: 4 mg via INTRAVENOUS
  Filled 2020-07-02: qty 2

## 2020-07-02 MED ORDER — HYDROCODONE-ACETAMINOPHEN 5-325 MG PO TABS: 1.0000 | ORAL_TABLET | ORAL | 0 refills | Status: DC | PRN
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
## 2020-07-02 NOTE — ED Notes (Signed)
Pt returned from CT in stable condition

## 2020-07-02 NOTE — ED Notes (Signed)
Pt ambulated up the hallway well without assistance. No obvious signs of pain. Pt got in and out of bed alone with no assistance and no visible signs of pain.

## 2020-08-13 ENCOUNTER — Other Ambulatory Visit: Payer: Self-pay | Admitting: Internal Medicine

## 2020-09-24 ENCOUNTER — Other Ambulatory Visit: Payer: Self-pay | Admitting: Internal Medicine

## 2020-09-24 ENCOUNTER — Ambulatory Visit: Payer: BC Managed Care – PPO | Admitting: Internal Medicine

## 2020-09-27 ENCOUNTER — Other Ambulatory Visit: Payer: Self-pay | Admitting: Internal Medicine

## 2020-10-15 ENCOUNTER — Other Ambulatory Visit: Payer: Self-pay

## 2020-10-16 ENCOUNTER — Ambulatory Visit: Payer: Self-pay | Admitting: Internal Medicine

## 2020-10-24 ENCOUNTER — Other Ambulatory Visit: Payer: Self-pay

## 2020-10-25 ENCOUNTER — Other Ambulatory Visit: Payer: Self-pay | Admitting: Internal Medicine

## 2020-10-25 ENCOUNTER — Ambulatory Visit: Payer: Self-pay | Admitting: Internal Medicine

## 2020-10-29 ENCOUNTER — Other Ambulatory Visit: Payer: Self-pay

## 2020-10-30 ENCOUNTER — Ambulatory Visit (INDEPENDENT_AMBULATORY_CARE_PROVIDER_SITE_OTHER): Payer: BLUE CROSS/BLUE SHIELD | Admitting: Internal Medicine

## 2020-10-30 ENCOUNTER — Encounter: Payer: Self-pay | Admitting: Internal Medicine

## 2020-10-30 DIAGNOSIS — F411 Generalized anxiety disorder: Secondary | ICD-10-CM

## 2020-10-30 DIAGNOSIS — F332 Major depressive disorder, recurrent severe without psychotic features: Secondary | ICD-10-CM

## 2020-10-30 DIAGNOSIS — R109 Unspecified abdominal pain: Secondary | ICD-10-CM | POA: Diagnosis not present

## 2020-10-30 LAB — CBC WITH DIFFERENTIAL/PLATELET
Basophils Absolute: 0.1 10*3/uL (ref 0.0–0.1)
Basophils Relative: 0.8 % (ref 0.0–3.0)
Eosinophils Absolute: 0 10*3/uL (ref 0.0–0.7)
Eosinophils Relative: 0.4 % (ref 0.0–5.0)
HCT: 43.4 % (ref 36.0–46.0)
Hemoglobin: 14.5 g/dL (ref 12.0–15.0)
Lymphocytes Relative: 47.6 % — ABNORMAL HIGH (ref 12.0–46.0)
Lymphs Abs: 4.3 10*3/uL — ABNORMAL HIGH (ref 0.7–4.0)
MCHC: 33.3 g/dL (ref 30.0–36.0)
MCV: 95.2 fl (ref 78.0–100.0)
Monocytes Absolute: 0.8 10*3/uL (ref 0.1–1.0)
Monocytes Relative: 9.2 % (ref 3.0–12.0)
Neutro Abs: 3.8 10*3/uL (ref 1.4–7.7)
Neutrophils Relative %: 42 % — ABNORMAL LOW (ref 43.0–77.0)
Platelets: 351 10*3/uL (ref 150.0–400.0)
RBC: 4.56 Mil/uL (ref 3.87–5.11)
RDW: 13.5 % (ref 11.5–15.5)
WBC: 9.1 10*3/uL (ref 4.0–10.5)

## 2020-10-30 LAB — COMPREHENSIVE METABOLIC PANEL
ALT: 9 U/L (ref 0–35)
AST: 15 U/L (ref 0–37)
Albumin: 4.3 g/dL (ref 3.5–5.2)
Alkaline Phosphatase: 107 U/L (ref 39–117)
BUN: 7 mg/dL (ref 6–23)
CO2: 31 mEq/L (ref 19–32)
Calcium: 10.3 mg/dL (ref 8.4–10.5)
Chloride: 102 mEq/L (ref 96–112)
Creatinine, Ser: 1.19 mg/dL (ref 0.40–1.20)
GFR: 53.56 mL/min — ABNORMAL LOW (ref >60.00)
Glucose, Bld: 72 mg/dL (ref 70–99)
Potassium: 4.7 mEq/L (ref 3.5–5.1)
Sodium: 139 mEq/L (ref 135–145)
Total Bilirubin: 0.3 mg/dL (ref 0.2–1.2)
Total Protein: 7.8 g/dL (ref 6.0–8.3)

## 2020-10-30 MED ORDER — FUROSEMIDE 20 MG PO TABS: 20.0000 mg | ORAL_TABLET | Freq: Every day | ORAL | 5 refills | Status: DC

## 2020-10-30 MED ORDER — GABAPENTIN 300 MG PO CAPS: ORAL_CAPSULE | ORAL | 5 refills | Status: DC

## 2020-10-30 MED ORDER — BUTALBITAL-APAP-CAFFEINE 50-325-40 MG PO TABS: 1.0000 | ORAL_TABLET | Freq: Three times a day (TID) | ORAL | 0 refills | Status: DC | PRN

## 2020-10-30 MED ORDER — OMEPRAZOLE 40 MG PO CPDR: 40.0000 mg | DELAYED_RELEASE_CAPSULE | Freq: Every day | ORAL | 5 refills | Status: DC

## 2020-10-30 MED ORDER — CYCLOBENZAPRINE HCL 10 MG PO TABS: ORAL_TABLET | ORAL | 5 refills | Status: DC

## 2020-10-30 MED ORDER — TOPIRAMATE 100 MG PO TABS: 100.0000 mg | ORAL_TABLET | Freq: Every day | ORAL | 5 refills | Status: DC

## 2020-10-30 MED ORDER — BUSPIRONE HCL 10 MG PO TABS: 10.0000 mg | ORAL_TABLET | Freq: Two times a day (BID) | ORAL | 11 refills | Status: DC

## 2020-10-30 NOTE — Addendum Note (Signed)
Addended by: Raliegh Ip on: 10/30/2020 02:58 PM   Modules accepted: Orders

## 2020-10-30 NOTE — Assessment & Plan Note (Addendum)
Dr Hardin Negus wanted the pt to be off Xanax - wean off. Buspar to try
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
## 2020-10-30 NOTE — Assessment & Plan Note (Signed)
On Remeron, Effexor

## 2020-10-31 ENCOUNTER — Other Ambulatory Visit: Payer: Self-pay | Admitting: Internal Medicine

## 2020-10-31 ENCOUNTER — Emergency Department (HOSPITAL_COMMUNITY)
Admission: EM | Admit: 2020-10-31 | Discharge: 2020-11-01 | Disposition: A | Discharge status: Home / Self Care | Payer: BLUE CROSS/BLUE SHIELD | Attending: Emergency Medicine | Admitting: Emergency Medicine

## 2020-10-31 ENCOUNTER — Encounter (HOSPITAL_COMMUNITY): Payer: Self-pay | Admitting: Emergency Medicine

## 2020-10-31 DIAGNOSIS — F17210 Nicotine dependence, cigarettes, uncomplicated: Secondary | ICD-10-CM | POA: Diagnosis not present

## 2020-10-31 DIAGNOSIS — Y92410 Unspecified street and highway as the place of occurrence of the external cause: Secondary | ICD-10-CM | POA: Diagnosis not present

## 2020-10-31 DIAGNOSIS — M25511 Pain in right shoulder: Secondary | ICD-10-CM | POA: Insufficient documentation

## 2020-10-31 DIAGNOSIS — R1010 Upper abdominal pain, unspecified: Secondary | ICD-10-CM | POA: Diagnosis not present

## 2020-10-31 DIAGNOSIS — K219 Gastro-esophageal reflux disease without esophagitis: Secondary | ICD-10-CM | POA: Diagnosis not present

## 2020-10-31 DIAGNOSIS — M25512 Pain in left shoulder: Secondary | ICD-10-CM | POA: Insufficient documentation

## 2020-10-31 DIAGNOSIS — M545 Low back pain, unspecified: Secondary | ICD-10-CM | POA: Insufficient documentation

## 2020-10-31 DIAGNOSIS — M542 Cervicalgia: Secondary | ICD-10-CM | POA: Diagnosis not present

## 2020-10-31 DIAGNOSIS — R519 Headache, unspecified: Secondary | ICD-10-CM | POA: Diagnosis not present

## 2020-10-31 DIAGNOSIS — R079 Chest pain, unspecified: Secondary | ICD-10-CM | POA: Insufficient documentation

## 2020-10-31 DIAGNOSIS — S2241XA Multiple fractures of ribs, right side, initial encounter for closed fracture: Secondary | ICD-10-CM

## 2020-10-31 DIAGNOSIS — S2222XA Fracture of body of sternum, initial encounter for closed fracture: Secondary | ICD-10-CM

## 2020-10-31 LAB — URINALYSIS, ROUTINE W REFLEX MICROSCOPIC
Bilirubin Urine: NEGATIVE
Hgb urine dipstick: NEGATIVE
Ketones, ur: NEGATIVE
Nitrite: POSITIVE — AB
RBC / HPF: NONE SEEN (ref 0–?)
Specific Gravity, Urine: 1.01 (ref 1.000–1.030)
Total Protein, Urine: NEGATIVE
Urine Glucose: NEGATIVE
Urobilinogen, UA: 0.2 (ref 0.0–1.0)
pH: 5.5 (ref 5.0–8.0)

## 2020-10-31 MED ORDER — CEPHALEXIN 500 MG PO CAPS: 500.0000 mg | ORAL_CAPSULE | Freq: Four times a day (QID) | ORAL | 0 refills | Status: DC
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
## 2020-10-31 NOTE — ED Notes (Signed)
Received pt at this time, ambulatory with steady gait.

## 2020-11-01 ENCOUNTER — Emergency Department (HOSPITAL_COMMUNITY): Payer: BLUE CROSS/BLUE SHIELD

## 2020-11-01 ENCOUNTER — Telehealth: Payer: Self-pay | Admitting: *Deleted

## 2020-11-01 LAB — COMPREHENSIVE METABOLIC PANEL
ALT: 17 U/L (ref 0–44)
AST: 24 U/L (ref 15–41)
Albumin: 3.4 g/dL — ABNORMAL LOW (ref 3.5–5.0)
Alkaline Phosphatase: 104 U/L (ref 38–126)
Anion gap: 11 (ref 5–15)
BUN: 11 mg/dL (ref 6–20)
CO2: 23 mmol/L (ref 22–32)
Calcium: 9.6 mg/dL (ref 8.9–10.3)
Chloride: 105 mmol/L (ref 98–111)
Creatinine, Ser: 1.05 mg/dL — ABNORMAL HIGH (ref 0.44–1.00)
GFR, Estimated: >60 mL/min (ref >60)
Glucose, Bld: 93 mg/dL (ref 70–99)
Potassium: 3.9 mmol/L (ref 3.5–5.1)
Sodium: 139 mmol/L (ref 135–145)
Total Bilirubin: 0.3 mg/dL (ref 0.3–1.2)
Total Protein: 7 g/dL (ref 6.5–8.1)

## 2020-11-01 LAB — CBC
HCT: 40.8 % (ref 36.0–46.0)
Hemoglobin: 13.8 g/dL (ref 12.0–15.0)
MCH: 32.5 pg (ref 26.0–34.0)
MCHC: 33.8 g/dL (ref 30.0–36.0)
MCV: 96.2 fL (ref 80.0–100.0)
Platelets: 305 10*3/uL (ref 150–400)
RBC: 4.24 MIL/uL (ref 3.87–5.11)
RDW: 13.1 % (ref 11.5–15.5)
WBC: 10.1 10*3/uL (ref 4.0–10.5)
nRBC: 0 % (ref 0.0–0.2)

## 2020-11-01 LAB — I-STAT BETA HCG BLOOD, ED (MC, WL, AP ONLY): I-stat hCG, quantitative: <5 m[IU]/mL (ref <5)

## 2020-11-01 MED ORDER — FENTANYL CITRATE (PF) 100 MCG/2ML IJ SOLN
50.0000 ug | Freq: Once | INTRAMUSCULAR | Status: AC
  Administered 2020-11-01: 50 ug via INTRAVENOUS
  Filled 2020-11-01: qty 2

## 2020-11-01 MED ORDER — OXYCODONE-ACETAMINOPHEN 5-325 MG PO TABS
1.0000 | ORAL_TABLET | Freq: Once | ORAL | Status: AC
  Administered 2020-11-01: 1 via ORAL
  Filled 2020-11-01: qty 1

## 2020-11-01 MED ORDER — IOHEXOL 300 MG/ML  SOLN
100.0000 mL | Freq: Once | INTRAMUSCULAR | Status: AC | PRN
  Administered 2020-11-01: 100 mL via INTRAVENOUS

## 2020-11-01 MED ORDER — HYDROCODONE-ACETAMINOPHEN 5-325 MG PO TABS: 1.0000 | ORAL_TABLET | Freq: Four times a day (QID) | ORAL | 0 refills | Status: DC | PRN

## 2020-11-01 NOTE — Telephone Encounter (Signed)
Rec'd PA for Pantoprazole completed via cover-my-med w/ Key: [id number] - Rx #: [id number]. Waiting on approval status from insurance...Vanessa Payne
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
## 2020-11-01 NOTE — ED Notes (Signed)
Patient transported to CT 

## 2020-11-01 NOTE — ED Notes (Signed)
Patient transported to X-ray 
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
## 2020-11-05 NOTE — Telephone Encounter (Signed)
Rec'd PA back w/status. Med was denied. It states pt must have tried and failed two over the counter proton pump inhibitors such as omeprazole, esomeprazole. or lansoprazole. In this case pt has only tried omeprazole.Marland KitchenJohny Chess

## 2020-11-06 NOTE — Telephone Encounter (Signed)
Notified pt w/MD response. Pt states she is taking the Omeprazole prescription. It is covered and cheaper w/her insurance.Marland KitchenJohny Chess

## 2020-11-06 NOTE — Telephone Encounter (Signed)
Please asked the patient to try over-the-counter Nexium.  Thanks

## 2020-11-08 ENCOUNTER — Telehealth: Payer: Self-pay | Admitting: Internal Medicine
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
## 2020-11-10 NOTE — Telephone Encounter (Signed)
Pain medicine was provided by the emergency room doctor.  Keep review office visit.

## 2020-11-13 ENCOUNTER — Ambulatory Visit: Payer: BLUE CROSS/BLUE SHIELD | Admitting: Internal Medicine

## 2020-11-20 ENCOUNTER — Other Ambulatory Visit: Payer: Self-pay | Admitting: Internal Medicine

## 2020-11-23 ENCOUNTER — Other Ambulatory Visit: Payer: Self-pay | Admitting: Internal Medicine

## 2020-12-20 ENCOUNTER — Other Ambulatory Visit: Payer: Self-pay | Admitting: Internal Medicine

## 2020-12-20 NOTE — Telephone Encounter (Signed)
MD is out of the office until 01/06/21 pls advise on refill.Marland KitchenJohny Chess

## 2020-12-23 NOTE — Telephone Encounter (Signed)
I would recommend this wait for PCP as she is concurrently on hydrocodone and this should not also be filled. Last PCP note from last year states she takes this very rarely.

## 2021-01-21 ENCOUNTER — Other Ambulatory Visit: Payer: Self-pay | Admitting: Internal Medicine

## 2021-01-23 NOTE — Telephone Encounter (Signed)
Patient calling checking the status of medication

## 2021-01-28 ENCOUNTER — Encounter: Payer: Self-pay | Admitting: Internal Medicine

## 2021-01-28 ENCOUNTER — Ambulatory Visit (INDEPENDENT_AMBULATORY_CARE_PROVIDER_SITE_OTHER): Payer: 59 | Admitting: Internal Medicine

## 2021-01-28 ENCOUNTER — Other Ambulatory Visit: Payer: Self-pay

## 2021-01-28 VITALS — BP 130/82 | HR 98 | Temp 97.7°F | Ht 65.0 in | Wt 124.2 lb

## 2021-01-28 DIAGNOSIS — M79604 Pain in right leg: Secondary | ICD-10-CM | POA: Diagnosis not present

## 2021-01-28 DIAGNOSIS — M545 Low back pain, unspecified: Secondary | ICD-10-CM

## 2021-01-28 DIAGNOSIS — E538 Deficiency of other specified B group vitamins: Secondary | ICD-10-CM | POA: Diagnosis not present

## 2021-01-28 DIAGNOSIS — Z1211 Encounter for screening for malignant neoplasm of colon: Secondary | ICD-10-CM

## 2021-01-28 DIAGNOSIS — G43109 Migraine with aura, not intractable, without status migrainosus: Secondary | ICD-10-CM

## 2021-01-28 DIAGNOSIS — G8929 Other chronic pain: Secondary | ICD-10-CM

## 2021-01-28 MED ORDER — MIRTAZAPINE 30 MG PO TABS: ORAL_TABLET | ORAL | 5 refills | Status: DC

## 2021-01-28 MED ORDER — MELOXICAM 15 MG PO TABS: ORAL_TABLET | ORAL | 5 refills | Status: DC

## 2021-01-28 MED ORDER — BUTALBITAL-APAP-CAFFEINE 50-325-40 MG PO TABS: ORAL_TABLET | ORAL | 1 refills | Status: DC

## 2021-01-28 MED ORDER — FUROSEMIDE 20 MG PO TABS: 20.0000 mg | ORAL_TABLET | Freq: Every day | ORAL | 5 refills | Status: DC

## 2021-01-28 MED ORDER — OMEPRAZOLE 40 MG PO CPDR: 40.0000 mg | DELAYED_RELEASE_CAPSULE | Freq: Every day | ORAL | 5 refills | Status: DC

## 2021-01-28 MED ORDER — ALBUTEROL SULFATE HFA 108 (90 BASE) MCG/ACT IN AERS: 2.0000 | INHALATION_SPRAY | Freq: Four times a day (QID) | RESPIRATORY_TRACT | 6 refills | Status: DC | PRN

## 2021-01-28 MED ORDER — CYANOCOBALAMIN 1000 MCG/ML IJ SOLN: 1000.0000 ug | INTRAMUSCULAR | 11 refills | Status: DC

## 2021-01-28 MED ORDER — ALPRAZOLAM 0.25 MG PO TABS: 0.2500 mg | ORAL_TABLET | Freq: Two times a day (BID) | ORAL | 1 refills | Status: DC | PRN

## 2021-01-28 MED ORDER — VENLAFAXINE HCL ER 150 MG PO CP24: 150.0000 mg | ORAL_CAPSULE | Freq: Every day | ORAL | 5 refills | Status: DC

## 2021-01-28 MED ORDER — GABAPENTIN 300 MG PO CAPS: ORAL_CAPSULE | ORAL | 5 refills | Status: DC

## 2021-01-28 NOTE — Assessment & Plan Note (Signed)
On Gabapentin, Fioricet Pt stopped seeing Dr Hardin Negus
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
## 2021-01-28 NOTE — Assessment & Plan Note (Signed)
Resolved

## 2021-01-28 NOTE — Assessment & Plan Note (Signed)
On B12 inj 

## 2021-01-29 ENCOUNTER — Encounter: Payer: Self-pay | Admitting: Internal Medicine

## 2021-01-29 NOTE — Assessment & Plan Note (Signed)
Fioricet as needed - use as little as possible.

## 2021-01-29 NOTE — Assessment & Plan Note (Signed)
Meloxicam prn 

## 2021-03-24 ENCOUNTER — Other Ambulatory Visit: Payer: Self-pay | Admitting: Internal Medicine

## 2021-03-26 NOTE — Telephone Encounter (Signed)
   Patient calling for status of refill butalbital-acetaminophen-caffeine (FIORICET) 50-325-40 MG tablet

## 2021-04-22 ENCOUNTER — Other Ambulatory Visit: Payer: Self-pay | Admitting: Internal Medicine

## 2021-04-25 NOTE — Telephone Encounter (Signed)
  Patient is requesting a refill for  ALPRAZolam (XANAX) 0.25 MG tablet butalbital-acetaminophen-caffeine (FIORICET) 50-325-40 MG tablet  [id number] - [place], [place] - [place]

## 2021-05-25 ENCOUNTER — Other Ambulatory Visit: Payer: Self-pay | Admitting: Internal Medicine

## 2021-05-29 ENCOUNTER — Ambulatory Visit (INDEPENDENT_AMBULATORY_CARE_PROVIDER_SITE_OTHER): Payer: 59 | Admitting: Internal Medicine

## 2021-05-29 ENCOUNTER — Encounter: Payer: Self-pay | Admitting: Internal Medicine

## 2021-05-29 ENCOUNTER — Other Ambulatory Visit: Payer: Self-pay

## 2021-05-29 DIAGNOSIS — G4700 Insomnia, unspecified: Secondary | ICD-10-CM

## 2021-05-29 DIAGNOSIS — F332 Major depressive disorder, recurrent severe without psychotic features: Secondary | ICD-10-CM

## 2021-05-29 DIAGNOSIS — G8929 Other chronic pain: Secondary | ICD-10-CM

## 2021-05-29 DIAGNOSIS — M545 Low back pain, unspecified: Secondary | ICD-10-CM | POA: Diagnosis not present

## 2021-05-29 DIAGNOSIS — E538 Deficiency of other specified B group vitamins: Secondary | ICD-10-CM

## 2021-05-29 DIAGNOSIS — G43109 Migraine with aura, not intractable, without status migrainosus: Secondary | ICD-10-CM | POA: Diagnosis not present

## 2021-05-29 DIAGNOSIS — G47429 Narcolepsy in conditions classified elsewhere without cataplexy: Secondary | ICD-10-CM

## 2021-05-29 MED ORDER — ALPRAZOLAM 0.25 MG PO TABS: 0.2500 mg | ORAL_TABLET | Freq: Two times a day (BID) | ORAL | 1 refills | Status: DC | PRN

## 2021-05-29 MED ORDER — TOPIRAMATE 100 MG PO TABS: 100.0000 mg | ORAL_TABLET | Freq: Every day | ORAL | 5 refills | Status: DC

## 2021-05-29 MED ORDER — ALBUTEROL SULFATE HFA 108 (90 BASE) MCG/ACT IN AERS: 2.0000 | INHALATION_SPRAY | Freq: Four times a day (QID) | RESPIRATORY_TRACT | 11 refills | Status: DC | PRN

## 2021-05-29 MED ORDER — ZOLPIDEM TARTRATE 5 MG PO TABS: 5.0000 mg | ORAL_TABLET | Freq: Every evening | ORAL | 0 refills | Status: DC | PRN

## 2021-05-29 MED ORDER — MELOXICAM 15 MG PO TABS: ORAL_TABLET | ORAL | 5 refills | Status: DC

## 2021-05-29 MED ORDER — CYCLOBENZAPRINE HCL 10 MG PO TABS: ORAL_TABLET | ORAL | 5 refills | Status: DC

## 2021-05-29 MED ORDER — BUTALBITAL-APAP-CAFFEINE 50-325-40 MG PO TABS: ORAL_TABLET | ORAL | 1 refills | Status: DC

## 2021-05-29 MED ORDER — GABAPENTIN 300 MG PO CAPS: ORAL_CAPSULE | ORAL | 5 refills | Status: DC

## 2021-05-29 MED ORDER — VENLAFAXINE HCL ER 150 MG PO CP24: 150.0000 mg | ORAL_CAPSULE | Freq: Every day | ORAL | 5 refills | Status: DC

## 2021-05-29 MED ORDER — MIRTAZAPINE 30 MG PO TABS: 15.0000 mg | ORAL_TABLET | Freq: Every day | ORAL | 5 refills | Status: DC

## 2021-05-29 NOTE — Assessment & Plan Note (Signed)
Cont w/Vit B12 

## 2021-05-29 NOTE — Assessment & Plan Note (Signed)
On Tylenol prn Not seeing Dr Hardin Negus - health ins is not covering
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
## 2021-05-29 NOTE — Patient Instructions (Signed)
TENS Unit Muscle Stimulator Electric Shock Therapy for Muscles Dual

## 2021-06-02 NOTE — Assessment & Plan Note (Signed)
Continue with mirtazapine and Effexor.  No change

## 2021-06-02 NOTE — Assessment & Plan Note (Signed)
Unable to sleep.  She needs Ambien.  Prescribed 5 mg to use as a rare as possible.  Interactions between zolpidem and other meds were discussed again.  Potential benefits of a long term benzodiazepines  use as well as potential risks  and complications were explained to the patient and were aknowledged.

## 2021-06-02 NOTE — Assessment & Plan Note (Signed)
Fioricet is the only thing that helps w/HA. Not to be used at work, when driving etc No somnolence.  Potential benefits of a long term Fioricet use as well as potential risks  and complications were explained to the patient and were aknowledged.

## 2021-06-02 NOTE — Assessment & Plan Note (Signed)
Resolved

## 2021-07-02 ENCOUNTER — Telehealth: Payer: Self-pay

## 2021-07-02 NOTE — Telephone Encounter (Signed)
Type of form received [facility]  Form placed in Provider mailbox  Contact the patient once form is completed. [phone]   Things to remember: [place] office: If form received in person, remind patient that forms take 7-10 business days CMA should attach charge sheet and put on Supervisor's desk

## 2021-07-02 NOTE — Telephone Encounter (Signed)
Place on MD desk in yellow folder in purple order's folder.Marland KitchenJohny Payne

## 2021-07-03 NOTE — Telephone Encounter (Signed)
Done. Thanks.

## 2021-07-03 NOTE — Telephone Encounter (Signed)
Notified pt form is ready for pick-up../lmb 

## 2021-07-21 ENCOUNTER — Other Ambulatory Visit: Payer: Self-pay | Admitting: Internal Medicine

## 2021-07-25 ENCOUNTER — Other Ambulatory Visit: Payer: Self-pay | Admitting: Internal Medicine

## 2021-08-28 ENCOUNTER — Ambulatory Visit (INDEPENDENT_AMBULATORY_CARE_PROVIDER_SITE_OTHER): Payer: 59 | Admitting: Internal Medicine

## 2021-08-28 ENCOUNTER — Other Ambulatory Visit: Payer: Self-pay

## 2021-08-28 ENCOUNTER — Encounter: Payer: Self-pay | Admitting: Internal Medicine

## 2021-08-28 DIAGNOSIS — Z23 Encounter for immunization: Secondary | ICD-10-CM

## 2021-08-28 DIAGNOSIS — F411 Generalized anxiety disorder: Secondary | ICD-10-CM

## 2021-08-28 DIAGNOSIS — F332 Major depressive disorder, recurrent severe without psychotic features: Secondary | ICD-10-CM | POA: Diagnosis not present

## 2021-08-28 DIAGNOSIS — G43109 Migraine with aura, not intractable, without status migrainosus: Secondary | ICD-10-CM | POA: Diagnosis not present

## 2021-08-28 DIAGNOSIS — E538 Deficiency of other specified B group vitamins: Secondary | ICD-10-CM

## 2021-08-28 DIAGNOSIS — G47429 Narcolepsy in conditions classified elsewhere without cataplexy: Secondary | ICD-10-CM

## 2021-08-28 MED ORDER — ZOLPIDEM TARTRATE 5 MG PO TABS: ORAL_TABLET | ORAL | 2 refills | Status: DC

## 2021-08-28 MED ORDER — MIRTAZAPINE 30 MG PO TABS
15.0000 mg | ORAL_TABLET | Freq: Every day | ORAL | 5 refills | Status: DC
Start: 2021-08-28 — End: 2021-12-16

## 2021-08-28 MED ORDER — BUTALBITAL-APAP-CAFFEINE 50-325-40 MG PO TABS: ORAL_TABLET | ORAL | 1 refills | Status: DC

## 2021-08-28 MED ORDER — GABAPENTIN 300 MG PO CAPS: ORAL_CAPSULE | ORAL | 5 refills | Status: DC

## 2021-08-28 MED ORDER — VENLAFAXINE HCL ER 150 MG PO CP24: 150.0000 mg | ORAL_CAPSULE | Freq: Every day | ORAL | 5 refills | Status: DC

## 2021-08-28 MED ORDER — ALPRAZOLAM 0.25 MG PO TABS: 0.2500 mg | ORAL_TABLET | Freq: Two times a day (BID) | ORAL | 1 refills | Status: DC | PRN

## 2021-08-28 MED ORDER — CYANOCOBALAMIN 1000 MCG/ML IJ SOLN: 1000.0000 ug | INTRAMUSCULAR | 11 refills | Status: DC

## 2021-08-28 MED ORDER — ALBUTEROL SULFATE HFA 108 (90 BASE) MCG/ACT IN AERS: 2.0000 | INHALATION_SPRAY | Freq: Four times a day (QID) | RESPIRATORY_TRACT | 11 refills | Status: DC | PRN

## 2021-08-28 MED ORDER — FUROSEMIDE 20 MG PO TABS: 20.0000 mg | ORAL_TABLET | Freq: Every day | ORAL | 1 refills | Status: DC

## 2021-08-28 MED ORDER — TOPIRAMATE 100 MG PO TABS: 100.0000 mg | ORAL_TABLET | Freq: Every day | ORAL | 5 refills | Status: DC

## 2021-08-28 MED ORDER — CYCLOBENZAPRINE HCL 10 MG PO TABS: ORAL_TABLET | ORAL | 5 refills | Status: DC

## 2021-08-28 MED ORDER — OMEPRAZOLE 40 MG PO CPDR: 40.0000 mg | DELAYED_RELEASE_CAPSULE | Freq: Every day | ORAL | 5 refills | Status: DC

## 2021-08-28 MED ORDER — MELOXICAM 15 MG PO TABS: ORAL_TABLET | ORAL | 5 refills | Status: DC

## 2021-08-28 NOTE — Assessment & Plan Note (Signed)
Cont on Remeron, Effexor

## 2021-08-28 NOTE — Assessment & Plan Note (Signed)
Resolved

## 2021-08-28 NOTE — Assessment & Plan Note (Signed)
Cont w/Fioricet - the only thing that helps w/HA. Not to be used at work, when driving etc No somnolence.  Potential benefits of a long term Fioricet use as well as potential risks  and complications were explained to the patient and were aknowledged.

## 2021-08-28 NOTE — Assessment & Plan Note (Signed)
Cont on B12 shots
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
## 2021-08-28 NOTE — Assessment & Plan Note (Signed)
Potential benefits of a long term benzodiazepines  use as well as potential risks  and complications were explained to the patient and were aknowledged.  1/22 Dr Hardin Negus wanted the pt to be off Xanax - d/c. Buspar to try

## 2021-10-27 ENCOUNTER — Other Ambulatory Visit: Payer: Self-pay | Admitting: Internal Medicine

## 2021-12-01 ENCOUNTER — Ambulatory Visit: Payer: Self-pay | Admitting: Internal Medicine

## 2021-12-04 ENCOUNTER — Ambulatory Visit: Payer: 59 | Admitting: Internal Medicine

## 2021-12-16 ENCOUNTER — Ambulatory Visit: Payer: Managed Care, Other (non HMO) | Admitting: Internal Medicine

## 2021-12-16 ENCOUNTER — Encounter: Payer: Self-pay | Admitting: Internal Medicine

## 2021-12-16 ENCOUNTER — Other Ambulatory Visit: Payer: Self-pay

## 2021-12-16 DIAGNOSIS — F411 Generalized anxiety disorder: Secondary | ICD-10-CM

## 2021-12-16 DIAGNOSIS — G43109 Migraine with aura, not intractable, without status migrainosus: Secondary | ICD-10-CM

## 2021-12-16 DIAGNOSIS — F332 Major depressive disorder, recurrent severe without psychotic features: Secondary | ICD-10-CM

## 2021-12-16 DIAGNOSIS — E538 Deficiency of other specified B group vitamins: Secondary | ICD-10-CM

## 2021-12-16 MED ORDER — VENLAFAXINE HCL ER 150 MG PO CP24: 150.0000 mg | ORAL_CAPSULE | Freq: Every day | ORAL | 5 refills | Status: DC

## 2021-12-16 MED ORDER — CYCLOBENZAPRINE HCL 10 MG PO TABS: ORAL_TABLET | ORAL | 5 refills | Status: DC

## 2021-12-16 MED ORDER — ALBUTEROL SULFATE HFA 108 (90 BASE) MCG/ACT IN AERS: 2.0000 | INHALATION_SPRAY | Freq: Four times a day (QID) | RESPIRATORY_TRACT | 11 refills | Status: DC | PRN

## 2021-12-16 MED ORDER — MELOXICAM 15 MG PO TABS: ORAL_TABLET | ORAL | 5 refills | Status: DC

## 2021-12-16 MED ORDER — BUTALBITAL-APAP-CAFFEINE 50-325-40 MG PO TABS: ORAL_TABLET | ORAL | 1 refills | Status: DC

## 2021-12-16 MED ORDER — FUROSEMIDE 20 MG PO TABS
20.0000 mg | ORAL_TABLET | Freq: Every day | ORAL | 1 refills | Status: DC
Start: 2021-12-16 — End: 2022-06-29

## 2021-12-16 MED ORDER — TOPIRAMATE 100 MG PO TABS: 100.0000 mg | ORAL_TABLET | Freq: Every day | ORAL | 5 refills | Status: DC

## 2021-12-16 MED ORDER — ALPRAZOLAM 0.25 MG PO TABS: 0.2500 mg | ORAL_TABLET | Freq: Two times a day (BID) | ORAL | 1 refills | Status: DC | PRN

## 2021-12-16 MED ORDER — GABAPENTIN 300 MG PO CAPS: ORAL_CAPSULE | ORAL | 5 refills | Status: DC

## 2021-12-16 MED ORDER — MIRTAZAPINE 30 MG PO TABS: 15.0000 mg | ORAL_TABLET | Freq: Every day | ORAL | 5 refills | Status: DC

## 2021-12-16 MED ORDER — ZOLPIDEM TARTRATE 5 MG PO TABS: ORAL_TABLET | ORAL | 2 refills | Status: DC

## 2021-12-16 NOTE — Assessment & Plan Note (Signed)
Xanax prn rare use  Potential benefits of a long term benzodiazepines  use as well as potential risks  and complications were explained to the patient and were aknowledged.  

## 2021-12-16 NOTE — Assessment & Plan Note (Signed)
Cont on Remeron, Effexor ?
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
## 2021-12-16 NOTE — Assessment & Plan Note (Signed)
Worse a little ?Cont w/Fioricet - the only thing that helps w/HA. Not to be used at work, when driving etc ?No somnolence. ? Potential benefits of a long term Fioricet use as well as potential risks  and complications were explained to the patient and were aknowledged. #100 ?

## 2021-12-16 NOTE — Assessment & Plan Note (Signed)
On B12 
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
## 2022-02-23 ENCOUNTER — Other Ambulatory Visit: Payer: Self-pay

## 2022-02-23 ENCOUNTER — Other Ambulatory Visit: Payer: Self-pay | Admitting: Internal Medicine

## 2022-02-23 ENCOUNTER — Telehealth: Payer: Self-pay | Admitting: Internal Medicine

## 2022-02-23 MED ORDER — BUTALBITAL-APAP-CAFFEINE 50-325-40 MG PO TABS: ORAL_TABLET | ORAL | 0 refills | Status: DC

## 2022-02-23 NOTE — Telephone Encounter (Signed)
1.Medication Requested: ?butalbital-acetaminophen-caffeine (FIORICET) 50-325-40 MG tablet ?2. Pharmacy (Name, Street, [place]): ?[facility], [place] Phone:  [phone]  ?Fax:  [phone]  ?  ? ?3. On Med List: yes  ? ?4. Last Visit with PCP: ? ?5. Next visit date with PCP: ? ? ?Agent: Please be advised that RX refills may take up to 3 business days. We ask that you follow-up with your pharmacy.  ?

## 2022-02-25 NOTE — Telephone Encounter (Signed)
1.Medication Requested: ?butalbital-acetaminophen-caffeine (FIORICET) 50-325-40 MG tablet ?2. Pharmacy (Name, Street, [place]): ?[facility] [place], [place] Phone:  [phone]  ?Fax:  [phone]  ?  ?  ?3. On Med List: yes  ?  ?4. Last Visit with PCP: ?  ?5. Next visit date with PCP: ?  ?  ?Agent: Please be advised that RX refills may take up to 3 business days. We ask that you follow-up with your pharmacy. ? ?

## 2022-02-26 ENCOUNTER — Other Ambulatory Visit: Payer: Self-pay

## 2022-02-26 MED ORDER — BUTALBITAL-APAP-CAFFEINE 50-325-40 MG PO TABS: ORAL_TABLET | ORAL | 0 refills | Status: DC

## 2022-03-02 MED ORDER — BUTALBITAL-APAP-CAFFEINE 50-325-40 MG PO TABS: ORAL_TABLET | ORAL | 0 refills | Status: DC

## 2022-03-02 NOTE — Addendum Note (Signed)
Addended by: Cassandria Anger on: 03/02/2022 10:06 AM   Modules accepted: Orders

## 2022-03-02 NOTE — Progress Notes (Cosign Needed Addendum)
Done Thx   The firocet did not go to the pharmacy,  It was printed pt still need.Marland KitchenJohny Chess  Medical screening examination/treatment/procedure(s) were performed by non-physician practitioner and as supervising physician I was immediately available for consultation/collaboration.  I agree with above. Lew Dawes, MD

## 2022-03-02 NOTE — Addendum Note (Signed)
Addended by: Earnstine Regal on: 03/02/2022 08:47 AM   Modules accepted: Orders

## 2022-03-02 NOTE — Telephone Encounter (Signed)
Fiorcet was not sent.. Rx was on printed. Did not go to pharmacy...Vanessa Payne

## 2022-03-02 NOTE — Telephone Encounter (Signed)
1.Medication Requested: butalbital-acetaminophen-caffeine (FIORICET) 50-325-40 MG tablet 2. Pharmacy (Name, [place]): [facility] [place], [place] Phone:  [phone]  Fax:  [phone]      3. On Med List: yes    4. Last Visit with PCP:12/16/21   5. Next visit date with PCP: 03/18/22     Agent: Please be advised that RX refills may take up to 3 business days. We ask that you follow-up with your pharmacy.   Pt wants a call if there is any issues filling the Rx

## 2022-03-03 NOTE — Telephone Encounter (Signed)
Done on 5/22 Thx

## 2022-03-18 ENCOUNTER — Ambulatory Visit (INDEPENDENT_AMBULATORY_CARE_PROVIDER_SITE_OTHER): Payer: Commercial Managed Care - HMO | Admitting: Internal Medicine

## 2022-03-18 ENCOUNTER — Encounter: Payer: Self-pay | Admitting: Internal Medicine

## 2022-03-18 DIAGNOSIS — R051 Acute cough: Secondary | ICD-10-CM

## 2022-03-18 DIAGNOSIS — F332 Major depressive disorder, recurrent severe without psychotic features: Secondary | ICD-10-CM | POA: Diagnosis not present

## 2022-03-18 DIAGNOSIS — F411 Generalized anxiety disorder: Secondary | ICD-10-CM

## 2022-03-18 DIAGNOSIS — E538 Deficiency of other specified B group vitamins: Secondary | ICD-10-CM | POA: Diagnosis not present

## 2022-03-18 DIAGNOSIS — G43109 Migraine with aura, not intractable, without status migrainosus: Secondary | ICD-10-CM

## 2022-03-18 MED ORDER — BUTALBITAL-APAP-CAFFEINE 50-325-40 MG PO TABS: ORAL_TABLET | ORAL | 0 refills | Status: DC

## 2022-03-18 MED ORDER — DOXYCYCLINE HYCLATE 100 MG PO TABS: 100.0000 mg | ORAL_TABLET | Freq: Two times a day (BID) | ORAL | 0 refills | Status: DC

## 2022-03-18 MED ORDER — MELOXICAM 15 MG PO TABS: ORAL_TABLET | ORAL | 5 refills | Status: DC

## 2022-03-18 MED ORDER — VENLAFAXINE HCL ER 150 MG PO CP24: 150.0000 mg | ORAL_CAPSULE | Freq: Every day | ORAL | 5 refills | Status: DC

## 2022-03-18 MED ORDER — ZOLPIDEM TARTRATE 5 MG PO TABS
ORAL_TABLET | ORAL | 2 refills | Status: DC
Start: 2022-03-18 — End: 2022-06-01

## 2022-03-18 MED ORDER — CYCLOBENZAPRINE HCL 10 MG PO TABS
ORAL_TABLET | ORAL | 5 refills | Status: DC
Start: 2022-03-18 — End: 2022-09-25

## 2022-03-18 MED ORDER — ALPRAZOLAM 0.25 MG PO TABS: 0.2500 mg | ORAL_TABLET | Freq: Two times a day (BID) | ORAL | 1 refills | Status: DC | PRN

## 2022-03-18 NOTE — Assessment & Plan Note (Signed)
Cont w/Fioricet - the only thing that helps w/HA. Not to be used at work, when driving etc No somnolence.  Potential benefits of a long term Fioricet use as well as potential risks  and complications were explained to the patient and were aknowledged.

## 2022-03-18 NOTE — Assessment & Plan Note (Signed)
Chronic  Xanax prn - rare use  Potential benefits of a long term benzodiazepines  use as well as potential risks  and complications were explained to the patient and were aknowledged.  
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
## 2022-03-18 NOTE — Assessment & Plan Note (Signed)
Cont on B12 shots

## 2022-03-18 NOTE — Assessment & Plan Note (Signed)
Chronic stress Cont on Remeron, Effexor

## 2022-03-27 ENCOUNTER — Encounter: Payer: Self-pay | Admitting: Internal Medicine

## 2022-03-27 ENCOUNTER — Telehealth: Payer: Self-pay | Admitting: Internal Medicine
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
## 2022-03-27 NOTE — Telephone Encounter (Signed)
Okay to renew earlier if there is a police report available.  Thanks

## 2022-03-31 ENCOUNTER — Other Ambulatory Visit: Payer: Self-pay | Admitting: Internal Medicine

## 2022-04-01 ENCOUNTER — Other Ambulatory Visit: Payer: Self-pay | Admitting: Internal Medicine

## 2022-04-01 MED ORDER — ALPRAZOLAM 0.25 MG PO TABS: 0.2500 mg | ORAL_TABLET | Freq: Two times a day (BID) | ORAL | 1 refills | Status: DC | PRN

## 2022-04-01 MED ORDER — BUTALBITAL-APAP-CAFFEINE 50-325-40 MG PO TABS: ORAL_TABLET | ORAL | 0 refills | Status: DC

## 2022-04-13 ENCOUNTER — Other Ambulatory Visit: Payer: Self-pay | Admitting: Internal Medicine

## 2022-04-23 ENCOUNTER — Other Ambulatory Visit: Payer: Self-pay | Admitting: Internal Medicine

## 2022-05-23 ENCOUNTER — Other Ambulatory Visit: Payer: Self-pay | Admitting: Internal Medicine

## 2022-05-29 ENCOUNTER — Other Ambulatory Visit: Payer: Self-pay | Admitting: Internal Medicine

## 2022-06-23 ENCOUNTER — Ambulatory Visit: Payer: Commercial Managed Care - HMO | Admitting: Internal Medicine

## 2022-06-23 ENCOUNTER — Other Ambulatory Visit: Payer: Self-pay | Admitting: Internal Medicine

## 2022-06-23 ENCOUNTER — Encounter: Payer: Self-pay | Admitting: Internal Medicine

## 2022-06-23 DIAGNOSIS — J209 Acute bronchitis, unspecified: Secondary | ICD-10-CM

## 2022-06-23 DIAGNOSIS — Z23 Encounter for immunization: Secondary | ICD-10-CM

## 2022-06-23 DIAGNOSIS — G8929 Other chronic pain: Secondary | ICD-10-CM

## 2022-06-23 DIAGNOSIS — F332 Major depressive disorder, recurrent severe without psychotic features: Secondary | ICD-10-CM | POA: Diagnosis not present

## 2022-06-23 DIAGNOSIS — G43109 Migraine with aura, not intractable, without status migrainosus: Secondary | ICD-10-CM

## 2022-06-23 DIAGNOSIS — F411 Generalized anxiety disorder: Secondary | ICD-10-CM

## 2022-06-23 DIAGNOSIS — M545 Low back pain, unspecified: Secondary | ICD-10-CM

## 2022-06-23 DIAGNOSIS — R051 Acute cough: Secondary | ICD-10-CM

## 2022-06-23 DIAGNOSIS — G47429 Narcolepsy in conditions classified elsewhere without cataplexy: Secondary | ICD-10-CM

## 2022-06-23 DIAGNOSIS — E538 Deficiency of other specified B group vitamins: Secondary | ICD-10-CM

## 2022-06-23 MED ORDER — VENLAFAXINE HCL ER 150 MG PO CP24: 150.0000 mg | ORAL_CAPSULE | Freq: Every day | ORAL | 5 refills | Status: DC

## 2022-06-23 MED ORDER — TOPIRAMATE 100 MG PO TABS: 100.0000 mg | ORAL_TABLET | Freq: Every day | ORAL | 5 refills | Status: DC

## 2022-06-23 MED ORDER — MIRTAZAPINE 30 MG PO TABS: 15.0000 mg | ORAL_TABLET | Freq: Every day | ORAL | 5 refills | Status: DC

## 2022-06-23 MED ORDER — ALPRAZOLAM 0.25 MG PO TABS
0.2500 mg | ORAL_TABLET | Freq: Two times a day (BID) | ORAL | 1 refills | Status: DC | PRN
Start: 2022-06-23 — End: 2022-09-22

## 2022-06-23 MED ORDER — MELOXICAM 15 MG PO TABS
ORAL_TABLET | ORAL | 5 refills | Status: DC
Start: 2022-06-23 — End: 2022-12-23

## 2022-06-23 MED ORDER — ALBUTEROL SULFATE HFA 108 (90 BASE) MCG/ACT IN AERS
2.0000 | INHALATION_SPRAY | Freq: Four times a day (QID) | RESPIRATORY_TRACT | 11 refills | Status: DC | PRN
Start: 2022-06-23 — End: 2022-09-22

## 2022-06-23 MED ORDER — OMEPRAZOLE 40 MG PO CPDR: 40.0000 mg | DELAYED_RELEASE_CAPSULE | Freq: Every day | ORAL | 5 refills | Status: DC

## 2022-06-23 MED ORDER — CEFDINIR 300 MG PO CAPS: 300.0000 mg | ORAL_CAPSULE | Freq: Two times a day (BID) | ORAL | 0 refills | Status: DC

## 2022-06-23 MED ORDER — BUTALBITAL-APAP-CAFFEINE 50-325-40 MG PO TABS
ORAL_TABLET | ORAL | 1 refills | Status: DC
Start: 2022-06-23 — End: 2022-09-01

## 2022-06-23 MED ORDER — ZOLPIDEM TARTRATE 5 MG PO TABS: ORAL_TABLET | ORAL | 2 refills | Status: DC

## 2022-06-23 MED ORDER — GABAPENTIN 300 MG PO CAPS: ORAL_CAPSULE | ORAL | 5 refills | Status: DC

## 2022-06-23 NOTE — Assessment & Plan Note (Signed)
On B12 inj 

## 2022-06-23 NOTE — Assessment & Plan Note (Signed)
Doxy x 10 d 
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
## 2022-06-23 NOTE — Assessment & Plan Note (Signed)
Cont w/Fioricet - the only thing that helps w/HA. Not to be used at work, when driving etc  Potential benefits of a long term Fioricet use as well as potential risks  and complications were explained to the patient and were aknowledged.

## 2022-06-23 NOTE — Assessment & Plan Note (Signed)
Resolved

## 2022-06-23 NOTE — Assessment & Plan Note (Signed)
Chronic  Xanax prn - rare use  Potential benefits of a long term benzodiazepines  use as well as potential risks  and complications were explained to the patient and were aknowledged.  1/22 Dr Hardin Negus wanted the pt to be off Xanax - d/c. Buspar to try

## 2022-06-23 NOTE — Assessment & Plan Note (Signed)
Fioricet is the only thing that helps w/HA. Not to be used at work, when driving etc

## 2022-06-23 NOTE — Assessment & Plan Note (Signed)
Doxy 100 mg bid

## 2022-06-24 NOTE — Telephone Encounter (Signed)
Rec'd msg PT IS REQUESTING A SCRIPT FOR DOXYCYLINE DUE TO COST OF CEFDINIR...Vanessa Payne

## 2022-06-27 ENCOUNTER — Other Ambulatory Visit: Payer: Self-pay | Admitting: Internal Medicine

## 2022-08-29 ENCOUNTER — Other Ambulatory Visit: Payer: Self-pay | Admitting: Internal Medicine

## 2022-08-31 ENCOUNTER — Other Ambulatory Visit: Payer: Self-pay | Admitting: Internal Medicine

## 2022-09-22 ENCOUNTER — Encounter: Payer: Self-pay | Admitting: Internal Medicine

## 2022-09-22 ENCOUNTER — Ambulatory Visit (INDEPENDENT_AMBULATORY_CARE_PROVIDER_SITE_OTHER): Payer: Commercial Managed Care - HMO | Admitting: Internal Medicine

## 2022-09-22 VITALS — BP 120/74 | HR 86 | Temp 98.1°F | Ht 65.0 in | Wt 127.6 lb

## 2022-09-22 DIAGNOSIS — R051 Acute cough: Secondary | ICD-10-CM | POA: Diagnosis not present

## 2022-09-22 DIAGNOSIS — R296 Repeated falls: Secondary | ICD-10-CM | POA: Insufficient documentation

## 2022-09-22 DIAGNOSIS — W19XXXS Unspecified fall, sequela: Secondary | ICD-10-CM

## 2022-09-22 DIAGNOSIS — W19XXXA Unspecified fall, initial encounter: Secondary | ICD-10-CM | POA: Insufficient documentation

## 2022-09-22 DIAGNOSIS — G8929 Other chronic pain: Secondary | ICD-10-CM

## 2022-09-22 DIAGNOSIS — M545 Low back pain, unspecified: Secondary | ICD-10-CM | POA: Diagnosis not present

## 2022-09-22 DIAGNOSIS — F411 Generalized anxiety disorder: Secondary | ICD-10-CM

## 2022-09-22 DIAGNOSIS — M79604 Pain in right leg: Secondary | ICD-10-CM

## 2022-09-22 MED ORDER — ALBUTEROL SULFATE HFA 108 (90 BASE) MCG/ACT IN AERS: 2.0000 | INHALATION_SPRAY | Freq: Four times a day (QID) | RESPIRATORY_TRACT | 11 refills | Status: DC | PRN

## 2022-09-22 MED ORDER — BUTALBITAL-APAP-CAFFEINE 50-325-40 MG PO TABS: 1.0000 | ORAL_TABLET | Freq: Three times a day (TID) | ORAL | 1 refills | Status: DC | PRN

## 2022-09-22 MED ORDER — ALPRAZOLAM 0.25 MG PO TABS: 0.2500 mg | ORAL_TABLET | Freq: Two times a day (BID) | ORAL | 1 refills | Status: DC | PRN

## 2022-09-22 NOTE — Assessment & Plan Note (Signed)
Chronic  Xanax prn - rare use  Potential benefits of a long term benzodiazepines  use as well as potential risks  and complications were explained to the patient and were aknowledged.
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
## 2022-09-22 NOTE — Assessment & Plan Note (Signed)
Given Doxy po

## 2022-09-22 NOTE — Assessment & Plan Note (Signed)
R leg is shorter Not wearing build-up shoe today - limping

## 2022-09-22 NOTE — Assessment & Plan Note (Signed)
No change in Rx 

## 2022-09-22 NOTE — Assessment & Plan Note (Addendum)
Likely due to leg pain and weakness Pt will see Dr Hardin Negus, Dr Veverly Fells

## 2022-09-25 ENCOUNTER — Other Ambulatory Visit: Payer: Self-pay | Admitting: Internal Medicine

## 2022-10-26 ENCOUNTER — Other Ambulatory Visit: Payer: Self-pay | Admitting: Internal Medicine

## 2022-12-23 ENCOUNTER — Encounter: Payer: Self-pay | Admitting: Internal Medicine

## 2022-12-23 ENCOUNTER — Ambulatory Visit: Payer: Commercial Managed Care - HMO | Admitting: Internal Medicine

## 2022-12-23 VITALS — BP 104/70 | HR 105 | Temp 98.1°F | Ht 65.0 in | Wt 124.0 lb

## 2022-12-23 DIAGNOSIS — Z1211 Encounter for screening for malignant neoplasm of colon: Secondary | ICD-10-CM

## 2022-12-23 DIAGNOSIS — G4710 Hypersomnia, unspecified: Secondary | ICD-10-CM

## 2022-12-23 DIAGNOSIS — E876 Hypokalemia: Secondary | ICD-10-CM

## 2022-12-23 DIAGNOSIS — F332 Major depressive disorder, recurrent severe without psychotic features: Secondary | ICD-10-CM | POA: Diagnosis not present

## 2022-12-23 DIAGNOSIS — M545 Low back pain, unspecified: Secondary | ICD-10-CM | POA: Diagnosis not present

## 2022-12-23 DIAGNOSIS — E538 Deficiency of other specified B group vitamins: Secondary | ICD-10-CM | POA: Diagnosis not present

## 2022-12-23 DIAGNOSIS — G8929 Other chronic pain: Secondary | ICD-10-CM

## 2022-12-23 DIAGNOSIS — F411 Generalized anxiety disorder: Secondary | ICD-10-CM

## 2022-12-23 DIAGNOSIS — Z0000 Encounter for general adult medical examination without abnormal findings: Secondary | ICD-10-CM

## 2022-12-23 MED ORDER — MIRTAZAPINE 30 MG PO TABS: 15.0000 mg | ORAL_TABLET | Freq: Every day | ORAL | 5 refills | Status: DC

## 2022-12-23 MED ORDER — ALBUTEROL SULFATE HFA 108 (90 BASE) MCG/ACT IN AERS: 2.0000 | INHALATION_SPRAY | Freq: Four times a day (QID) | RESPIRATORY_TRACT | 11 refills | Status: DC | PRN

## 2022-12-23 MED ORDER — BUTALBITAL-APAP-CAFFEINE 50-325-40 MG PO TABS: 1.0000 | ORAL_TABLET | Freq: Three times a day (TID) | ORAL | 1 refills | Status: DC | PRN

## 2022-12-23 MED ORDER — CYCLOBENZAPRINE HCL 10 MG PO TABS: ORAL_TABLET | ORAL | 5 refills | Status: DC

## 2022-12-23 MED ORDER — OMEPRAZOLE 40 MG PO CPDR: 40.0000 mg | DELAYED_RELEASE_CAPSULE | Freq: Every day | ORAL | 5 refills | Status: DC

## 2022-12-23 MED ORDER — TOPIRAMATE 100 MG PO TABS: 100.0000 mg | ORAL_TABLET | Freq: Every day | ORAL | 5 refills | Status: DC

## 2022-12-23 MED ORDER — MELOXICAM 15 MG PO TABS: ORAL_TABLET | ORAL | 5 refills | Status: DC

## 2022-12-23 MED ORDER — VENLAFAXINE HCL ER 150 MG PO CP24: 150.0000 mg | ORAL_CAPSULE | Freq: Every day | ORAL | 5 refills | Status: DC

## 2022-12-23 MED ORDER — ZOLPIDEM TARTRATE 5 MG PO TABS: ORAL_TABLET | ORAL | 2 refills | Status: DC

## 2022-12-23 MED ORDER — FUROSEMIDE 20 MG PO TABS: 20.0000 mg | ORAL_TABLET | Freq: Every day | ORAL | 5 refills | Status: DC

## 2022-12-23 MED ORDER — ALPRAZOLAM 0.25 MG PO TABS: 0.2500 mg | ORAL_TABLET | Freq: Two times a day (BID) | ORAL | 1 refills | Status: DC | PRN

## 2022-12-23 MED ORDER — GABAPENTIN 300 MG PO CAPS: ORAL_CAPSULE | ORAL | 5 refills | Status: DC

## 2022-12-23 NOTE — Assessment & Plan Note (Signed)
Chronic  Xanax prn - rare use  Potential benefits of a long term benzodiazepines  use as well as potential risks  and complications were explained to the patient and were aknowledged.  

## 2022-12-23 NOTE — Assessment & Plan Note (Signed)
No relapse Working 40 h/wk No somnolence

## 2022-12-23 NOTE — Assessment & Plan Note (Signed)
Monitor CMET 

## 2022-12-23 NOTE — Assessment & Plan Note (Signed)
On B12 

## 2022-12-23 NOTE — Assessment & Plan Note (Addendum)
  Fioricet is the only thing that helps w/HA. Not to be used at work, when driving etc  Not seeing Dr Hardin Negus - health ins is not covering

## 2022-12-23 NOTE — Assessment & Plan Note (Signed)
Ex-husband  moved back in 10 mo ago
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
## 2023-03-17 ENCOUNTER — Other Ambulatory Visit: Payer: Self-pay | Admitting: Internal Medicine

## 2023-03-24 ENCOUNTER — Encounter: Payer: Self-pay | Admitting: Internal Medicine

## 2023-03-24 ENCOUNTER — Ambulatory Visit (INDEPENDENT_AMBULATORY_CARE_PROVIDER_SITE_OTHER): Payer: Commercial Managed Care - HMO | Admitting: Internal Medicine

## 2023-03-24 VITALS — BP 118/70 | HR 103 | Temp 98.6°F | Ht 65.0 in | Wt 124.0 lb

## 2023-03-24 DIAGNOSIS — Z13220 Encounter for screening for lipoid disorders: Secondary | ICD-10-CM | POA: Diagnosis not present

## 2023-03-24 DIAGNOSIS — R42 Dizziness and giddiness: Secondary | ICD-10-CM

## 2023-03-24 DIAGNOSIS — Z0001 Encounter for general adult medical examination with abnormal findings: Secondary | ICD-10-CM | POA: Diagnosis not present

## 2023-03-24 DIAGNOSIS — R55 Syncope and collapse: Secondary | ICD-10-CM

## 2023-03-24 DIAGNOSIS — G8929 Other chronic pain: Secondary | ICD-10-CM

## 2023-03-24 DIAGNOSIS — N3090 Cystitis, unspecified without hematuria: Secondary | ICD-10-CM

## 2023-03-24 DIAGNOSIS — Z1211 Encounter for screening for malignant neoplasm of colon: Secondary | ICD-10-CM

## 2023-03-24 DIAGNOSIS — Z0000 Encounter for general adult medical examination without abnormal findings: Secondary | ICD-10-CM

## 2023-03-24 DIAGNOSIS — E538 Deficiency of other specified B group vitamins: Secondary | ICD-10-CM

## 2023-03-24 DIAGNOSIS — F411 Generalized anxiety disorder: Secondary | ICD-10-CM

## 2023-03-24 DIAGNOSIS — F332 Major depressive disorder, recurrent severe without psychotic features: Secondary | ICD-10-CM

## 2023-03-24 DIAGNOSIS — M545 Low back pain, unspecified: Secondary | ICD-10-CM | POA: Diagnosis not present

## 2023-03-24 LAB — CBC WITH DIFFERENTIAL/PLATELET
Basophils Absolute: 0.1 10*3/uL (ref 0.0–0.1)
Basophils Relative: 1.2 % (ref 0.0–3.0)
Eosinophils Absolute: 0 10*3/uL (ref 0.0–0.7)
Eosinophils Relative: 0.6 % (ref 0.0–5.0)
HCT: 43 % (ref 36.0–46.0)
Hemoglobin: 14 g/dL (ref 12.0–15.0)
Lymphocytes Relative: 31.3 % (ref 12.0–46.0)
Lymphs Abs: 2.2 10*3/uL (ref 0.7–4.0)
MCHC: 32.5 g/dL (ref 30.0–36.0)
MCV: 97 fl (ref 78.0–100.0)
Monocytes Absolute: 0.6 10*3/uL (ref 0.1–1.0)
Monocytes Relative: 8 % (ref 3.0–12.0)
Neutro Abs: 4.1 10*3/uL (ref 1.4–7.7)
Neutrophils Relative %: 58.9 % (ref 43.0–77.0)
Platelets: 462 10*3/uL — ABNORMAL HIGH (ref 150.0–400.0)
RBC: 4.43 Mil/uL (ref 3.87–5.11)
RDW: 15.3 % (ref 11.5–15.5)
WBC: 7 10*3/uL (ref 4.0–10.5)

## 2023-03-24 LAB — HEPATIC FUNCTION PANEL
ALT: 12 U/L (ref 0–35)
AST: 20 U/L (ref 0–37)
Albumin: 4.1 g/dL (ref 3.5–5.2)
Alkaline Phosphatase: 105 U/L (ref 39–117)
Bilirubin, Direct: 0 mg/dL (ref 0.0–0.3)
Total Bilirubin: 0.2 mg/dL (ref 0.2–1.2)
Total Protein: 8.2 g/dL (ref 6.0–8.3)

## 2023-03-24 LAB — LIPID PANEL
Cholesterol: 299 mg/dL — ABNORMAL HIGH (ref 0–200)
HDL: 55.5 mg/dL (ref >39.00)
NonHDL: 243.84
Total CHOL/HDL Ratio: 5
Triglycerides: 245 mg/dL — ABNORMAL HIGH (ref 0.0–149.0)
VLDL: 49 mg/dL — ABNORMAL HIGH (ref 0.0–40.0)

## 2023-03-24 LAB — URINALYSIS, ROUTINE W REFLEX MICROSCOPIC
Bilirubin Urine: NEGATIVE
Hgb urine dipstick: NEGATIVE
Ketones, ur: NEGATIVE
Nitrite: NEGATIVE
RBC / HPF: NONE SEEN (ref 0–?)
Specific Gravity, Urine: 1.015 (ref 1.000–1.030)
Total Protein, Urine: NEGATIVE
Urine Glucose: NEGATIVE
Urobilinogen, UA: 0.2 (ref 0.0–1.0)
pH: 6 (ref 5.0–8.0)

## 2023-03-24 LAB — COMPREHENSIVE METABOLIC PANEL
ALT: 12 U/L (ref 0–35)
AST: 20 U/L (ref 0–37)
Albumin: 4.1 g/dL (ref 3.5–5.2)
Alkaline Phosphatase: 105 U/L (ref 39–117)
BUN: 19 mg/dL (ref 6–23)
CO2: 23 mEq/L (ref 19–32)
Calcium: 9.9 mg/dL (ref 8.4–10.5)
Chloride: 108 mEq/L (ref 96–112)
Creatinine, Ser: 1.02 mg/dL (ref 0.40–1.20)
GFR: 63.37 mL/min (ref >60.00)
Glucose, Bld: 69 mg/dL — ABNORMAL LOW (ref 70–99)
Potassium: 4.2 mEq/L (ref 3.5–5.1)
Sodium: 140 mEq/L (ref 135–145)
Total Bilirubin: 0.2 mg/dL (ref 0.2–1.2)
Total Protein: 8.2 g/dL (ref 6.0–8.3)

## 2023-03-24 LAB — BASIC METABOLIC PANEL
BUN: 19 mg/dL (ref 6–23)
CO2: 23 mEq/L (ref 19–32)
Calcium: 9.9 mg/dL (ref 8.4–10.5)
Chloride: 108 mEq/L (ref 96–112)
Creatinine, Ser: 1.02 mg/dL (ref 0.40–1.20)
GFR: 63.37 mL/min (ref >60.00)
Glucose, Bld: 69 mg/dL — ABNORMAL LOW (ref 70–99)
Potassium: 4.2 mEq/L (ref 3.5–5.1)
Sodium: 140 mEq/L (ref 135–145)

## 2023-03-24 LAB — LDL CHOLESTEROL, DIRECT: Direct LDL: 205 mg/dL

## 2023-03-24 LAB — TSH: TSH: 2.98 u[IU]/mL (ref 0.35–5.50)

## 2023-03-24 MED ORDER — GABAPENTIN 300 MG PO CAPS: ORAL_CAPSULE | ORAL | 5 refills | Status: DC

## 2023-03-24 MED ORDER — FUROSEMIDE 20 MG PO TABS: 20.0000 mg | ORAL_TABLET | Freq: Every day | ORAL | 5 refills | Status: DC

## 2023-03-24 MED ORDER — VENLAFAXINE HCL ER 150 MG PO CP24: 150.0000 mg | ORAL_CAPSULE | Freq: Every day | ORAL | 5 refills | Status: DC

## 2023-03-24 MED ORDER — CEPHALEXIN 500 MG PO CAPS: 1000.0000 mg | ORAL_CAPSULE | Freq: Four times a day (QID) | ORAL | 0 refills | Status: DC

## 2023-03-24 MED ORDER — ALPRAZOLAM 0.25 MG PO TABS: 0.2500 mg | ORAL_TABLET | Freq: Two times a day (BID) | ORAL | 1 refills | Status: DC | PRN

## 2023-03-24 MED ORDER — BUTALBITAL-APAP-CAFFEINE 50-325-40 MG PO TABS: 1.0000 | ORAL_TABLET | Freq: Three times a day (TID) | ORAL | 1 refills | Status: DC | PRN

## 2023-03-24 MED ORDER — MELOXICAM 15 MG PO TABS: ORAL_TABLET | ORAL | 5 refills | Status: DC

## 2023-03-24 MED ORDER — MIRTAZAPINE 30 MG PO TABS: 15.0000 mg | ORAL_TABLET | Freq: Every day | ORAL | 5 refills | Status: DC

## 2023-03-24 MED ORDER — OMEPRAZOLE 40 MG PO CPDR: 40.0000 mg | DELAYED_RELEASE_CAPSULE | Freq: Every day | ORAL | 5 refills | Status: DC

## 2023-03-24 MED ORDER — ALBUTEROL SULFATE HFA 108 (90 BASE) MCG/ACT IN AERS: 2.0000 | INHALATION_SPRAY | Freq: Four times a day (QID) | RESPIRATORY_TRACT | 11 refills | Status: DC | PRN

## 2023-03-24 NOTE — Assessment & Plan Note (Signed)
Ex-husband  moved back again Chronic stress Cont on Remeron, Effexor

## 2023-03-24 NOTE — Assessment & Plan Note (Signed)
Keflex

## 2023-03-24 NOTE — Assessment & Plan Note (Signed)
  We discussed age appropriate health related issues, including available/recomended screening tests and vaccinations. Labs were ordered to be later reviewed . All questions were answered. We discussed one or more of the following - seat belt use, use of sunscreen/sun exposure exercise, fall risk reduction, second hand smoke exposure, firearm use and storage, seat belt use, a need for adhering to healthy diet and exercise. Labs were ordered.  All questions were answered. Mammo Psychiatry ref Cologuard - pt agreed Labs

## 2023-03-24 NOTE — Assessment & Plan Note (Addendum)
  Discussed polypharmacy No ETOH, no illegal drugs Will d/c Topamax, Zolpidem, Flexeril

## 2023-03-24 NOTE — Assessment & Plan Note (Signed)
On B12 
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
## 2023-03-24 NOTE — Assessment & Plan Note (Addendum)
Vaso-vagal  Discussed polypharmacy No ETOH, no illegal drugs Will d/c Topamax, Zolpidem, Flexeril
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
## 2023-03-24 NOTE — Assessment & Plan Note (Signed)
D/c Zolpidem °

## 2023-03-25 LAB — PMP SCREEN PROFILE (10S), URINE
Amphetamine Scrn, Ur: NEGATIVE ng/mL
BARBITURATE SCREEN URINE: NEGATIVE ng/mL
BENZODIAZEPINE SCREEN, URINE: NEGATIVE ng/mL
CANNABINOIDS UR QL SCN: NEGATIVE ng/mL
Cocaine (Metab) Scrn, Ur: NEGATIVE ng/mL
Creatinine(Crt), U: 81.8 mg/dL (ref 20.0–300.0)
Methadone Screen, Urine: NEGATIVE ng/mL
OXYCODONE+OXYMORPHONE UR QL SCN: NEGATIVE ng/mL
Opiate Scrn, Ur: NEGATIVE ng/mL
Ph of Urine: 5.3 (ref 4.5–8.9)
Phencyclidine Qn, Ur: NEGATIVE ng/mL
Propoxyphene Scrn, Ur: NEGATIVE ng/mL

## 2023-05-13 ENCOUNTER — Other Ambulatory Visit: Payer: Self-pay | Admitting: Internal Medicine

## 2023-05-17 ENCOUNTER — Other Ambulatory Visit: Payer: Self-pay | Admitting: Internal Medicine

## 2023-05-17 NOTE — Telephone Encounter (Signed)
Patient called to check on the status of her refill of butalbital-acetaminophen-caffeine (FIORICET) 50-325-40 MG tablet . Her pharmacy sent requests on 8//24 and 05/17/23. Best callback is [phone].

## 2023-05-27 ENCOUNTER — Encounter (INDEPENDENT_AMBULATORY_CARE_PROVIDER_SITE_OTHER): Payer: Self-pay

## 2023-06-29 ENCOUNTER — Encounter: Payer: Self-pay | Admitting: Internal Medicine

## 2023-06-29 ENCOUNTER — Ambulatory Visit: Payer: Medicaid Other | Admitting: Internal Medicine

## 2023-06-29 VITALS — BP 104/72 | HR 96 | Temp 98.6°F | Ht 65.0 in | Wt 125.4 lb

## 2023-06-29 DIAGNOSIS — Z23 Encounter for immunization: Secondary | ICD-10-CM | POA: Diagnosis not present

## 2023-06-29 DIAGNOSIS — G43109 Migraine with aura, not intractable, without status migrainosus: Secondary | ICD-10-CM

## 2023-06-29 DIAGNOSIS — F411 Generalized anxiety disorder: Secondary | ICD-10-CM | POA: Diagnosis not present

## 2023-06-29 DIAGNOSIS — G4700 Insomnia, unspecified: Secondary | ICD-10-CM

## 2023-06-29 DIAGNOSIS — F332 Major depressive disorder, recurrent severe without psychotic features: Secondary | ICD-10-CM | POA: Diagnosis not present

## 2023-06-29 DIAGNOSIS — J209 Acute bronchitis, unspecified: Secondary | ICD-10-CM

## 2023-06-29 DIAGNOSIS — E538 Deficiency of other specified B group vitamins: Secondary | ICD-10-CM

## 2023-06-29 DIAGNOSIS — M545 Low back pain, unspecified: Secondary | ICD-10-CM

## 2023-06-29 DIAGNOSIS — G8929 Other chronic pain: Secondary | ICD-10-CM

## 2023-06-29 MED ORDER — MIRTAZAPINE 30 MG PO TABS: 15.0000 mg | ORAL_TABLET | Freq: Every day | ORAL | 5 refills | Status: DC

## 2023-06-29 MED ORDER — BUTALBITAL-APAP-CAFFEINE 50-325-40 MG PO TABS: 1.0000 | ORAL_TABLET | Freq: Three times a day (TID) | ORAL | 1 refills | Status: DC | PRN

## 2023-06-29 MED ORDER — AZITHROMYCIN 250 MG PO TABS: ORAL_TABLET | ORAL | 0 refills | Status: DC

## 2023-06-29 MED ORDER — CYCLOBENZAPRINE HCL 5 MG PO TABS: 5.0000 mg | ORAL_TABLET | Freq: Every evening | ORAL | 0 refills | Status: DC | PRN

## 2023-06-29 MED ORDER — ALPRAZOLAM 0.25 MG PO TABS: 0.2500 mg | ORAL_TABLET | Freq: Two times a day (BID) | ORAL | 1 refills | Status: DC | PRN
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
## 2023-06-29 NOTE — Assessment & Plan Note (Signed)
Cont w/Fioricet - the only thing that helps w/HA. Not to be used at work, when driving etc  Potential benefits of a long term Fioricet use as well as potential risks  and complications were explained to the patient and were aknowledged.

## 2023-06-29 NOTE — Assessment & Plan Note (Signed)
Chronic stress Cont on Remeron, Effexor

## 2023-06-29 NOTE — Assessment & Plan Note (Signed)
On B12

## 2023-06-29 NOTE — Assessment & Plan Note (Signed)
On Remeron at hs

## 2023-06-29 NOTE — Assessment & Plan Note (Signed)
A zpack

## 2023-06-29 NOTE — Assessment & Plan Note (Addendum)
Off Zolpidem

## 2023-06-29 NOTE — Assessment & Plan Note (Signed)
Fioricet is the only thing that helps w/HA. Not to be used at work, when driving etc  Not seeing Dr Vear Clock - health ins is not covering

## 2023-07-06 ENCOUNTER — Other Ambulatory Visit: Payer: Self-pay | Admitting: Internal Medicine

## 2023-08-31 ENCOUNTER — Other Ambulatory Visit: Payer: Self-pay | Admitting: Internal Medicine

## 2023-09-28 ENCOUNTER — Ambulatory Visit: Payer: Medicaid Other | Admitting: Internal Medicine

## 2023-09-28 ENCOUNTER — Encounter: Payer: Self-pay | Admitting: Internal Medicine

## 2023-09-28 VITALS — BP 110/62 | HR 64 | Temp 98.2°F | Ht 65.0 in | Wt 124.0 lb

## 2023-09-28 DIAGNOSIS — K219 Gastro-esophageal reflux disease without esophagitis: Secondary | ICD-10-CM

## 2023-09-28 DIAGNOSIS — F411 Generalized anxiety disorder: Secondary | ICD-10-CM | POA: Diagnosis not present

## 2023-09-28 DIAGNOSIS — G4700 Insomnia, unspecified: Secondary | ICD-10-CM | POA: Diagnosis not present

## 2023-09-28 DIAGNOSIS — R296 Repeated falls: Secondary | ICD-10-CM

## 2023-09-28 DIAGNOSIS — F332 Major depressive disorder, recurrent severe without psychotic features: Secondary | ICD-10-CM

## 2023-09-28 DIAGNOSIS — E538 Deficiency of other specified B group vitamins: Secondary | ICD-10-CM

## 2023-09-28 MED ORDER — BUTALBITAL-APAP-CAFFEINE 50-325-40 MG PO TABS: 1.0000 | ORAL_TABLET | Freq: Three times a day (TID) | ORAL | 1 refills | Status: DC | PRN

## 2023-09-28 MED ORDER — CYANOCOBALAMIN 1000 MCG/ML IJ SOLN: 1000.0000 ug | INTRAMUSCULAR | 5 refills | Status: DC

## 2023-09-28 MED ORDER — VENLAFAXINE HCL ER 150 MG PO CP24: 150.0000 mg | ORAL_CAPSULE | Freq: Every day | ORAL | 1 refills | Status: DC

## 2023-09-28 MED ORDER — PANTOPRAZOLE SODIUM 40 MG PO TBEC: 40.0000 mg | DELAYED_RELEASE_TABLET | Freq: Two times a day (BID) | ORAL | 3 refills | Status: DC

## 2023-09-28 MED ORDER — ALPRAZOLAM 0.25 MG PO TABS: 0.2500 mg | ORAL_TABLET | Freq: Two times a day (BID) | ORAL | 2 refills | Status: DC | PRN

## 2023-09-28 NOTE — Assessment & Plan Note (Signed)
Worse D/c Prilosec Start Protonix 40 mg bid GI ref offered, EGD advised - pt declined
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
## 2023-09-28 NOTE — Assessment & Plan Note (Signed)
On Effexor Pt  started to see a psychologist

## 2023-09-28 NOTE — Assessment & Plan Note (Signed)
Pt started to see a psychologist
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
## 2023-09-28 NOTE — Assessment & Plan Note (Addendum)
Pt  started to see a psychologist On Effexor

## 2023-09-28 NOTE — Assessment & Plan Note (Signed)
On B12 

## 2023-10-22 ENCOUNTER — Encounter: Payer: Self-pay | Admitting: Family Medicine

## 2023-10-22 ENCOUNTER — Telehealth (INDEPENDENT_AMBULATORY_CARE_PROVIDER_SITE_OTHER): Payer: Medicaid Other | Admitting: Family Medicine

## 2023-10-22 DIAGNOSIS — F17200 Nicotine dependence, unspecified, uncomplicated: Secondary | ICD-10-CM | POA: Diagnosis not present

## 2023-10-22 DIAGNOSIS — R058 Other specified cough: Secondary | ICD-10-CM

## 2023-10-22 MED ORDER — AZITHROMYCIN 250 MG PO TABS: ORAL_TABLET | ORAL | 0 refills | Status: AC
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
## 2023-12-09 ENCOUNTER — Other Ambulatory Visit: Payer: Self-pay | Admitting: Internal Medicine

## 2023-12-23 ENCOUNTER — Ambulatory Visit: Payer: Medicaid Other | Admitting: Internal Medicine

## 2023-12-23 ENCOUNTER — Encounter: Payer: Self-pay | Admitting: Internal Medicine

## 2023-12-23 VITALS — BP 110/68 | HR 68 | Temp 98.0°F | Ht 65.0 in | Wt 123.0 lb

## 2023-12-23 DIAGNOSIS — G44219 Episodic tension-type headache, not intractable: Secondary | ICD-10-CM

## 2023-12-23 DIAGNOSIS — R051 Acute cough: Secondary | ICD-10-CM

## 2023-12-23 DIAGNOSIS — E538 Deficiency of other specified B group vitamins: Secondary | ICD-10-CM

## 2023-12-23 DIAGNOSIS — R519 Headache, unspecified: Secondary | ICD-10-CM | POA: Insufficient documentation

## 2023-12-23 DIAGNOSIS — F17200 Nicotine dependence, unspecified, uncomplicated: Secondary | ICD-10-CM

## 2023-12-23 DIAGNOSIS — F332 Major depressive disorder, recurrent severe without psychotic features: Secondary | ICD-10-CM | POA: Diagnosis not present

## 2023-12-23 DIAGNOSIS — F411 Generalized anxiety disorder: Secondary | ICD-10-CM | POA: Diagnosis not present

## 2023-12-23 DIAGNOSIS — G4700 Insomnia, unspecified: Secondary | ICD-10-CM

## 2023-12-23 MED ORDER — MELOXICAM 15 MG PO TABS: ORAL_TABLET | ORAL | 1 refills | Status: DC

## 2023-12-23 MED ORDER — GABAPENTIN 300 MG PO CAPS: ORAL_CAPSULE | ORAL | 5 refills | Status: DC

## 2023-12-23 MED ORDER — ALPRAZOLAM 0.25 MG PO TABS: 0.2500 mg | ORAL_TABLET | Freq: Two times a day (BID) | ORAL | 2 refills | Status: DC | PRN

## 2023-12-23 MED ORDER — VENLAFAXINE HCL ER 150 MG PO CP24: 150.0000 mg | ORAL_CAPSULE | Freq: Every day | ORAL | 1 refills | Status: DC

## 2023-12-23 MED ORDER — TOPIRAMATE 100 MG PO TABS: 100.0000 mg | ORAL_TABLET | Freq: Every day | ORAL | 1 refills | Status: DC

## 2023-12-23 MED ORDER — BUTALBITAL-APAP-CAFFEINE 50-325-40 MG PO TABS: 1.0000 | ORAL_TABLET | Freq: Three times a day (TID) | ORAL | 1 refills | Status: DC | PRN

## 2023-12-23 NOTE — Assessment & Plan Note (Signed)
 F/u w/your psychologist On Effexor Chronic  Xanax prn - rare use  Potential benefits of a long term benzodiazepines  use as well as potential risks  and complications were explained to the patient and were aknowledged.
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
## 2023-12-23 NOTE — Assessment & Plan Note (Signed)
 Discussed.
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
## 2023-12-23 NOTE — Assessment & Plan Note (Signed)
 Pt  started to see a psychologist On Remeron

## 2023-12-23 NOTE — Assessment & Plan Note (Signed)
 F/u w/your psychologist On Effexor

## 2023-12-23 NOTE — Assessment & Plan Note (Signed)
Fioricet prn  Potential benefits of a long term fioricet  use as well as potential risks  and complications were explained to the patient and were aknowledged.

## 2023-12-23 NOTE — Assessment & Plan Note (Signed)
 On B12

## 2024-02-01 ENCOUNTER — Other Ambulatory Visit: Payer: Self-pay | Admitting: Internal Medicine

## 2024-02-02 ENCOUNTER — Other Ambulatory Visit: Payer: Self-pay

## 2024-02-02 ENCOUNTER — Emergency Department (HOSPITAL_COMMUNITY)

## 2024-02-02 ENCOUNTER — Emergency Department (HOSPITAL_COMMUNITY)
Admission: EM | Admit: 2024-02-02 | Discharge: 2024-02-02 | Disposition: A | Discharge status: Home / Self Care | Attending: Emergency Medicine | Admitting: Emergency Medicine

## 2024-02-02 DIAGNOSIS — M542 Cervicalgia: Secondary | ICD-10-CM | POA: Insufficient documentation

## 2024-02-02 DIAGNOSIS — R079 Chest pain, unspecified: Secondary | ICD-10-CM | POA: Insufficient documentation

## 2024-02-02 DIAGNOSIS — M25512 Pain in left shoulder: Secondary | ICD-10-CM | POA: Insufficient documentation

## 2024-02-02 DIAGNOSIS — M5412 Radiculopathy, cervical region: Secondary | ICD-10-CM

## 2024-02-02 LAB — BASIC METABOLIC PANEL WITH GFR
Anion gap: 11 (ref 5–15)
BUN: 13 mg/dL (ref 6–20)
CO2: 19 mmol/L — ABNORMAL LOW (ref 22–32)
Calcium: 9.5 mg/dL (ref 8.9–10.3)
Chloride: 109 mmol/L (ref 98–111)
Creatinine, Ser: 1.22 mg/dL — ABNORMAL HIGH (ref 0.44–1.00)
GFR, Estimated: 53 mL/min — ABNORMAL LOW (ref >60)
Glucose, Bld: 88 mg/dL (ref 70–99)
Potassium: 3.7 mmol/L (ref 3.5–5.1)
Sodium: 139 mmol/L (ref 135–145)

## 2024-02-02 LAB — CBC
HCT: 40.2 % (ref 36.0–46.0)
Hemoglobin: 13 g/dL (ref 12.0–15.0)
MCH: 31.3 pg (ref 26.0–34.0)
MCHC: 32.3 g/dL (ref 30.0–36.0)
MCV: 96.9 fL (ref 80.0–100.0)
Platelets: 363 10*3/uL (ref 150–400)
RBC: 4.15 MIL/uL (ref 3.87–5.11)
RDW: 13.2 % (ref 11.5–15.5)
WBC: 8.5 10*3/uL (ref 4.0–10.5)
nRBC: 0 % (ref 0.0–0.2)

## 2024-02-02 LAB — TROPONIN I (HIGH SENSITIVITY)
Troponin I (High Sensitivity): 3 ng/L (ref <18)
Troponin I (High Sensitivity): <2 ng/L (ref <18)

## 2024-02-02 LAB — HCG, SERUM, QUALITATIVE: Preg, Serum: NEGATIVE

## 2024-02-02 MED ORDER — PREDNISONE 10 MG PO TABS: ORAL_TABLET | ORAL | 0 refills | Status: DC

## 2024-02-02 MED ORDER — HYDROCODONE-ACETAMINOPHEN 5-325 MG PO TABS: 1.0000 | ORAL_TABLET | ORAL | 0 refills | Status: DC | PRN

## 2024-02-02 NOTE — ED Provider Notes (Signed)
 [place] EMERGENCY DEPARTMENT AT [facility] Provider Note   CSN: [id number] Arrival date & time: 02/02/24  0257     History  Chief Complaint  Patient presents with   Chest Pain    Vanessa Payne is a 53 y.o. female.  Patient complains of pain in the left side of her neck and left arm.  Patient reports she has some pain in her chest and into her left shoulder.  Patient reports that the symptoms began yesterday.  Patient states that she has tried Tylenol  and ibuprofen without relief.  Patient reports she has the pain when she moves her neck.  She does not have pain when she moves her shoulder.  Patient denies any shortness of breath patient denies any nausea she has not had any sweating.  Patient reports she has had a history of back problems in the past but she has never had any problems with her neck.  The history is provided by the patient. No language interpreter was used.  Chest Pain      Home Medications Prior to Admission medications   Medication Sig Start Date End Date Taking? Authorizing Provider  HYDROcodone -acetaminophen  (NORCO/VICODIN) 5-325 MG tablet Take 1 tablet by mouth every 4 (four) hours as needed. 02/02/24 02/01/25 Yes Dorothey Gate K, PA-C  predniSONE  (DELTASONE ) 10 MG tablet 6,5,4,3,2,1 taper 02/02/24  Yes Lakeya Mulka K, PA-C  albuterol  (VENTOLIN  HFA) 108 (90 Base) MCG/ACT inhaler Inhale 2 puffs into the lungs every 6 (six) hours as needed for wheezing or shortness of breath. 03/24/23   Plotnikov, Aleksei V, MD  ALPRAZolam  (XANAX ) 0.25 MG tablet Take 1 tablet (0.25 mg total) by mouth 2 (two) times daily as needed for anxiety. 12/23/23   Plotnikov, Aleksei V, MD  butalbital -acetaminophen -caffeine  (FIORICET ) 50-325-40 MG tablet TAKE 1 TABLET BY MOUTH 3 (THREE) TIMES DAILY AS NEEDED FOR HEADACHE. 02/02/24   Plotnikov, Aleksei V, MD  Cholecalciferol (VITAMIN D3) 5000 UNITS CAPS Take 1 capsule by mouth daily.    [provider]  cyanocobalamin   (VITAMIN B12) 1000 MCG/ML injection Inject 1 mL (1,000 mcg total) into the muscle every 14 (fourteen) days. 09/28/23   Plotnikov, Oakley Bellman, MD  cyclobenzaprine  (FLEXERIL ) 5 MG tablet TAKE 1 TABLET BY MOUTH AT BEDTIME AS NEEDED FOR MUSCLE SPASMS. 09/01/23   Plotnikov, Oakley Bellman, MD  furosemide  (LASIX ) 20 MG tablet Take 1 tablet (20 mg total) by mouth daily. 03/24/23   Plotnikov, Oakley Bellman, MD  gabapentin  (NEURONTIN ) 300 MG capsule TAKE 2 CAPSULES BY MOUTH 3 TIMES A DAY AS NEEDED 12/23/23   Plotnikov, Aleksei V, MD  meloxicam  (MOBIC ) 15 MG tablet TAKE 1 TABLET BY MOUTH EVERY DAY AS NEEDED FOR PAIN 12/23/23   Plotnikov, Aleksei V, MD  mirtazapine  (REMERON ) 30 MG tablet TAKE 0.5-1 TABLETS (15-30 MG TOTAL) BY MOUTH AT BEDTIME. TAKE 1 TABLET BY MOUTH EVERY DAY AT BEDTIME 07/07/23   Plotnikov, Aleksei V, MD  pantoprazole  (PROTONIX ) 40 MG tablet Take 1 tablet (40 mg total) by mouth 2 (two) times daily. 09/28/23   Plotnikov, Oakley Bellman, MD  topiramate  (TOPAMAX ) 100 MG tablet Take 1 tablet (100 mg total) by mouth daily. 12/23/23   Plotnikov, Aleksei V, MD  venlafaxine  XR (EFFEXOR -XR) 150 MG 24 hr capsule Take 1 capsule (150 mg total) by mouth daily with breakfast. 12/23/23   Plotnikov, Oakley Bellman, MD      Allergies    Ketorolac tromethamine    Review of Systems   Review of Systems  Cardiovascular:  Positive for chest pain.  All other systems reviewed and are negative.   Physical Exam Updated Vital Signs BP 130/84   Pulse 80   Temp 97.7 F (36.5 C) (Oral)   Resp 18   SpO2 98%  Physical Exam Vitals and nursing note reviewed.  Constitutional:      Appearance: She is well-developed.  HENT:     Head: Normocephalic.  Cardiovascular:     Rate and Rhythm: Normal rate and regular rhythm.     Heart sounds: Normal heart sounds.  Pulmonary:     Effort: Pulmonary effort is normal.     Breath sounds: Normal breath sounds.  Abdominal:     General: There is no distension.     Palpations: Abdomen is soft.   Musculoskeletal:        General: Normal range of motion.     Cervical back: Normal range of motion.  Skin:    General: Skin is warm.  Neurological:     Mental Status: She is alert and oriented to person, place, and time.  Psychiatric:        Mood and Affect: Mood normal.     ED Results / Procedures / Treatments   Labs (all labs ordered are listed, but only abnormal results are displayed) Labs Reviewed  BASIC METABOLIC PANEL WITH GFR - Abnormal; Notable for the following components:      Result Value   CO2 19 (*)    Creatinine, Ser 1.22 (*)    GFR, Estimated 53 (*)    All other components within normal limits  CBC  HCG, SERUM, QUALITATIVE  TROPONIN I (HIGH SENSITIVITY)  TROPONIN I (HIGH SENSITIVITY)    EKG None  Radiology DG Chest 2 View Result Date: 02/02/2024 CLINICAL DATA:  Chest pain EXAM: CHEST - 2 VIEW COMPARISON:  07/02/2020 FINDINGS: The heart size and mediastinal contours are within normal limits. Both lungs are clear. The visualized skeletal structures are unremarkable. IMPRESSION: No active cardiopulmonary disease. Electronically Signed   By: Juanetta Nordmann M.D.   On: 02/02/2024 03:59    Procedures Procedures    Medications Ordered in ED Medications - No data to display  ED Course/ Medical Decision Making/ A&P                                 Medical Decision Making Patient complains of pain in the left side of her neck left chest and left scapula area.  Amount and/or Complexity of Data Reviewed Labs: ordered. Decision-making details documented in ED Course.    Details: Labs ordered reviewed reviewed and interpreted troponin is negative. Radiology: ordered and independent interpretation performed. Decision-making details documented in ED Course.    Details: Chest xray  shows no acute findings  ECG/medicine tests: ordered and independent interpretation performed. Decision-making details documented in ED Course.    Details: EKG shows normal sinus normal  EKG  Risk Prescription drug management. Risk Details: Patient's pain seems to be more radicular I suspect some cervical radiculopathy.  Patient has no weakness.  No neurodeficits.  I discussed with patient I will try patient on a course of prednisone  she is given a prescription for hydrocodone .  Patient is advised to follow-up with her primary care physician for recheck return to the emergency department if any problems.           Final Clinical Impression(s) / ED Diagnoses Final diagnoses:  None    Rx /  DC Orders ED Discharge Orders          Ordered    HYDROcodone -acetaminophen  (NORCO/VICODIN) 5-325 MG tablet  Every 4 hours PRN        02/02/24 1149    predniSONE  (DELTASONE ) 10 MG tablet       Note to Pharmacy: Please provide taper dose pack   02/02/24 1149              Sandi Crosby, PA-C 02/02/24 1459    Burnette Carte, MD 02/02/24 1550

## 2024-02-02 NOTE — ED Triage Notes (Signed)
 Pt reports intermittent sharp left sided chest pain that radiates into left shoulder, back, and neck that started yesterday. Associated with SOB, lightheaded, nausea, back pain, and weakness. No known cardiac hx

## 2024-02-02 NOTE — Discharge Instructions (Signed)
 Return if any problems.

## 2024-02-08 ENCOUNTER — Telehealth: Payer: Self-pay

## 2024-02-08 ENCOUNTER — Encounter: Payer: Self-pay | Admitting: Internal Medicine

## 2024-02-08 ENCOUNTER — Other Ambulatory Visit (HOSPITAL_COMMUNITY): Payer: Self-pay

## 2024-02-08 ENCOUNTER — Telehealth (INDEPENDENT_AMBULATORY_CARE_PROVIDER_SITE_OTHER): Admitting: Internal Medicine

## 2024-02-08 DIAGNOSIS — E538 Deficiency of other specified B group vitamins: Secondary | ICD-10-CM | POA: Diagnosis not present

## 2024-02-08 DIAGNOSIS — M5412 Radiculopathy, cervical region: Secondary | ICD-10-CM | POA: Diagnosis not present

## 2024-02-08 MED ORDER — HYDROCODONE-ACETAMINOPHEN 10-325 MG PO TABS: 1.0000 | ORAL_TABLET | Freq: Four times a day (QID) | ORAL | 0 refills | Status: DC | PRN

## 2024-02-08 MED ORDER — PREDNISONE 10 MG PO TABS
ORAL_TABLET | ORAL | 1 refills | Status: DC
Start: 2024-02-08 — End: 2024-03-02

## 2024-02-08 NOTE — Assessment & Plan Note (Signed)
Continue with B12 

## 2024-02-08 NOTE — Assessment & Plan Note (Addendum)
 Excruciating pain in the cervical spine radiating to the left arm/hand, left shoulder blade, paresthesia starting on 02-01-24.  Status post ER visit-MI ruled out.  Chest x-ray was normal. Probable radiculopathy.  Obtain cervical MRI scan ASAP Prescribed hydrocodone , another round of prednisone  Follow-up with me in 2 weeks Do not take Fioricet   Potential benefits of a short term opioids use as well as potential risks (i.e. addiction risk, apnea etc) and complications (i.e. Somnolence, constipation and others) were explained to the patient and were aknowledged.

## 2024-02-08 NOTE — Telephone Encounter (Signed)
 Pharmacy Patient Advocate Encounter  Received notification from [facility] that Prior Authorization for HYDROcodone -Acetaminophen  10-325MG  tablets has been APPROVED from 02/08/24 to 08/06/24   PA #/Case ID/Reference #: [id number]

## 2024-02-08 NOTE — Telephone Encounter (Signed)
 Being addressed in a separate note

## 2024-02-08 NOTE — Progress Notes (Signed)
 Virtual Visit via Video Note  I connected with Hillard Lowes on 02/08/24 at  8:10 AM EDT by a video enabled telemedicine application and verified that I am speaking with the correct person using two identifiers.   I discussed the limitations of evaluation and management by telemedicine and the availability of in person appointments. The patient expressed understanding and agreed to proceed.  I was located at our [facility] office. The patient was at home. There was no one else present in the visit.  Chief Complaint  Patient presents with   Hospitalization Follow-up    Hospital follow up (04/23). Symptoms prompting visit was initial pain in neck that eventually radiated to chest and then pain all down left side. Prescribed prednisone  and hydrocodone  which helped some but patient is still in excruciating pain. Patient notes oxygen level has been extremely low, having issues staying hydrated     History of Present Illness: C/o excruciating pain in the cervical spine radiating to the left arm/hand, left shoulder blade, paresthesia starting on 02-01-24.  Status post ER visit-MI ruled out.  Chest x-ray was normal.  Prescribed hydrocodone  and prednisone  by the emergency room physician.  Not feeling any better yet   Review of Systems  Constitutional:  Positive for malaise/fatigue. Negative for chills and fever.  Respiratory:  Negative for shortness of breath.   Cardiovascular:  Negative for chest pain.  Musculoskeletal:  Positive for joint pain and neck pain.  Neurological:  Positive for tingling. Negative for weakness.  Psychiatric/Behavioral:  The patient is nervous/anxious.      Observations/Objective: The patient appears to be in no acute distress  Assessment and Plan:  Problem List Items Addressed This Visit     B12 deficiency   Continue with B12      Cervical radiculopathy - Primary   Excruciating pain in the cervical spine radiating to the left arm/hand, left shoulder  blade, paresthesia starting on 02-01-24.  Status post ER visit-MI ruled out.  Chest x-ray was normal. Probable radiculopathy.  Obtain cervical MRI scan ASAP Prescribed hydrocodone , another round of prednisone  Follow-up with me in 2 weeks Do not take Fioricet   Potential benefits of a short term opioids use as well as potential risks (i.e. addiction risk, apnea etc) and complications (i.e. Somnolence, constipation and others) were explained to the patient and were aknowledged.         Relevant Orders   MR Cervical Spine Wo Contrast     Meds ordered this encounter  Medications   HYDROcodone -acetaminophen  (NORCO) 10-325 MG tablet    Sig: Take 1 tablet by mouth every 6 (six) hours as needed for severe pain (pain score 7-10).    Dispense:  60 tablet    Refill:  0   predniSONE  (DELTASONE ) 10 MG tablet    Sig: Prednisone  10 mg: take 4 tabs a day x 3 days; then 3 tabs a day x 4 days; then 2 tabs a day x 4 days, then 1 tab a day x 6 days, then stop. Take pc.    Dispense:  38 tablet    Refill:  1     Follow Up Instructions:    I discussed the assessment and treatment plan with the patient. The patient was provided an opportunity to ask questions and all were answered. The patient agreed with the plan and demonstrated an understanding of the instructions.   The patient was advised to call back or seek an in-person evaluation if the symptoms worsen or if the  condition fails to improve as anticipated.  I provided face-to-face time during this encounter. We were at different locations.   Anitra Barn, MD

## 2024-02-08 NOTE — Telephone Encounter (Signed)
 Pharmacy Patient Advocate Encounter   Received notification from CoverMyMeds that prior authorization for HYDROCODONE -APAP 10/325MG   is required/requested.   Insurance verification completed.   The patient is insured through [facility] .   Per test claim: PA required; PA submitted to above mentioned insurance via CoverMyMeds Key/confirmation #/EOC [id number] Status is pending

## 2024-02-08 NOTE — Telephone Encounter (Signed)
 PA has been submitted by the PA and is awaiting the response from pts insurance once PA is completed.  Pharmacy Patient Advocate Encounter   Received notification from CoverMyMeds that prior authorization for HYDROCODONE -APAP 10/325MG   is required/requested.   Insurance verification completed.   The patient is insured through [facility] .   Per test claim: PA required; PA submitted to above mentioned insurance via CoverMyMeds Key/confirmation #/EOC [id number] Status is pending

## 2024-02-08 NOTE — Telephone Encounter (Signed)
 Pharmacy Patient Advocate Encounter   Received notification from CoverMyMeds that prior authorization for HYDROcodone -Acetaminophen  10-325MG  tablets is required/requested.   Insurance verification completed.   The patient is insured through [facility] .   Per test claim: PA required; PA submitted to above mentioned insurance via CoverMyMeds Key/confirmation #/EOC [id number] Status is pending

## 2024-02-08 NOTE — Telephone Encounter (Signed)
 Copied from CRM [id number]. Topic: Clinical - Prescription Issue >> Feb 08, 2024 12:22 PM [place] A wrote: Reason for CRM: Patient said that Insurance will need to be contacted regarding prescription written today for Hydrocodone  so that medication will be covered.

## 2024-02-09 ENCOUNTER — Telehealth: Payer: Self-pay

## 2024-02-09 NOTE — Telephone Encounter (Signed)
 Copied from CRM [id number]. Topic: General - Other >> Feb 09, 2024  1:48 PM Trula Gable C wrote: Reason for CRM: Patient called in regarding appointment for MRI, Would like to know the status of the MRI, and would like a response back through mychart when it has been scheduled

## 2024-02-16 ENCOUNTER — Ambulatory Visit (HOSPITAL_COMMUNITY)
Admission: RE | Admit: 2024-02-16 | Discharge: 2024-02-16 | Disposition: A | Discharge status: Home / Self Care | Source: Ambulatory Visit | Attending: Internal Medicine | Admitting: Internal Medicine

## 2024-02-16 DIAGNOSIS — M5412 Radiculopathy, cervical region: Secondary | ICD-10-CM | POA: Insufficient documentation

## 2024-02-17 ENCOUNTER — Telehealth: Payer: Self-pay | Admitting: Internal Medicine

## 2024-02-17 NOTE — Telephone Encounter (Signed)
 error

## 2024-02-18 ENCOUNTER — Other Ambulatory Visit: Payer: Self-pay | Admitting: Nurse Practitioner

## 2024-02-18 ENCOUNTER — Telehealth (INDEPENDENT_AMBULATORY_CARE_PROVIDER_SITE_OTHER): Admitting: Nurse Practitioner

## 2024-02-18 DIAGNOSIS — M5412 Radiculopathy, cervical region: Secondary | ICD-10-CM | POA: Diagnosis not present

## 2024-02-18 MED ORDER — METHOCARBAMOL 500 MG PO TABS: 500.0000 mg | ORAL_TABLET | Freq: Three times a day (TID) | ORAL | 0 refills | Status: DC | PRN

## 2024-02-18 MED ORDER — HYDROCODONE-ACETAMINOPHEN 10-325 MG PO TABS: 1.0000 | ORAL_TABLET | Freq: Four times a day (QID) | ORAL | 0 refills | Status: AC | PRN

## 2024-02-18 NOTE — Assessment & Plan Note (Signed)
 Acute MRI results are abnormal Some evidence of foraminal stenosis noted, will order urgent referral to neurosurgery. Pain is still quite severe per patient, numbness and tingling appear stable. Will send an additional 5-day course of hydrocodone -acetaminophen  10-325 mg tablet, she can take 1 tablet every 6 hours as needed for severe pain.  She would like to try different muscle relaxer.  Will send in methocarbamol  500 mg tablets that she can take every 8 hours as needed.  She was educated to hold Fioricet  and avoid taking hydrocodone  at the same time as methocarbamol  and Xanax  to prevent oversedation.  We also discussed avoidance of operating heavy machinery or driving after taking pain medication or muscle relaxer.  She reports her understanding. She was told if she experiences acutely worsening sensory changes or acutely worsening weakness that she should proceed to the ER.  She reports understanding.

## 2024-02-18 NOTE — Progress Notes (Signed)
 Established Patient Office Visit  An audio/visual tele-health visit was completed today for this patient. I connected with  Vanessa Payne on 02/18/24 utilizing audio/visual technology and verified that I am speaking with the correct person using two identifiers. The patient was located at their home, and I was located at the office of [facility] at [facility] during the encounter. I discussed the limitations of evaluation and management by telemedicine. The patient expressed understanding and agreed to proceed.    Subjective   Patient ID: Vanessa Payne, female    DOB: 1970/12/09  Age: 53 y.o. MRN: [id number]  Chief Complaint  Patient presents with   cervical radiculopathy    Patient arrives today for the above.  About 3 weeks ago she woke up with what she felt was a muscle spasm in her neck.  She treated this conservatively and initially it improved.  Then a day or so later she woke up with similar symptoms that got progressively worse resulting in severe pain radiating down her cervical and thoracic spine and into her left arm.  She was also experiencing numbness and tingling in the left arm.  She presented to the emergency department and cardiac etiology was ruled out.  She was treated with course of steroids and told to follow-up PCP.  PCP then ordered MRI of cervical spine.  Results as follows below: "IMPRESSION: 1. Shallow right-sided disc osteophyte complex at C3-4 with mild impression on the right side of the thecal sac but no significant spinal or foraminal stenosis. 2. Bulging annulus and mild osteophytic ridging at C5-6 with mild flattening of the ventral thecal sac and mild narrowing of the ventral CSF space. There is also mild right foraminal encroachment. 3. Moderate degenerative disc disease at C6-7 with disc space narrowing and osteophytic ridging. There is a bulging degenerated annulus and uncinate spurring changes. Flattening of the ventral thecal sac  and narrowing the ventral CSF space but no significant spinal stenosis. There is mild foraminal stenosis bilaterally."  She presents today to discuss results.  Today she reports that pain is still quite severe she is currently treating her pain with as needed Flexeril  and hydrocodone -Tylenol  (10mg -325mg  Q 6 hours as needed).  She reports generally pain is not much improved with this pain management strategy as of right now.  She continues to have numbness and tingling to her left arm that seems to improve when she is active and moving her arm, but then worsens when she is at rest.  She has not noticed any change in grip strength or extremity strength.    ROS: see HPI    Objective:     There were no vitals taken for this visit.   Physical Exam Comprehensive physical exam not completed today as office visit was conducted remotely.  Patient appears well overall on video.  Patient was alert and oriented, and appeared to have appropriate judgment.   No results found for any visits on 02/18/24.    The 10-year ASCVD risk score (Arnett DK, et al., 2019) is: 6.8%    Assessment & Plan:   Problem List Items Addressed This Visit       Nervous and Auditory   Cervical radiculopathy - Primary   Acute MRI results are abnormal Some evidence of foraminal stenosis noted, will order urgent referral to neurosurgery. Pain is still quite severe per patient, numbness and tingling appear stable. Will send an additional 5-day course of hydrocodone -acetaminophen  10-325 mg tablet, she can take 1  tablet every 6 hours as needed for severe pain.  She would like to try different muscle relaxer.  Will send in methocarbamol  500 mg tablets that she can take every 8 hours as needed.  She was educated to hold Fioricet  and avoid taking hydrocodone  at the same time as methocarbamol  and Xanax  to prevent oversedation.  We also discussed avoidance of operating heavy machinery or driving after taking pain medication or  muscle relaxer.  She reports her understanding. She was told if she experiences acutely worsening sensory changes or acutely worsening weakness that she should proceed to the ER.  She reports understanding.      Relevant Medications   HYDROcodone -acetaminophen  (NORCO) 10-325 MG tablet   methocarbamol  (ROBAXIN ) 500 MG tablet    Return if symptoms worsen or fail to improve.    Zorita Hiss, NP

## 2024-02-25 ENCOUNTER — Other Ambulatory Visit: Payer: Self-pay | Admitting: Internal Medicine

## 2024-02-25 NOTE — Telephone Encounter (Signed)
 Copied from CRM [id number]. Topic: Clinical - Medication Refill >> Feb 25, 2024  8:40 AM [name] wrote: Medication: hydrocodone    Has the patient contacted their pharmacy? Yes (Agent: If no, request that the patient contact the pharmacy for the refill. If patient does not wish to contact the pharmacy document the reason why and proceed with request.) (Agent: If yes, when and what did the pharmacy advise?)  This is the patient's preferred pharmacy:  [id number] [place], [place] - [address] [place] [address] Phone: [phone] Fax: [phone]  Is this the correct pharmacy for this prescription? Yes If no, delete pharmacy and type the correct one.   Has the prescription been filled recently? No  Is the patient out of the medication? Yes  Has the patient been seen for an appointment in the last year OR does the patient have an upcoming appointment? Yes  Can we respond through MyChart? Yes  Agent: Please be advised that Rx refills may take up to 3 business days. We ask that you follow-up with your pharmacy. >> Feb 25, 2024  8:41 AM Dorisann Garre T wrote: Patient still has yet to hear from the neurosurgeon

## 2024-02-29 ENCOUNTER — Ambulatory Visit: Payer: Self-pay

## 2024-02-29 ENCOUNTER — Telehealth: Payer: Self-pay | Admitting: Internal Medicine

## 2024-02-29 NOTE — Telephone Encounter (Signed)
 Attempted to reach out to pt to triage after receiving medication request for hydrocodone : pt did not answer: voicemail left requesting patient to give us  a call back.

## 2024-02-29 NOTE — Telephone Encounter (Signed)
 CRM [id number]. Topic: Clinical - Medication Refill >> Feb 29, 2024  8:34 AM Lynnie Saucier S wrote: Medication: HYDROcodone -acetaminophen  (NORCO) 10-325 MG tablet   Patient still experiencing pain that she was seen for recently and is requesting a refill for Norco. Patient states her pain has not changed but is still present. She denies any new symptoms. Patient has upcomming appointment with neurosurgery to address this issue on 03/09/24 and would like a prescription to help with the pain until that appointment. Please advise.    Reason for Disposition  Caller requesting a CONTROLLED substance prescription refill (e.g., narcotics, ADHD medicines)  Answer Assessment - Initial Assessment Questions 1. DRUG NAME: "What medicine do you need to have refilled?"     Norco 2. SYMPTOMS: "Do you have any symptoms?"     Pain from shoulder to neck to arm, seen for same before  Protocols used: Medication Refill and Renewal Call-A-AH

## 2024-02-29 NOTE — Telephone Encounter (Signed)
 CRM [id number]. Topic: Clinical - Medication Refill >> Feb 29, 2024  8:34 AM Lynnie Saucier S wrote: Medication: HYDROcodone -acetaminophen  (NORCO) 10-325 MG tablet  2nd attempt made to reach out to patient regarding medication: no answer: left voicemail.

## 2024-02-29 NOTE — Telephone Encounter (Signed)
 Copied from CRM [id number]. Topic: Clinical - Medication Refill >> Feb 29, 2024  8:34 AM Baldomero Bone wrote: Medication: HYDROcodone -acetaminophen  (NORCO) 10-325 MG tablet  Has the patient contacted their pharmacy? No (Agent: If no, request that the patient contact the pharmacy for the refill. If patient does not wish to contact the pharmacy document the reason why and proceed with request.) (Agent: If yes, when and what did the pharmacy advise?)  This is the patient's preferred pharmacy:  [id number] [place], [place] - [address] [place] [address] Phone: [phone] Fax: [phone]  Is this the correct pharmacy for this prescription? Yes If no, delete pharmacy and type the correct one.   Has the prescription been filled recently? No  Is the patient out of the medication? Yes  Has the patient been seen for an appointment in the last year OR does the patient have an upcoming appointment? Yes  Can we respond through MyChart? Yes  Agent: Please be advised that Rx refills may take up to 3 business days. We ask that you follow-up with your pharmacy.

## 2024-03-01 ENCOUNTER — Ambulatory Visit: Payer: Self-pay

## 2024-03-01 NOTE — Telephone Encounter (Signed)
 Chief Complaint: Stomach discomfort Symptoms: Abdominal pain  Frequency: since Friday Pertinent Negatives: Patient denies - see notes Disposition: [] ED /[] Urgent Care (no appt availability in office) / [x] Appointment(In office/virtual)/ []  [facility]/ [] Home Care/ [] Refused Recommended Disposition /[]  Mobile Bus/ []  Follow-up with PCP Additional Notes: Patient reports having abdominal pain spreading across stomach since Friday. Patient states she has issues with this type of pain intermittently but it doesn't normally last this long. Patient has tried her prescribed medications and also OTC meds and no relief. Patient finds minimal relief with bowel movements, tums, and standing up straight or lying flat. Patient states it causes pressure in her chest but is also relieved with the items mentioned. Patient educated on s/s of when to call 911 or go to ED for cardiac event. Patient appt made for tomorrow for evaluation.    Copied from CRM [id number]. Topic: Clinical - Red Word Triage >> Mar 01, 2024  8:09 AM Essie A wrote: Red Word that prompted transfer to Nurse Triage: Severe stomach pain for 5 days Reason for Disposition  [1] MODERATE pain (e.g., interferes with normal activities) AND [2] pain comes and goes (cramps) AND [3] present > 24 hours  (Exception: Pain with Vomiting or Diarrhea - see that Guideline.)  Answer Assessment - Initial Assessment Questions 1. LOCATION: "Where does it hurt?"      Spreads across entire abdomen in front 2. RADIATION: "Does the pain shoot anywhere else?" (e.g., chest, back)     Patient can't fully report 3. ONSET: "When did the pain begin?" (e.g., minutes, hours or days ago)      5 days 4. SUDDEN: "Gradual or sudden onset?"     Gradually worsening 5. PATTERN "Does the pain come and go, or is it constant?"    - If it comes and goes: "How long does it last?" "Do you have pain now?"     (Note: Comes and goes means the pain is intermittent.  It goes away completely between bouts.)    - If constant: "Is it getting better, staying the same, or getting worse?"      (Note: Constant means the pain never goes away completely; most serious pain is constant and gets worse.)      Comes and goes in intensity 6. SEVERITY: "How bad is the pain?"  (e.g., Scale 1-10; mild, moderate, or severe)    - MILD (1-3): Doesn't interfere with normal activities, abdomen soft and not tender to touch.     - MODERATE (4-7): Interferes with normal activities or awakens from sleep, abdomen tender to touch.     - SEVERE (8-10): Excruciating pain, doubled over, unable to do any normal activities.       Changes in intensity - today it is 8-9 7. RECURRENT SYMPTOM: "Have you ever had this type of stomach pain before?" If Yes, ask: "When was the last time?" and "What happened that time?"      Have had this type of pain before years ago 8. CAUSE: "What do you think is causing the stomach pain?"     Patient believes it's an ulcer 9. RELIEVING/AGGRAVATING FACTORS: "What makes it better or worse?" (e.g., antacids, bending or twisting motion, bowel movement)     Lying flat or standing up straight eases pain, bowel movement eases pain for very minimal time, antacids causes ease in pain for an hour 10. OTHER SYMPTOMS: "Do you have any other symptoms?" (e.g., back pain, diarrhea, fever, urination pain, vomiting)  Chest pressure  Protocols used: Abdominal Pain - Female-A-AH

## 2024-03-02 ENCOUNTER — Encounter: Payer: Self-pay | Admitting: Internal Medicine

## 2024-03-02 ENCOUNTER — Ambulatory Visit: Admitting: Internal Medicine

## 2024-03-02 VITALS — BP 122/82 | HR 107 | Temp 98.1°F | Ht 65.0 in | Wt 125.0 lb

## 2024-03-02 DIAGNOSIS — K859 Acute pancreatitis without necrosis or infection, unspecified: Secondary | ICD-10-CM

## 2024-03-02 DIAGNOSIS — M5412 Radiculopathy, cervical region: Secondary | ICD-10-CM

## 2024-03-02 DIAGNOSIS — R1013 Epigastric pain: Secondary | ICD-10-CM

## 2024-03-02 LAB — URINALYSIS
Bilirubin Urine: NEGATIVE
Hgb urine dipstick: NEGATIVE
Leukocytes,Ua: NEGATIVE
Nitrite: NEGATIVE
Specific Gravity, Urine: 1.02 (ref 1.000–1.030)
Total Protein, Urine: NEGATIVE
Urine Glucose: 100 — AB
Urobilinogen, UA: 0.2 (ref 0.0–1.0)
pH: 6 (ref 5.0–8.0)

## 2024-03-02 LAB — CBC WITH DIFFERENTIAL/PLATELET
Basophils Absolute: 0.1 10*3/uL (ref 0.0–0.1)
Basophils Relative: 0.5 % (ref 0.0–3.0)
Eosinophils Absolute: 0.2 10*3/uL (ref 0.0–0.7)
Eosinophils Relative: 1.8 % (ref 0.0–5.0)
HCT: 36.7 % (ref 36.0–46.0)
Hemoglobin: 12 g/dL (ref 12.0–15.0)
Lymphocytes Relative: 38.7 % (ref 12.0–46.0)
Lymphs Abs: 5.3 10*3/uL — ABNORMAL HIGH (ref 0.7–4.0)
MCHC: 32.6 g/dL (ref 30.0–36.0)
MCV: 95 fl (ref 78.0–100.0)
Monocytes Absolute: 0.8 10*3/uL (ref 0.1–1.0)
Monocytes Relative: 5.9 % (ref 3.0–12.0)
Neutro Abs: 7.3 10*3/uL (ref 1.4–7.7)
Neutrophils Relative %: 53.1 % (ref 43.0–77.0)
Platelets: 487 10*3/uL — ABNORMAL HIGH (ref 150.0–400.0)
RBC: 3.86 Mil/uL — ABNORMAL LOW (ref 3.87–5.11)
RDW: 15.1 % (ref 11.5–15.5)
WBC: 13.8 10*3/uL — ABNORMAL HIGH (ref 4.0–10.5)

## 2024-03-02 LAB — COMPREHENSIVE METABOLIC PANEL WITH GFR
ALT: 12 U/L (ref 0–35)
AST: 10 U/L (ref 0–37)
Albumin: 3.7 g/dL (ref 3.5–5.2)
Alkaline Phosphatase: 74 U/L (ref 39–117)
BUN: 24 mg/dL — ABNORMAL HIGH (ref 6–23)
CO2: 26 meq/L (ref 19–32)
Calcium: 8.8 mg/dL (ref 8.4–10.5)
Chloride: 106 meq/L (ref 96–112)
Creatinine, Ser: 1.56 mg/dL — ABNORMAL HIGH (ref 0.40–1.20)
GFR: 37.81 mL/min — ABNORMAL LOW (ref >60.00)
Glucose, Bld: 101 mg/dL — ABNORMAL HIGH (ref 70–99)
Potassium: 3.6 meq/L (ref 3.5–5.1)
Sodium: 139 meq/L (ref 135–145)
Total Bilirubin: 0.2 mg/dL (ref 0.2–1.2)
Total Protein: 6.4 g/dL (ref 6.0–8.3)

## 2024-03-02 LAB — SEDIMENTATION RATE: Sed Rate: 33 mm/h — ABNORMAL HIGH (ref 0–30)

## 2024-03-02 LAB — LIPASE: Lipase: 122 U/L — ABNORMAL HIGH (ref 11.0–59.0)

## 2024-03-02 MED ORDER — AMOXICILLIN-POT CLAVULANATE 875-125 MG PO TABS: 1.0000 | ORAL_TABLET | Freq: Two times a day (BID) | ORAL | 0 refills | Status: DC

## 2024-03-02 MED ORDER — PREDNISONE 10 MG PO TABS
ORAL_TABLET | ORAL | 1 refills | Status: DC
Start: 2024-03-02 — End: 2024-03-17

## 2024-03-02 MED ORDER — ONDANSETRON HCL 4 MG PO TABS: 4.0000 mg | ORAL_TABLET | Freq: Three times a day (TID) | ORAL | 0 refills | Status: DC | PRN

## 2024-03-02 MED ORDER — HYDROCODONE-ACETAMINOPHEN 7.5-325 MG PO TABS: 1.0000 | ORAL_TABLET | Freq: Three times a day (TID) | ORAL | 0 refills | Status: DC | PRN

## 2024-03-02 NOTE — Progress Notes (Addendum)
 Subjective:  Patient ID: Vanessa Payne, female    DOB: 06-04-1971  Age: 53 y.o. MRN: [id number]  CC: Abdominal Pain (Upper abdominal pain x 6 days)   HPI Vanessa Payne presents for severe upper abd pain 8-9/10 better w/standing and supine, nausea, chills, sweats; worse w/food She does not drink, no drug use A recent car trip to [place] C/o ongoing L neck/L shoulder pain - severe  Outpatient Medications Prior to Visit  Medication Sig Dispense Refill   albuterol  (VENTOLIN  HFA) 108 (90 Base) MCG/ACT inhaler Inhale 2 puffs into the lungs every 6 (six) hours as needed for wheezing or shortness of breath. 8 g 11   ALPRAZolam  (XANAX ) 0.25 MG tablet Take 1 tablet (0.25 mg total) by mouth 2 (two) times daily as needed for anxiety. 60 tablet 2   butalbital -acetaminophen -caffeine  (FIORICET ) 50-325-40 MG tablet TAKE 1 TABLET BY MOUTH 3 (THREE) TIMES DAILY AS NEEDED FOR HEADACHE. 90 tablet 1   Cholecalciferol (VITAMIN D3) 5000 UNITS CAPS Take 1 capsule by mouth daily.     cyanocobalamin  (VITAMIN B12) 1000 MCG/ML injection Inject 1 mL (1,000 mcg total) into the muscle every 14 (fourteen) days. 6 mL 5   furosemide  (LASIX ) 20 MG tablet Take 1 tablet (20 mg total) by mouth daily. 30 tablet 5   gabapentin  (NEURONTIN ) 300 MG capsule TAKE 2 CAPSULES BY MOUTH 3 TIMES A DAY AS NEEDED 180 capsule 5   meloxicam  (MOBIC ) 15 MG tablet TAKE 1 TABLET BY MOUTH EVERY DAY AS NEEDED FOR PAIN 90 tablet 1   methocarbamol  (ROBAXIN ) 500 MG tablet Take 1 tablet (500 mg total) by mouth every 8 (eight) hours as needed for muscle spasms. 90 tablet 0   mirtazapine  (REMERON ) 30 MG tablet TAKE 0.5-1 TABLETS (15-30 MG TOTAL) BY MOUTH AT BEDTIME. TAKE 1 TABLET BY MOUTH EVERY DAY AT BEDTIME 90 tablet 2   pantoprazole  (PROTONIX ) 40 MG tablet Take 1 tablet (40 mg total) by mouth 2 (two) times daily. 180 tablet 3   topiramate  (TOPAMAX ) 100 MG tablet Take 1 tablet (100 mg total) by mouth daily. 90 tablet 1   venlafaxine  XR (EFFEXOR -XR)  150 MG 24 hr capsule Take 1 capsule (150 mg total) by mouth daily with breakfast. 90 capsule 1   predniSONE  (DELTASONE ) 10 MG tablet Prednisone  10 mg: take 4 tabs a day x 3 days; then 3 tabs a day x 4 days; then 2 tabs a day x 4 days, then 1 tab a day x 6 days, then stop. Take pc. 38 tablet 1   No facility-administered medications prior to visit.    ROS: Review of Systems  Constitutional:  Positive for chills, diaphoresis and fatigue. Negative for activity change, appetite change and unexpected weight change.  HENT:  Negative for congestion, mouth sores and sinus pressure.   Eyes:  Negative for visual disturbance.  Respiratory:  Negative for cough and chest tightness.   Gastrointestinal:  Positive for abdominal distention, abdominal pain and nausea. Negative for constipation, diarrhea and vomiting.  Genitourinary:  Negative for difficulty urinating, frequency, urgency and vaginal pain.  Musculoskeletal:  Positive for arthralgias, neck pain and neck stiffness. Negative for back pain and gait problem.  Skin:  Negative for pallor and rash.  Neurological:  Negative for dizziness, tremors, weakness, numbness and headaches.  Hematological:  Does not bruise/bleed easily.  Psychiatric/Behavioral:  Negative for confusion, self-injury and suicidal ideas. The patient is nervous/anxious.     Objective:  BP 122/82   Pulse (!) 107  Temp 98.1 F (36.7 C) (Oral)   Ht 5\' 5"  (1.651 m)   Wt 125 lb (56.7 kg)   SpO2 92%   BMI 20.80 kg/m   BP Readings from Last 3 Encounters:  03/02/24 122/82  02/02/24 130/84  12/23/23 110/68    Wt Readings from Last 3 Encounters:  03/02/24 125 lb (56.7 kg)  12/23/23 123 lb (55.8 kg)  09/28/23 124 lb (56.2 kg)    Physical Exam Constitutional:      General: She is not in acute distress.    Appearance: She is well-developed. She is not ill-appearing or toxic-appearing.  HENT:     Head: Normocephalic.     Right Ear: External ear normal.     Left Ear:  External ear normal.     Nose: Nose normal.  Eyes:     General:        Right eye: No discharge.        Left eye: No discharge.     Conjunctiva/sclera: Conjunctivae normal.     Pupils: Pupils are equal, round, and reactive to light.  Neck:     Thyroid : No thyromegaly.     Vascular: No JVD.     Trachea: No tracheal deviation.  Cardiovascular:     Rate and Rhythm: Normal rate and regular rhythm.     Heart sounds: Normal heart sounds.  Pulmonary:     Effort: No respiratory distress.     Breath sounds: No stridor. No wheezing.  Abdominal:     General: Bowel sounds are normal. There is distension and abdominal bruit.     Palpations: Abdomen is soft. There is no mass.     Tenderness: There is no abdominal tenderness. There is no guarding or rebound.  Musculoskeletal:        General: No tenderness.     Cervical back: Normal range of motion and neck supple. No rigidity.  Lymphadenopathy:     Cervical: No cervical adenopathy.  Skin:    Findings: No erythema or rash.  Neurological:     Cranial Nerves: No cranial nerve deficit.     Motor: No abnormal muscle tone.     Coordination: Coordination normal.     Deep Tendon Reflexes: Reflexes normal.  Psychiatric:        Behavior: Behavior normal.        Thought Content: Thought content normal.        Judgment: Judgment normal.    Mild upper abd distention Upper abd is tender GB area is NT No rebound  Lab Results  Component Value Date   WBC 13.8 (H) 03/02/2024   HGB 12.0 03/02/2024   HCT 36.7 03/02/2024   PLT 487.0 (H) 03/02/2024   GLUCOSE 101 (H) 03/02/2024   CHOL 299 (H) 03/24/2023   TRIG 245.0 (H) 03/24/2023   HDL 55.50 03/24/2023   LDLDIRECT 205.0 03/24/2023   LDLCALC 126 (H) 03/07/2019   ALT 12 03/02/2024   AST 10 03/02/2024   NA 139 03/02/2024   K 3.6 03/02/2024   CL 106 03/02/2024   CREATININE 1.56 (H) 03/02/2024   BUN 24 (H) 03/02/2024   CO2 26 03/02/2024   TSH 2.98 03/24/2023    MR Cervical Spine Wo  Contrast Result Date: 02/16/2024 CLINICAL DATA:  Neck and left arm and hand pain with paresthesias. EXAM: MRI CERVICAL SPINE WITHOUT CONTRAST TECHNIQUE: Multiplanar, multisequence MR imaging of the cervical spine was performed. No intravenous contrast was administered. COMPARISON:  CT cervical spine from 2022 FINDINGS: Alignment: Physiologic.  Vertebrae: No fracture, evidence of discitis, or bone lesion. Cord: Normal signal and morphology. Posterior Fossa, vertebral arteries, paraspinal tissues: No significant findings. Disc levels: C2-3: No significant findings. C3-4: Shallow right-sided disc osteophyte complex with mild impression on the right side of the thecal sac but no significant spinal or foraminal stenosis. C4-5 no significant findings. C5-6: Bulging annulus and mild osteophytic ridging with mild flattening of the ventral thecal sac and mild narrowing of the ventral CSF space. There is also mild right foraminal encroachment. No significant spinal or foraminal stenosis. C6-7: Moderate degenerative disc disease with disc space narrowing and osteophytic ridging. There is a bulging degenerated annulus and uncinate spurring changes. Flattening of the ventral thecal sac and narrowing the ventral CSF space but no significant spinal stenosis. There is mild foraminal stenosis bilaterally. C7-T1: No significant findings. IMPRESSION: 1. Shallow right-sided disc osteophyte complex at C3-4 with mild impression on the right side of the thecal sac but no significant spinal or foraminal stenosis. 2. Bulging annulus and mild osteophytic ridging at C5-6 with mild flattening of the ventral thecal sac and mild narrowing of the ventral CSF space. There is also mild right foraminal encroachment. 3. Moderate degenerative disc disease at C6-7 with disc space narrowing and osteophytic ridging. There is a bulging degenerated annulus and uncinate spurring changes. Flattening of the ventral thecal sac and narrowing the ventral CSF  space but no significant spinal stenosis. There is mild foraminal stenosis bilaterally. Electronically Signed   By: Marrian Siva M.D.   On: 02/16/2024 19:24    Assessment & Plan:   Problem List Items Addressed This Visit     Abdominal pain - Primary   New severe upper abd pain 8-9/10 better w/standing and supine, nausea, chills, sweats; worse w/food She does not drink, no drug use R/o pancreatitis, cholecystitis H/o appendectomy Labs - lipase, CBC, CMET Abd US  or CT Protonix  bid To ER if worse Norco po, Zofran , empiric Augmentin      Relevant Orders   Comprehensive metabolic panel with GFR (Completed)   CBC with Differential/Platelet (Completed)   Sedimentation rate (Completed)   Urinalysis (Completed)   Lipase (Completed)   MR ABDOMEN WITH MRCP W CONTRAST   Cervical radiculopathy   NS ref was made Repeat steroids Norco prn  Potential benefits of a short/long term opioids use as well as potential risks (i.e. addiction risk, apnea etc) and complications (i.e. Somnolence, constipation and others) were explained to the patient and were aknowledged.       Pancreatitis, acute   New severe upper abd pain 8-9/10 better w/standing and supine, nausea, chills, sweats; worse w/food She does not drink, no drug use. The etiology is unclear. Prescribed pancreatic enzymes.  Fast for a couple days.  Continue with good hydration Obtain abdominal MR ERCP Go to ER if worse       Relevant Medications   HYDROcodone -acetaminophen  (NORCO) 7.5-325 MG tablet   Pancrelipase, Lip-Prot-Amyl, (ZENPEP) 60000-189600 units CPEP   Other Relevant Orders   MR ABDOMEN WITH MRCP W CONTRAST      Meds ordered this encounter  Medications   predniSONE  (DELTASONE ) 10 MG tablet    Sig: Prednisone  10 mg: take 4 tabs a day x 3 days; then 3 tabs a day x 4 days; then 2 tabs a day x 4 days, then 1 tab a day x 6 days, then stop. Take pc.    Dispense:  38 tablet    Refill:  1   amoxicillin-clavulanate  (AUGMENTIN) 875-125 MG  tablet    Sig: Take 1 tablet by mouth 2 (two) times daily.    Dispense:  20 tablet    Refill:  0   HYDROcodone -acetaminophen  (NORCO) 7.5-325 MG tablet    Sig: Take 1 tablet by mouth 3 (three) times daily as needed for moderate pain (pain score 4-6).    Dispense:  60 tablet    Refill:  0   ondansetron  (ZOFRAN ) 4 MG tablet    Sig: Take 1 tablet (4 mg total) by mouth every 8 (eight) hours as needed for nausea or vomiting.    Dispense:  20 tablet    Refill:  0   Pancrelipase, Lip-Prot-Amyl, (ZENPEP) 60000-189600 units CPEP    Sig: Take 1 capsule by mouth 3 (three) times daily with meals.    Dispense:  90 capsule    Refill:  3      Follow-up: Return in about 1 week (around 03/09/2024) for a follow-up visit.  Anitra Barn, MD

## 2024-03-02 NOTE — Telephone Encounter (Signed)
 The patient has an appointment to see me today.  Thanks

## 2024-03-02 NOTE — Assessment & Plan Note (Addendum)
 New severe upper abd pain 8-9/10 better w/standing and supine, nausea, chills, sweats; worse w/food She does not drink, no drug use R/o pancreatitis, cholecystitis H/o appendectomy Labs - lipase, CBC, CMET Abd US  or CT Protonix  bid To ER if worse Norco po, Zofran , empiric Augmentin

## 2024-03-02 NOTE — Assessment & Plan Note (Signed)
 NS ref was made Repeat steroids Norco prn  Potential benefits of a short/long term opioids use as well as potential risks (i.e. addiction risk, apnea etc) and complications (i.e. Somnolence, constipation and others) were explained to the patient and were aknowledged.

## 2024-03-03 ENCOUNTER — Ambulatory Visit: Payer: Self-pay | Admitting: Internal Medicine

## 2024-03-03 ENCOUNTER — Telehealth: Payer: Self-pay | Admitting: Internal Medicine

## 2024-03-03 ENCOUNTER — Inpatient Hospital Stay: Admitting: Nurse Practitioner

## 2024-03-03 DIAGNOSIS — K859 Acute pancreatitis without necrosis or infection, unspecified: Secondary | ICD-10-CM | POA: Insufficient documentation

## 2024-03-03 MED ORDER — ZENPEP 60000-189600 UNITS PO CPEP
1.0000 | ORAL_CAPSULE | Freq: Three times a day (TID) | ORAL | 3 refills | Status: DC
Start: 2024-03-03 — End: 2024-09-04

## 2024-03-03 NOTE — Addendum Note (Signed)
 Addended by: Cicily Bonano V on: 03/03/2024 10:42 AM   Modules accepted: Orders, Level of Service

## 2024-03-03 NOTE — Telephone Encounter (Unsigned)
 Copied from CRM [id number]. Topic: Clinical - Lab/Test Results >> Mar 03, 2024  8:29 AM Elle L wrote: Reason for CRM: The patient states she received her lab results on MyChart and she is concerned. However, she states Dr. Georgia Kipper is out of the office today and she is requesting to see if another Provider in the office could review them for her. Her call back number is [phone].

## 2024-03-03 NOTE — Assessment & Plan Note (Addendum)
 New severe upper abd pain 8-9/10 better w/standing and supine, nausea, chills, sweats; worse w/food She does not drink, no drug use. The etiology is unclear. Prescribed pancreatic enzymes.  Fast for a couple days.  Continue with good hydration Obtain abdominal MR ERCP Go to ER if worse

## 2024-03-07 NOTE — Telephone Encounter (Signed)
 Copied from CRM [id number]. Topic: General - Other >> Mar 07, 2024  8:35 AM [name] wrote: Reason for CRM: patient is needing a call back regarding her blood work and medication

## 2024-03-08 NOTE — Telephone Encounter (Signed)
 Being addressed by Dr.Plotnikov in result management note

## 2024-03-14 ENCOUNTER — Telehealth: Payer: Self-pay | Admitting: Internal Medicine

## 2024-03-14 ENCOUNTER — Ambulatory Visit: Payer: Self-pay

## 2024-03-14 ENCOUNTER — Other Ambulatory Visit: Payer: Self-pay

## 2024-03-14 ENCOUNTER — Encounter (HOSPITAL_COMMUNITY): Payer: Self-pay | Admitting: Emergency Medicine

## 2024-03-14 ENCOUNTER — Inpatient Hospital Stay (HOSPITAL_COMMUNITY)
Admission: EM | Admit: 2024-03-14 | Discharge: 2024-03-17 | DRG: 918 | Disposition: A | Discharge status: Home / Self Care | Attending: Internal Medicine | Admitting: Internal Medicine

## 2024-03-14 ENCOUNTER — Other Ambulatory Visit: Payer: Self-pay | Admitting: Internal Medicine

## 2024-03-14 ENCOUNTER — Other Ambulatory Visit: Payer: Self-pay | Admitting: Nurse Practitioner

## 2024-03-14 ENCOUNTER — Emergency Department (HOSPITAL_COMMUNITY)

## 2024-03-14 DIAGNOSIS — M5412 Radiculopathy, cervical region: Secondary | ICD-10-CM | POA: Diagnosis present

## 2024-03-14 DIAGNOSIS — R109 Unspecified abdominal pain: Secondary | ICD-10-CM | POA: Diagnosis present

## 2024-03-14 DIAGNOSIS — R1013 Epigastric pain: Principal | ICD-10-CM

## 2024-03-14 DIAGNOSIS — K259 Gastric ulcer, unspecified as acute or chronic, without hemorrhage or perforation: Secondary | ICD-10-CM

## 2024-03-14 DIAGNOSIS — T380X5A Adverse effect of glucocorticoids and synthetic analogues, initial encounter: Secondary | ICD-10-CM | POA: Diagnosis present

## 2024-03-14 DIAGNOSIS — Z87892 Personal history of anaphylaxis: Secondary | ICD-10-CM

## 2024-03-14 DIAGNOSIS — K922 Gastrointestinal hemorrhage, unspecified: Secondary | ICD-10-CM | POA: Diagnosis present

## 2024-03-14 DIAGNOSIS — Z9049 Acquired absence of other specified parts of digestive tract: Secondary | ICD-10-CM

## 2024-03-14 DIAGNOSIS — T39391A Poisoning by other nonsteroidal anti-inflammatory drugs [NSAID], accidental (unintentional), initial encounter: Principal | ICD-10-CM | POA: Diagnosis present

## 2024-03-14 DIAGNOSIS — G894 Chronic pain syndrome: Secondary | ICD-10-CM | POA: Diagnosis present

## 2024-03-14 DIAGNOSIS — F32A Depression, unspecified: Secondary | ICD-10-CM | POA: Diagnosis present

## 2024-03-14 DIAGNOSIS — D72828 Other elevated white blood cell count: Secondary | ICD-10-CM | POA: Diagnosis present

## 2024-03-14 DIAGNOSIS — Z791 Long term (current) use of non-steroidal anti-inflammatories (NSAID): Secondary | ICD-10-CM

## 2024-03-14 DIAGNOSIS — Z8249 Family history of ischemic heart disease and other diseases of the circulatory system: Secondary | ICD-10-CM

## 2024-03-14 DIAGNOSIS — Z981 Arthrodesis status: Secondary | ICD-10-CM

## 2024-03-14 DIAGNOSIS — F17210 Nicotine dependence, cigarettes, uncomplicated: Secondary | ICD-10-CM | POA: Diagnosis present

## 2024-03-14 DIAGNOSIS — D72829 Elevated white blood cell count, unspecified: Secondary | ICD-10-CM

## 2024-03-14 DIAGNOSIS — G43909 Migraine, unspecified, not intractable, without status migrainosus: Secondary | ICD-10-CM | POA: Diagnosis present

## 2024-03-14 DIAGNOSIS — F419 Anxiety disorder, unspecified: Secondary | ICD-10-CM | POA: Diagnosis present

## 2024-03-14 DIAGNOSIS — Z888 Allergy status to other drugs, medicaments and biological substances status: Secondary | ICD-10-CM

## 2024-03-14 DIAGNOSIS — Z79899 Other long term (current) drug therapy: Secondary | ICD-10-CM

## 2024-03-14 DIAGNOSIS — R935 Abnormal findings on diagnostic imaging of other abdominal regions, including retroperitoneum: Secondary | ICD-10-CM

## 2024-03-14 DIAGNOSIS — K254 Chronic or unspecified gastric ulcer with hemorrhage: Secondary | ICD-10-CM | POA: Diagnosis present

## 2024-03-14 DIAGNOSIS — K2970 Gastritis, unspecified, without bleeding: Secondary | ICD-10-CM

## 2024-03-14 DIAGNOSIS — M1990 Unspecified osteoarthritis, unspecified site: Secondary | ICD-10-CM | POA: Diagnosis present

## 2024-03-14 DIAGNOSIS — K219 Gastro-esophageal reflux disease without esophagitis: Secondary | ICD-10-CM | POA: Diagnosis present

## 2024-03-14 DIAGNOSIS — E538 Deficiency of other specified B group vitamins: Secondary | ICD-10-CM | POA: Diagnosis present

## 2024-03-14 LAB — URINALYSIS, ROUTINE W REFLEX MICROSCOPIC
Bilirubin Urine: NEGATIVE
Glucose, UA: NEGATIVE mg/dL
Hgb urine dipstick: NEGATIVE
Ketones, ur: NEGATIVE mg/dL
Leukocytes,Ua: NEGATIVE
Nitrite: NEGATIVE
Protein, ur: NEGATIVE mg/dL
Specific Gravity, Urine: 1.021 (ref 1.005–1.030)
pH: 5 (ref 5.0–8.0)

## 2024-03-14 LAB — COMPREHENSIVE METABOLIC PANEL WITH GFR
ALT: 25 U/L (ref 0–44)
AST: 10 U/L — ABNORMAL LOW (ref 15–41)
Albumin: 3.3 g/dL — ABNORMAL LOW (ref 3.5–5.0)
Alkaline Phosphatase: 84 U/L (ref 38–126)
Anion gap: 12 (ref 5–15)
BUN: 16 mg/dL (ref 6–20)
CO2: 23 mmol/L (ref 22–32)
Calcium: 9.8 mg/dL (ref 8.9–10.3)
Chloride: 103 mmol/L (ref 98–111)
Creatinine, Ser: 0.97 mg/dL (ref 0.44–1.00)
GFR, Estimated: >60 mL/min (ref >60)
Glucose, Bld: 106 mg/dL — ABNORMAL HIGH (ref 70–99)
Potassium: 4.8 mmol/L (ref 3.5–5.1)
Sodium: 138 mmol/L (ref 135–145)
Total Bilirubin: 0.5 mg/dL (ref 0.0–1.2)
Total Protein: 7 g/dL (ref 6.5–8.1)

## 2024-03-14 LAB — CBC
HCT: 41.5 % (ref 36.0–46.0)
Hemoglobin: 13.1 g/dL (ref 12.0–15.0)
MCH: 31.3 pg (ref 26.0–34.0)
MCHC: 31.6 g/dL (ref 30.0–36.0)
MCV: 99.3 fL (ref 80.0–100.0)
Platelets: 489 10*3/uL — ABNORMAL HIGH (ref 150–400)
RBC: 4.18 MIL/uL (ref 3.87–5.11)
RDW: 14.9 % (ref 11.5–15.5)
WBC: 16.2 10*3/uL — ABNORMAL HIGH (ref 4.0–10.5)
nRBC: 0 % (ref 0.0–0.2)

## 2024-03-14 LAB — LIPASE, BLOOD: Lipase: 62 U/L — ABNORMAL HIGH (ref 11–51)

## 2024-03-14 LAB — HCG, SERUM, QUALITATIVE: Preg, Serum: NEGATIVE

## 2024-03-14 MED ORDER — HYDROMORPHONE HCL 1 MG/ML IJ SOLN
0.5000 mg | Freq: Once | INTRAMUSCULAR | Status: AC
  Administered 2024-03-14: 0.5 mg via INTRAVENOUS
  Filled 2024-03-14: qty 1

## 2024-03-14 MED ORDER — SUCRALFATE 1 G PO TABS
1.0000 g | ORAL_TABLET | Freq: Once | ORAL | Status: AC
  Administered 2024-03-14: 1 g via ORAL
  Filled 2024-03-14: qty 1

## 2024-03-14 MED ORDER — ONDANSETRON HCL 4 MG/2ML IJ SOLN
4.0000 mg | Freq: Once | INTRAMUSCULAR | Status: AC
  Administered 2024-03-14: 4 mg via INTRAVENOUS
  Filled 2024-03-14: qty 2

## 2024-03-14 MED ORDER — LACTATED RINGERS IV BOLUS
1000.0000 mL | Freq: Once | INTRAVENOUS | Status: AC
  Administered 2024-03-14: 1000 mL via INTRAVENOUS

## 2024-03-14 MED ORDER — PANTOPRAZOLE SODIUM 40 MG IV SOLR
40.0000 mg | Freq: Once | INTRAVENOUS | Status: AC
  Administered 2024-03-14: 40 mg via INTRAVENOUS
  Filled 2024-03-14: qty 10

## 2024-03-14 MED ORDER — IOHEXOL 350 MG/ML SOLN
75.0000 mL | Freq: Once | INTRAVENOUS | Status: AC | PRN
Start: 2024-03-14 — End: 2024-03-14
  Administered 2024-03-14: 75 mL via INTRAVENOUS

## 2024-03-14 MED ORDER — MORPHINE SULFATE (PF) 4 MG/ML IV SOLN
4.0000 mg | Freq: Once | INTRAVENOUS | Status: AC
  Administered 2024-03-14: 4 mg via INTRAVENOUS
  Filled 2024-03-14: qty 1

## 2024-03-14 MED ORDER — HYDROMORPHONE HCL 1 MG/ML IJ SOLN
1.0000 mg | Freq: Once | INTRAMUSCULAR | Status: AC
  Administered 2024-03-14: 1 mg via INTRAVENOUS
  Filled 2024-03-14: qty 1

## 2024-03-14 MED ORDER — FAMOTIDINE IN NACL 20-0.9 MG/50ML-% IV SOLN
20.0000 mg | Freq: Once | INTRAVENOUS | Status: AC
  Administered 2024-03-14: 20 mg via INTRAVENOUS
  Filled 2024-03-14: qty 50

## 2024-03-14 NOTE — ED Provider Notes (Signed)
 [place] EMERGENCY DEPARTMENT AT [facility] Provider Note   CSN: [id number] Arrival date & time: 03/14/24  1502     History  No chief complaint on file.   Vanessa Payne is a 53 y.o. female.  HPI Patient is a 53 year old female disease today with complaints of epigastric abdominal pain, left lower quadrant aminal pain x 8 weeks but worse over the last 1 week..  Medical history of long-term ibuprofen use for pain management post injury, arthritis, depression, aortic atherosclerosis.  Notably seen by PCP and was scheduled for an ERCP but was told come to the ED with pain worsened, concern for cholecystitis versus pancreatitis.  Pain is now worse.  Accompanied with nausea and vomiting with abscess with vomiting today.  Denies headache, vision changes, chest pain, shortness of breath, dysuria, hematuria, diarrhea, melena, hematochezia, lower leg swelling.    Home Medications Prior to Admission medications   Medication Sig Start Date End Date Taking? Authorizing Provider  albuterol  (VENTOLIN  HFA) 108 (90 Base) MCG/ACT inhaler Inhale 2 puffs into the lungs every 6 (six) hours as needed for wheezing or shortness of breath. 03/24/23   Plotnikov, Aleksei V, MD  ALPRAZolam  (XANAX ) 0.25 MG tablet Take 1 tablet (0.25 mg total) by mouth 2 (two) times daily as needed for anxiety. 12/23/23   Plotnikov, Aleksei V, MD  amoxicillin -clavulanate (AUGMENTIN ) 875-125 MG tablet Take 1 tablet by mouth 2 (two) times daily. 03/02/24   Plotnikov, Aleksei V, MD  butalbital -acetaminophen -caffeine  (FIORICET ) 50-325-40 MG tablet TAKE 1 TABLET BY MOUTH 3 (THREE) TIMES DAILY AS NEEDED FOR HEADACHE. 02/02/24   Plotnikov, Aleksei V, MD  Cholecalciferol (VITAMIN D3) 5000 UNITS CAPS Take 1 capsule by mouth daily.    [provider]  cyanocobalamin  (VITAMIN B12) 1000 MCG/ML injection Inject 1 mL (1,000 mcg total) into the muscle every 14 (fourteen) days. 09/28/23   Plotnikov, Oakley Bellman, MD  furosemide   (LASIX ) 20 MG tablet Take 1 tablet (20 mg total) by mouth daily. 03/24/23   Plotnikov, Oakley Bellman, MD  gabapentin  (NEURONTIN ) 300 MG capsule TAKE 2 CAPSULES BY MOUTH 3 TIMES A DAY AS NEEDED 12/23/23   Plotnikov, Aleksei V, MD  HYDROcodone -acetaminophen  (NORCO) 7.5-325 MG tablet Take 1 tablet by mouth 3 (three) times daily as needed for moderate pain (pain score 4-6). 03/02/24   Plotnikov, Oakley Bellman, MD  meloxicam  (MOBIC ) 15 MG tablet TAKE 1 TABLET BY MOUTH EVERY DAY AS NEEDED FOR PAIN 12/23/23   Plotnikov, Aleksei V, MD  methocarbamol  (ROBAXIN ) 500 MG tablet TAKE 1 TABLET BY MOUTH EVERY 8 HOURS AS NEEDED FOR MUSCLE SPASMS. 03/14/24   Webb, Padonda B, FNP  mirtazapine  (REMERON ) 30 MG tablet TAKE 0.5-1 TABLETS (15-30 MG TOTAL) BY MOUTH AT BEDTIME. TAKE 1 TABLET BY MOUTH EVERY DAY AT BEDTIME 07/07/23   Plotnikov, Aleksei V, MD  ondansetron  (ZOFRAN ) 4 MG tablet Take 1 tablet (4 mg total) by mouth every 8 (eight) hours as needed for nausea or vomiting. 03/02/24   Plotnikov, Oakley Bellman, MD  Pancrelipase , Lip-Prot-Amyl, (ZENPEP ) 60000-189600 units CPEP Take 1 capsule by mouth 3 (three) times daily with meals. 03/03/24   Plotnikov, Aleksei V, MD  pantoprazole  (PROTONIX ) 40 MG tablet Take 1 tablet (40 mg total) by mouth 2 (two) times daily. 09/28/23   Plotnikov, Oakley Bellman, MD  predniSONE  (DELTASONE ) 10 MG tablet Prednisone  10 mg: take 4 tabs a day x 3 days; then 3 tabs a day x 4 days; then 2 tabs a day x 4 days, then 1  tab a day x 6 days, then stop. Take pc. 03/02/24   Plotnikov, Oakley Bellman, MD  topiramate  (TOPAMAX ) 100 MG tablet Take 1 tablet (100 mg total) by mouth daily. 12/23/23   Plotnikov, Aleksei V, MD  venlafaxine  XR (EFFEXOR -XR) 150 MG 24 hr capsule Take 1 capsule (150 mg total) by mouth daily with breakfast. 12/23/23   Plotnikov, Oakley Bellman, MD      Allergies    Ketorolac tromethamine    Review of Systems   Review of Systems  Gastrointestinal:  Positive for abdominal pain, nausea and vomiting.  All other  systems reviewed and are negative.   Physical Exam Updated Vital Signs BP 122/76   Pulse 69   Temp 98.6 F (37 C)   Resp 18   Wt 56.7 kg   SpO2 94%   BMI 20.80 kg/m  Physical Exam Vitals and nursing note reviewed.  Constitutional:      General: She is not in acute distress.    Appearance: Normal appearance. She is not ill-appearing or diaphoretic.  HENT:     Head: Normocephalic and atraumatic.  Eyes:     General: No scleral icterus.       Right eye: No discharge.        Left eye: No discharge.     Extraocular Movements: Extraocular movements intact.     Conjunctiva/sclera: Conjunctivae normal.  Cardiovascular:     Rate and Rhythm: Normal rate and regular rhythm.     Pulses: Normal pulses.     Heart sounds: Normal heart sounds. No murmur heard.    No friction rub. No gallop.  Pulmonary:     Effort: Pulmonary effort is normal. No respiratory distress.     Breath sounds: Normal breath sounds. No stridor. No wheezing, rhonchi or rales.  Chest:     Chest wall: No tenderness.  Abdominal:     General: Abdomen is flat.     Palpations: Abdomen is soft.     Tenderness: There is abdominal tenderness in the right upper quadrant, epigastric area and left lower quadrant. There is left CVA tenderness. There is no right CVA tenderness or guarding. Positive signs include Murphy's sign. Negative signs include Rovsing's sign, McBurney's sign and psoas sign.  Musculoskeletal:     Cervical back: Normal range of motion and neck supple. No rigidity or tenderness.     Right lower leg: No edema.     Left lower leg: No edema.  Skin:    General: Skin is warm and dry.     Findings: No bruising, erythema, lesion or rash.  Neurological:     General: No focal deficit present.     Mental Status: She is alert and oriented to person, place, and time. Mental status is at baseline.     Motor: No weakness.     Coordination: Coordination normal.     Gait: Gait normal.  Psychiatric:        Mood and  Affect: Mood normal.     ED Results / Procedures / Treatments   Labs (all labs ordered are listed, but only abnormal results are displayed) Labs Reviewed  LIPASE, BLOOD - Abnormal; Notable for the following components:      Result Value   Lipase 62 (*)    All other components within normal limits  COMPREHENSIVE METABOLIC PANEL WITH GFR - Abnormal; Notable for the following components:   Glucose, Bld 106 (*)    Albumin 3.3 (*)    AST 10 (*)  All other components within normal limits  CBC - Abnormal; Notable for the following components:   WBC 16.2 (*)    Platelets 489 (*)    All other components within normal limits  URINALYSIS, ROUTINE W REFLEX MICROSCOPIC  HCG, SERUM, QUALITATIVE    EKG None  Radiology CT ABDOMEN PELVIS W CONTRAST Result Date: 03/14/2024 EXAM: CT ABDOMEN AND PELVIS WITH CONTRAST 03/14/2024 09:15:31 PM TECHNIQUE: CT of the abdomen and pelvis was performed with the administration of intravenous contrast (75mL iohexol  (OMNIPAQUE ) 350 MG/ML injection). Multiplanar reformatted images are provided for review. Automated exposure control, iterative reconstruction, and/or weight based adjustment of the mA/kV was utilized to reduce the radiation dose to as low as reasonably achievable. COMPARISON: 11/01/2020 CLINICAL HISTORY: LLQ abdominal pain; Pancreatitis, acute, severe. Patient c/o left abdominal pain radiating into left shoulder x 6 weeks. Patient's provider wanted her to go for an MRI for concerns for pancreatitis on Thursday, but patient states she can't wait that long. FINDINGS: LOWER CHEST: No acute abnormality. LIVER: The liver is unremarkable. GALLBLADDER AND BILE DUCTS: Gallbladder is unremarkable. No biliary ductal dilatation. SPLEEN: No acute abnormality. PANCREAS: No peripancreatic inflammatory changes to suggest acute pancreatitis. ADRENAL GLANDS: No acute abnormality. KIDNEYS, URETERS AND BLADDER: No stones in the kidneys or ureters. No hydronephrosis. No  perinephric or periureteral stranding. Urinary bladder is unremarkable. GI AND BOWEL: Stomach demonstrates mild wall thickening/edema in the gastric antrum (image 27), nonspecific/equivocal. Correlate for gastritis or nonvisualized gastric ulcer. Moderate right colonic stool burden. There is no bowel obstruction. Status post appendectomy. PERITONEUM AND RETROPERITONEUM: No ascites. No free air. VASCULATURE: Aorta is normal in caliber. LYMPH NODES: No lymphadenopathy. REPRODUCTIVE ORGANS: No acute abnormality. BONES AND SOFT TISSUES: Status post PLIF at L5-S1. No acute osseous abnormality. No focal soft tissue abnormality. IMPRESSION: 1. No CT evidence of acute pancreatitis. 2. Mild wall thickening/edema in the gastric antrum, nonspecific/equivocal. Correlate for gastritis or nonvisualized gastric ulcer. Electronically signed by: Zadie Herter MD 03/14/2024 09:24 PM EDT RP Workstation: [id number]    Procedures Procedures    Medications Ordered in ED Medications  pantoprazole  (PROTONIX ) injection 40 mg (has no administration in time range)  sucralfate (CARAFATE) tablet 1 g (has no administration in time range)  famotidine (PEPCID) IVPB 20 mg premix (has no administration in time range)  HYDROmorphone  (DILAUDID ) injection 1 mg (has no administration in time range)  lactated ringers  bolus 1,000 mL (0 mLs Intravenous Stopped 03/14/24 2213)  morphine  (PF) 4 MG/ML injection 4 mg (4 mg Intravenous Given 03/14/24 2034)  ondansetron  (ZOFRAN ) injection 4 mg (4 mg Intravenous Given 03/14/24 2034)  HYDROmorphone  (DILAUDID ) injection 0.5 mg (0.5 mg Intravenous Given 03/14/24 2129)  iohexol  (OMNIPAQUE ) 350 MG/ML injection 75 mL (75 mLs Intravenous Contrast Given 03/14/24 2116)    ED Course/ Medical Decision Making/ A&P Clinical Course as of 03/14/24 2246  Tue Mar 14, 2024  2210 Spoke with Dr. Willy Harvest, who said that due to him not having any availability for quite some time in the outpatient setting and with her  pain being on manage at this point combined with her failing outpatient treatment at this time, that they would be willing to round on her tomorrow and possibly discuss EGD for evaluation for possible ulcer.  Medical admit. [CB]    Clinical Course User Index [CB] Hayes Lipps, PA-C  Medical Decision Making Amount and/or Complexity of Data Reviewed Labs: ordered. Radiology: ordered.  Risk Prescription drug management.   This patient is a 53 year old female who presents to the ED for concern of epigastric, right upper quadrant, left lower quadrant abdominal pain x 8 weeks, worse over the last week.  Already scheduled for an ERCP to rule out pancreatitis.  Pain worse today.  Endorses nausea and vomiting as well as long-term NSAID use for pain post surgery and right leg.  Noted to have been feeling outpatient treatment with pain control and copious hydration.  On physical exam, patient is in no acute distress, afebrile, alert and orient x 4, speaking in full sentences, nontachypneic, nontachycardic.  Patient has mild abdominal tenderness noted to palpation over the epigastric, right upper quadrant and left lower quadrant regions of her abdomen.  She also has left-sided CVA tenderness.  Unremarkable exam otherwise.  Patient CT scan showed mild gastric wall thickening likely secondary due to gastritis or possible unseen gastric ulcer.  Spoke with Dr. Willy Harvest who said that with him having very little availability outpatient, noting her pain control still unresolved with both Dilaudid  and morphine , still making an 8 out of 10, and with an elevated white count that he believed that she would be a good candidate to round on tomorrow and discuss possible EGD.  Will provide famotidine, Carafate, Protonix  in the interim.  Spoke with Dr. Yvonne Hering with hospitalist, patient care was then transferred over to hospitalist at that time.  Differential diagnoses prior to  evaluation: The emergent differential diagnosis includes, but is not limited to, cholecystitis, pancreatitis, mesenteric ischemia, diverticulitis, nephrolithiasis, constipation, bowel obstruction, IBD,  ectopic pregnancy, ovarian torsion, PID, . This is not an exhaustive differential.   Past Medical History / Co-morbidities / Social History: Migraine, arthritis, chronic pain post surgery, exertional dyspnea  Additional history: Chart reviewed. Pertinent results include:   Last seen on 03/02/2024 by PCP for epigastric pain.  Was suspicious for appendicitis versus cholecystitis.  Reports that prescribed pancreatic enzymes and fasting for a few days with good hydration.  Ordered MR ERCP  Lab Tests/Imaging studies: I personally interpreted labs/imaging and the pertinent results include:   CBC notes no leukocytosis with white count of 16.2 and elevated platelets of 489 Lipase elevated at 62 CMP shows a mild decreased AST of 10 but otherwise unremarkable hCG qualitative negative UA negative CT abdomen shows some mild gastric wall thickening likely secondary to gastritis or unseen gastric ulcer.  I agree with the radiologist interpretation.  Medications: I ordered medication including Dilaudid , morphine , Zofran , LR.  I have reviewed the patients home medicines and have made adjustments as needed.  Social Determinants of Health: None  Disposition: After consideration of the diagnostic results and the patients response to treatment, I feel that the patient would benefit from admission, GI rounding tomorrow, patient care transferred over to Dr. Yvonne Hering.    Final Clinical Impression(s) / ED Diagnoses Final diagnoses:  Epigastric pain  Leukocytosis, unspecified type    Rx / DC Orders ED Discharge Orders     None         Vevelyn Gowers 03/14/24 2246    Burnette Carte, MD 03/15/24 [phone]

## 2024-03-14 NOTE — ED Notes (Signed)
 Pt to CT
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
## 2024-03-14 NOTE — Telephone Encounter (Signed)
 Copied from CRM [id number]. Topic: Clinical - Medical Advice >> Mar 14, 2024  1:04 PM Vanessa Payne wrote: Reason for CRM: Patient wanted to know what she needs to do after her MRI appointment on Thursday and whether or not she needs to make a follow-up appointment with Dr. Georgia Kipper or a specialist.   Patient wants to be prepared and make sure that there's nothing else left to do while waiting for MRI results.

## 2024-03-14 NOTE — ED Triage Notes (Signed)
 Patient c/o left abdominal pain radiating into left shoulder x 6 weeks.  Patient's provider wanted her to go for an MRI for concerns for pancreatitis on Thursday, but patient states she can't wait that long. Patient gives verbal consent for MSE.

## 2024-03-14 NOTE — Telephone Encounter (Signed)
 Mychart message that pt sent has been forwarded to pcp for review, please see that encounter

## 2024-03-14 NOTE — Telephone Encounter (Signed)
 Copied from CRM [id number]. Topic: Clinical - Red Word Triage >> Mar 14, 2024  1:07 PM [place] J wrote: [facility] that prompted transfer to Nurse Triage: Patient's pain from pancreatitis has worsened in pain. She would like pain medication if possible-- per Dr. Tami Falcon orders on her test results on the 23rd, he did recommend she goes to the ER for worsening pain.  Chief Complaint: Abdominal pain Symptoms: Sweating, nausea, vomiting Frequency: 1-2 weeks, worsening Pertinent Negatives: Patient denies relief Disposition: [x] ED /[] Urgent Care (no appt availability in office) / [] Appointment(In office/virtual)/ []  [facility]/ [] Home Care/ [] Refused Recommended Disposition /[] [facility]/ []  Follow-up with PCP Additional Notes: Patient called in to report severe abdominal pain. Patient stated pain is excruciating and has been worsening since her OV on 03/02/24. Patient reported sweating, nausea and vomiting over the past week. Advised ED at this time. Patient verbalized understanding. Patient requested a response from her PCP to a MyChart message that was sent last week. Patient is also requested additional medication to be called in because the hydrocodone  is not helping. Please advise.   Reason for Disposition  [1] SEVERE pain (e.g., excruciating) AND [2] present > 1 hour  Answer Assessment - Initial Assessment Questions 1. LOCATION: "Where does it hurt?"      Left side of abdomin, side to side between hips 2. RADIATION: "Does the pain shoot anywhere else?" (e.g., chest, back)     Chest and back  3. ONSET: "When did the pain begin?" (e.g., minutes, hours or days ago)      Worsening since last OV on 03/02/24 5. PATTERN "Does the pain come and go, or is it constant?"    - If it comes and goes: "How long does it last?" "Do you have pain now?"     (Note: Comes and goes means the pain is intermittent. It goes away completely between bouts.)    - If constant: "Is it getting better,  staying the same, or getting worse?"      (Note: Constant means the pain never goes away completely; most serious pain is constant and gets worse.)      Constant 6. SEVERITY: "How bad is the pain?"  (e.g., Scale 1-10; mild, moderate, or severe)    - MILD (1-3): Doesn't interfere with normal activities, abdomen soft and not tender to touch.     - MODERATE (4-7): Interferes with normal activities or awakens from sleep, abdomen tender to touch.     - SEVERE (8-10): Excruciating pain, doubled over, unable to do any normal activities.       Rates pain a 10 7. RECURRENT SYMPTOM: "Have you ever had this type of stomach pain before?" If Yes, ask: "When was the last time?" and "What happened that time?"      States pain has progressively gotten worse over past week and a half 8. CAUSE: "What do you think is causing the stomach pain?"     Pancreatitis  9. RELIEVING/AGGRAVATING FACTORS: "What makes it better or worse?" (e.g., antacids, bending or twisting motion, bowel movement)     States hydrocodone  is not helping, states eating or fasting does not help, states "lying in fetal position" seems to help 10. OTHER SYMPTOMS: "Do you have any other symptoms?" (e.g., back pain, diarrhea, fever, urination pain, vomiting)     States shoulder and neck pain is related to pancreatitis, states she is in a full sweat because she is in so much pain, nausea and vomiting over the  course of the past week  Protocols used: Abdominal Pain - Female-A-AH

## 2024-03-15 DIAGNOSIS — R109 Unspecified abdominal pain: Secondary | ICD-10-CM | POA: Diagnosis not present

## 2024-03-15 DIAGNOSIS — R933 Abnormal findings on diagnostic imaging of other parts of digestive tract: Secondary | ICD-10-CM | POA: Diagnosis not present

## 2024-03-15 DIAGNOSIS — K922 Gastrointestinal hemorrhage, unspecified: Secondary | ICD-10-CM | POA: Diagnosis present

## 2024-03-15 DIAGNOSIS — Z981 Arthrodesis status: Secondary | ICD-10-CM | POA: Diagnosis not present

## 2024-03-15 DIAGNOSIS — G43909 Migraine, unspecified, not intractable, without status migrainosus: Secondary | ICD-10-CM | POA: Diagnosis present

## 2024-03-15 DIAGNOSIS — T380X5A Adverse effect of glucocorticoids and synthetic analogues, initial encounter: Secondary | ICD-10-CM | POA: Diagnosis present

## 2024-03-15 DIAGNOSIS — R110 Nausea: Secondary | ICD-10-CM

## 2024-03-15 DIAGNOSIS — F418 Other specified anxiety disorders: Secondary | ICD-10-CM | POA: Diagnosis not present

## 2024-03-15 DIAGNOSIS — R748 Abnormal levels of other serum enzymes: Secondary | ICD-10-CM

## 2024-03-15 DIAGNOSIS — F32A Depression, unspecified: Secondary | ICD-10-CM | POA: Diagnosis present

## 2024-03-15 DIAGNOSIS — K219 Gastro-esophageal reflux disease without esophagitis: Secondary | ICD-10-CM | POA: Diagnosis present

## 2024-03-15 DIAGNOSIS — Z791 Long term (current) use of non-steroidal anti-inflammatories (NSAID): Secondary | ICD-10-CM | POA: Diagnosis not present

## 2024-03-15 DIAGNOSIS — R634 Abnormal weight loss: Secondary | ICD-10-CM

## 2024-03-15 DIAGNOSIS — Z9049 Acquired absence of other specified parts of digestive tract: Secondary | ICD-10-CM | POA: Diagnosis not present

## 2024-03-15 DIAGNOSIS — T39391A Poisoning by other nonsteroidal anti-inflammatory drugs [NSAID], accidental (unintentional), initial encounter: Secondary | ICD-10-CM | POA: Diagnosis present

## 2024-03-15 DIAGNOSIS — M1990 Unspecified osteoarthritis, unspecified site: Secondary | ICD-10-CM | POA: Diagnosis present

## 2024-03-15 DIAGNOSIS — E538 Deficiency of other specified B group vitamins: Secondary | ICD-10-CM | POA: Diagnosis present

## 2024-03-15 DIAGNOSIS — D72829 Elevated white blood cell count, unspecified: Secondary | ICD-10-CM

## 2024-03-15 DIAGNOSIS — Z87892 Personal history of anaphylaxis: Secondary | ICD-10-CM | POA: Diagnosis not present

## 2024-03-15 DIAGNOSIS — F17210 Nicotine dependence, cigarettes, uncomplicated: Secondary | ICD-10-CM | POA: Diagnosis present

## 2024-03-15 DIAGNOSIS — Z8249 Family history of ischemic heart disease and other diseases of the circulatory system: Secondary | ICD-10-CM | POA: Diagnosis not present

## 2024-03-15 DIAGNOSIS — K2970 Gastritis, unspecified, without bleeding: Secondary | ICD-10-CM | POA: Diagnosis present

## 2024-03-15 DIAGNOSIS — K259 Gastric ulcer, unspecified as acute or chronic, without hemorrhage or perforation: Secondary | ICD-10-CM | POA: Diagnosis not present

## 2024-03-15 DIAGNOSIS — K253 Acute gastric ulcer without hemorrhage or perforation: Secondary | ICD-10-CM | POA: Diagnosis not present

## 2024-03-15 DIAGNOSIS — R935 Abnormal findings on diagnostic imaging of other abdominal regions, including retroperitoneum: Secondary | ICD-10-CM | POA: Diagnosis not present

## 2024-03-15 DIAGNOSIS — Z888 Allergy status to other drugs, medicaments and biological substances status: Secondary | ICD-10-CM | POA: Diagnosis not present

## 2024-03-15 DIAGNOSIS — M5412 Radiculopathy, cervical region: Secondary | ICD-10-CM | POA: Diagnosis present

## 2024-03-15 DIAGNOSIS — D72828 Other elevated white blood cell count: Secondary | ICD-10-CM | POA: Diagnosis present

## 2024-03-15 DIAGNOSIS — R1013 Epigastric pain: Secondary | ICD-10-CM | POA: Diagnosis present

## 2024-03-15 DIAGNOSIS — Z79899 Other long term (current) drug therapy: Secondary | ICD-10-CM | POA: Diagnosis not present

## 2024-03-15 DIAGNOSIS — G894 Chronic pain syndrome: Secondary | ICD-10-CM | POA: Diagnosis present

## 2024-03-15 DIAGNOSIS — F419 Anxiety disorder, unspecified: Secondary | ICD-10-CM | POA: Diagnosis present

## 2024-03-15 LAB — CBC
HCT: 41.2 % (ref 36.0–46.0)
Hemoglobin: 12.9 g/dL (ref 12.0–15.0)
MCH: 31.3 pg (ref 26.0–34.0)
MCHC: 31.3 g/dL (ref 30.0–36.0)
MCV: 100 fL (ref 80.0–100.0)
Platelets: 457 10*3/uL — ABNORMAL HIGH (ref 150–400)
RBC: 4.12 MIL/uL (ref 3.87–5.11)
RDW: 14.8 % (ref 11.5–15.5)
WBC: 13.2 10*3/uL — ABNORMAL HIGH (ref 4.0–10.5)
nRBC: 0 % (ref 0.0–0.2)

## 2024-03-15 LAB — COMPREHENSIVE METABOLIC PANEL WITH GFR
ALT: 22 U/L (ref 0–44)
AST: 13 U/L — ABNORMAL LOW (ref 15–41)
Albumin: 3 g/dL — ABNORMAL LOW (ref 3.5–5.0)
Alkaline Phosphatase: 79 U/L (ref 38–126)
Anion gap: 6 (ref 5–15)
BUN: 10 mg/dL (ref 6–20)
CO2: 29 mmol/L (ref 22–32)
Calcium: 9.4 mg/dL (ref 8.9–10.3)
Chloride: 107 mmol/L (ref 98–111)
Creatinine, Ser: 0.86 mg/dL (ref 0.44–1.00)
GFR, Estimated: >60 mL/min (ref >60)
Glucose, Bld: 94 mg/dL (ref 70–99)
Potassium: 4.7 mmol/L (ref 3.5–5.1)
Sodium: 142 mmol/L (ref 135–145)
Total Bilirubin: 0.5 mg/dL (ref 0.0–1.2)
Total Protein: 6.6 g/dL (ref 6.5–8.1)

## 2024-03-15 LAB — HIV ANTIBODY (ROUTINE TESTING W REFLEX): HIV Screen 4th Generation wRfx: NONREACTIVE

## 2024-03-15 MED ORDER — ALBUTEROL SULFATE (2.5 MG/3ML) 0.083% IN NEBU: 2.5000 mg | INHALATION_SOLUTION | Freq: Four times a day (QID) | RESPIRATORY_TRACT | Status: DC | PRN

## 2024-03-15 MED ORDER — ONDANSETRON HCL 4 MG PO TABS: 4.0000 mg | ORAL_TABLET | Freq: Four times a day (QID) | ORAL | Status: DC | PRN

## 2024-03-15 MED ORDER — ONDANSETRON HCL 4 MG/2ML IJ SOLN: 4.0000 mg | Freq: Four times a day (QID) | INTRAMUSCULAR | Status: DC | PRN

## 2024-03-15 MED ORDER — SENNOSIDES-DOCUSATE SODIUM 8.6-50 MG PO TABS: 1.0000 | ORAL_TABLET | Freq: Every evening | ORAL | Status: DC | PRN

## 2024-03-15 MED ORDER — GABAPENTIN 300 MG PO CAPS
600.0000 mg | ORAL_CAPSULE | Freq: Two times a day (BID) | ORAL | Status: DC
  Administered 2024-03-15 – 2024-03-17 (×6): 600 mg via ORAL
  Filled 2024-03-15 (×6): qty 2

## 2024-03-15 MED ORDER — MIRTAZAPINE 15 MG PO TABS
30.0000 mg | ORAL_TABLET | Freq: Every day | ORAL | Status: DC
  Administered 2024-03-15 – 2024-03-16 (×3): 30 mg via ORAL
  Filled 2024-03-15 (×3): qty 2

## 2024-03-15 MED ORDER — HYDROMORPHONE HCL 1 MG/ML IJ SOLN
0.5000 mg | Freq: Four times a day (QID) | INTRAMUSCULAR | Status: DC | PRN
  Administered 2024-03-15 – 2024-03-16 (×7): 0.5 mg via INTRAVENOUS
  Filled 2024-03-15 (×7): qty 0.5

## 2024-03-15 MED ORDER — BUTALBITAL-APAP-CAFFEINE 50-325-40 MG PO TABS
1.0000 | ORAL_TABLET | Freq: Three times a day (TID) | ORAL | Status: DC | PRN
  Administered 2024-03-15 – 2024-03-17 (×5): 1 via ORAL
  Filled 2024-03-15 (×6): qty 1

## 2024-03-15 MED ORDER — ALPRAZOLAM 0.25 MG PO TABS: 0.2500 mg | ORAL_TABLET | Freq: Two times a day (BID) | ORAL | Status: DC | PRN

## 2024-03-15 MED ORDER — ACETAMINOPHEN 325 MG PO TABS
650.0000 mg | ORAL_TABLET | Freq: Four times a day (QID) | ORAL | Status: DC | PRN
  Administered 2024-03-15: 650 mg via ORAL
  Filled 2024-03-15: qty 2

## 2024-03-15 MED ORDER — ACETAMINOPHEN 650 MG RE SUPP: 650.0000 mg | Freq: Four times a day (QID) | RECTAL | Status: DC | PRN

## 2024-03-15 MED ORDER — PANTOPRAZOLE SODIUM 40 MG PO TBEC
40.0000 mg | DELAYED_RELEASE_TABLET | Freq: Two times a day (BID) | ORAL | Status: DC
  Filled 2024-03-15: qty 1

## 2024-03-15 MED ORDER — ENOXAPARIN SODIUM 40 MG/0.4ML IJ SOSY
40.0000 mg | PREFILLED_SYRINGE | Freq: Every day | INTRAMUSCULAR | Status: DC
Start: 2024-03-15 — End: 2024-03-15

## 2024-03-15 MED ORDER — PANCRELIPASE (LIP-PROT-AMYL) 36000-114000 UNITS PO CPEP
72000.0000 [IU] | ORAL_CAPSULE | Freq: Three times a day (TID) | ORAL | Status: DC
  Filled 2024-03-15: qty 2

## 2024-03-15 MED ORDER — FUROSEMIDE 40 MG PO TABS
20.0000 mg | ORAL_TABLET | Freq: Every day | ORAL | Status: DC
  Administered 2024-03-15 – 2024-03-17 (×3): 20 mg via ORAL
  Filled 2024-03-15 (×3): qty 1

## 2024-03-15 MED ORDER — VITAMIN D 25 MCG (1000 UNIT) PO TABS
5000.0000 [IU] | ORAL_TABLET | Freq: Every day | ORAL | Status: DC
  Administered 2024-03-15 – 2024-03-17 (×3): 5000 [IU] via ORAL
  Filled 2024-03-15 (×3): qty 5

## 2024-03-15 MED ORDER — HYDROCODONE-ACETAMINOPHEN 7.5-325 MG PO TABS
1.0000 | ORAL_TABLET | Freq: Three times a day (TID) | ORAL | Status: DC | PRN
  Administered 2024-03-15 – 2024-03-17 (×4): 1 via ORAL
  Filled 2024-03-15 (×5): qty 1

## 2024-03-15 MED ORDER — PANTOPRAZOLE SODIUM 40 MG IV SOLR
40.0000 mg | Freq: Two times a day (BID) | INTRAVENOUS | Status: DC
  Administered 2024-03-15 – 2024-03-16 (×3): 40 mg via INTRAVENOUS
  Filled 2024-03-15 (×3): qty 10

## 2024-03-15 MED ORDER — VENLAFAXINE HCL ER 150 MG PO CP24
150.0000 mg | ORAL_CAPSULE | Freq: Every day | ORAL | Status: DC
  Administered 2024-03-15 – 2024-03-17 (×3): 150 mg via ORAL
  Filled 2024-03-15 (×3): qty 1

## 2024-03-15 MED ORDER — METHOCARBAMOL 500 MG PO TABS
500.0000 mg | ORAL_TABLET | Freq: Three times a day (TID) | ORAL | Status: DC | PRN
  Administered 2024-03-16 (×2): 500 mg via ORAL
  Filled 2024-03-15 (×2): qty 1

## 2024-03-15 NOTE — TOC CM/SW Note (Signed)
 Transition of Care [facility]) - Inpatient Brief Assessment   Patient Details  Name: Vanessa Payne MRN: [id number] Date of Birth: [birth year]  [facility]) CM/SW Contact:    Juliane Och, LCSW Phone Number: 03/15/2024, 9:00 AM   Clinical Narrative:  9:00 AM Per chart review, patient resides at home. Patient has a PCP and insurance. Patient does not have SNF or HH history. Patient received a home rolling walker in 2013. No TOC needs were identified at this time. TOC will continue to follow and be available to assist.  Transition of Care Asessment: Insurance and Status: Insurance coverage has been reviewed Patient has primary care physician: Yes Home environment has been reviewed: Private Residence Prior level of function:: N/A Prior/Current Home Services: No current home services Social Drivers of Health Review: SDOH reviewed no interventions necessary Readmission risk has been reviewed: Yes Transition of care needs: no transition of care needs at this time

## 2024-03-15 NOTE — Plan of Care (Signed)
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
## 2024-03-16 ENCOUNTER — Inpatient Hospital Stay (HOSPITAL_COMMUNITY): Admitting: Anesthesiology

## 2024-03-16 ENCOUNTER — Other Ambulatory Visit

## 2024-03-16 ENCOUNTER — Encounter (HOSPITAL_COMMUNITY)
Admission: EM | Disposition: A | Discharge status: Home / Self Care | Payer: Self-pay | Source: Home / Self Care | Attending: Internal Medicine

## 2024-03-16 DIAGNOSIS — K259 Gastric ulcer, unspecified as acute or chronic, without hemorrhage or perforation: Secondary | ICD-10-CM | POA: Diagnosis not present

## 2024-03-16 DIAGNOSIS — R1013 Epigastric pain: Secondary | ICD-10-CM | POA: Diagnosis not present

## 2024-03-16 DIAGNOSIS — K2970 Gastritis, unspecified, without bleeding: Secondary | ICD-10-CM

## 2024-03-16 DIAGNOSIS — D72829 Elevated white blood cell count, unspecified: Secondary | ICD-10-CM | POA: Diagnosis not present

## 2024-03-16 DIAGNOSIS — F418 Other specified anxiety disorders: Secondary | ICD-10-CM | POA: Diagnosis not present

## 2024-03-16 HISTORY — PX: ESOPHAGOGASTRODUODENOSCOPY: SHX5428

## 2024-03-16 SURGERY — EGD (ESOPHAGOGASTRODUODENOSCOPY)
Anesthesia: Monitor Anesthesia Care

## 2024-03-16 MED ORDER — SODIUM CHLORIDE (PF) 0.9 % IJ SOLN
INTRAMUSCULAR | Status: AC
  Administered 2024-03-16: 10 mL
  Filled 2024-03-16: qty 10

## 2024-03-16 MED ORDER — PROPOFOL 10 MG/ML IV BOLUS
INTRAVENOUS | Status: DC | PRN
  Administered 2024-03-16 (×2): 50 mg via INTRAVENOUS
  Administered 2024-03-16 (×2): 30 mg via INTRAVENOUS

## 2024-03-16 MED ORDER — SODIUM CHLORIDE 0.9 % IV SOLN
INTRAVENOUS | Status: AC | PRN
  Administered 2024-03-16: 500 mL via INTRAVENOUS

## 2024-03-16 MED ORDER — PANTOPRAZOLE SODIUM 40 MG PO TBEC
40.0000 mg | DELAYED_RELEASE_TABLET | Freq: Two times a day (BID) | ORAL | Status: DC
  Administered 2024-03-16 – 2024-03-17 (×2): 40 mg via ORAL
  Filled 2024-03-16 (×2): qty 1

## 2024-03-16 NOTE — Progress Notes (Signed)
 [facility] PROGRESS NOTE    Progress Note  Vanessa Payne  NGE:[id number] DOB: 08-17-1971 DOA: 03/14/2024 PCP: Genia Kettering, MD     Brief Narrative:   Vanessa Payne is an 53 y.o. female past medical history significant for anxiety, depression cervical radiculopathy, vitamin B12 deficiency comes in for worsening abdominal pain that started about 3 weeks prior to admission that was constant worse with eating, CT scan of the abdomen pelvis showed wall thickening and edema of the gastric antrum no evidence of acute pancreatitis started on IV Protonix  GI was consulted She has no alarming symptoms.   Assessment/Plan:   Abdominal pain/history of GERD: In the setting of daily use of ibuprofen and prednisone  for the last 10 days. I was consulted and recommended an EGD on 03/16/2024. Placed n.p.o. continue IV Protonix  twice a day. Narcotics for pain control Zofran  for nausea.  Leukocytosis: Likely secondary to steroids she was prescribed on 03/02/2024. She has remained afebrile, now this morning is improving.  Cervical radiculopathy/chronic back pain: Continue gabapentin .  Anxiety and depression: Continue current medication.  B12 deficiency Continue B12 repletion.   DVT prophylaxis: scd Family Communication:none Status is: Observation The patient remains OBS appropriate and will d/c before 2 midnights.    Code Status:     Code Status Orders  (From admission, onward)           Start     Ordered   03/15/24 0121  Full code  Continuous       Question:  By:  Answer:  Consent: discussion documented in EHR   03/15/24 0120           Code Status History     This patient has a current code status but no historical code status.         IV Access:   Peripheral IV   Procedures and diagnostic studies:   CT ABDOMEN PELVIS W CONTRAST Result Date: 03/14/2024 EXAM: CT ABDOMEN AND PELVIS WITH CONTRAST 03/14/2024 09:15:31 PM TECHNIQUE: CT of the  abdomen and pelvis was performed with the administration of intravenous contrast (75mL iohexol  (OMNIPAQUE ) 350 MG/ML injection). Multiplanar reformatted images are provided for review. Automated exposure control, iterative reconstruction, and/or weight based adjustment of the mA/kV was utilized to reduce the radiation dose to as low as reasonably achievable. COMPARISON: 11/01/2020 CLINICAL HISTORY: LLQ abdominal pain; Pancreatitis, acute, severe. Patient c/o left abdominal pain radiating into left shoulder x 6 weeks. Patient's provider wanted her to go for an MRI for concerns for pancreatitis on Thursday, but patient states she can't wait that long. FINDINGS: LOWER CHEST: No acute abnormality. LIVER: The liver is unremarkable. GALLBLADDER AND BILE DUCTS: Gallbladder is unremarkable. No biliary ductal dilatation. SPLEEN: No acute abnormality. PANCREAS: No peripancreatic inflammatory changes to suggest acute pancreatitis. ADRENAL GLANDS: No acute abnormality. KIDNEYS, URETERS AND BLADDER: No stones in the kidneys or ureters. No hydronephrosis. No perinephric or periureteral stranding. Urinary bladder is unremarkable. GI AND BOWEL: Stomach demonstrates mild wall thickening/edema in the gastric antrum (image 27), nonspecific/equivocal. Correlate for gastritis or nonvisualized gastric ulcer. Moderate right colonic stool burden. There is no bowel obstruction. Status post appendectomy. PERITONEUM AND RETROPERITONEUM: No ascites. No free air. VASCULATURE: Aorta is normal in caliber. LYMPH NODES: No lymphadenopathy. REPRODUCTIVE ORGANS: No acute abnormality. BONES AND SOFT TISSUES: Status post PLIF at L5-S1. No acute osseous abnormality. No focal soft tissue abnormality. IMPRESSION: 1. No CT evidence of acute pancreatitis. 2. Mild wall thickening/edema in the gastric antrum, nonspecific/equivocal. Correlate for  gastritis or nonvisualized gastric ulcer. Electronically signed by: Zadie Herter MD 03/14/2024 09:24 PM EDT RP  Workstation: [id number]     Medical Consultants:   None.   Subjective:    Vanessa Payne still having abdominal pain.  Objective:    Vitals:   03/15/24 0816 03/15/24 1631 03/15/24 1950 03/16/24 0417  BP: 108/79 125/75 124/69 110/68  Pulse: 78 82 76 90  Resp: 18  16 16   Temp: 98 F (36.7 C) 98 F (36.7 C) 98 F (36.7 C) 99.8 F (37.7 C)  TempSrc: Oral  Oral Oral  SpO2: 93% 93% 93% 92%  Weight:      Height:       SpO2: 92 %  No intake or output data in the 24 hours ending 03/16/24 0740  Filed Weights   03/14/24 1525 03/15/24 0000  Weight: 56.7 kg 56 kg    Exam: General exam: In no acute distress. Respiratory system: Good air movement and clear to auscultation. Cardiovascular system: S1 & S2 heard, RRR. No JVD. Gastrointestinal system: Positive bowel sounds soft, with epigastric tenderness no rebound or guarding Extremities: No pedal edema. Skin: No rashes, lesions or ulcers Psychiatry: Judgement and insight appear normal. Mood & affect appropriate.   Data Reviewed:    Labs: Basic Metabolic Panel: Recent Labs  Lab 03/14/24 1531 03/15/24 0512  NA 138 142  K 4.8 4.7  CL 103 107  CO2 23 29  GLUCOSE 106* 94  BUN 16 10  CREATININE 0.97 0.86  CALCIUM 9.8 9.4   GFR Estimated Creatinine Clearance: 66.9 mL/min (by C-G formula based on SCr of 0.86 mg/dL). Liver Function Tests: Recent Labs  Lab 03/14/24 1531 03/15/24 0512  AST 10* 13*  ALT 25 22  ALKPHOS 84 79  BILITOT 0.5 0.5  PROT 7.0 6.6  ALBUMIN 3.3* 3.0*   Recent Labs  Lab 03/14/24 1531  LIPASE 62*   No results for input(s): "AMMONIA" in the last 168 hours. Coagulation profile No results for input(s): "INR", "PROTIME" in the last 168 hours. COVID-19 Labs  No results for input(s): "DDIMER", "FERRITIN", "LDH", "CRP" in the last 72 hours.  Lab Results  Component Value Date   SARSCOV2NAA Not Detected 11/14/2019   SARSCOV2NAA Not Detected 07/20/2019    CBC: Recent Labs  Lab  03/14/24 1531 03/15/24 0512  WBC 16.2* 13.2*  HGB 13.1 12.9  HCT 41.5 41.2  MCV 99.3 100.0  PLT 489* 457*   Cardiac Enzymes: No results for input(s): "CKTOTAL", "CKMB", "CKMBINDEX", "TROPONINI" in the last 168 hours. BNP (last 3 results) No results for input(s): "PROBNP" in the last 8760 hours. CBG: No results for input(s): "GLUCAP" in the last 168 hours. D-Dimer: No results for input(s): "DDIMER" in the last 72 hours. Hgb A1c: No results for input(s): "HGBA1C" in the last 72 hours. Lipid Profile: No results for input(s): "CHOL", "HDL", "LDLCALC", "TRIG", "CHOLHDL", "LDLDIRECT" in the last 72 hours. Thyroid  function studies: No results for input(s): "TSH", "T4TOTAL", "T3FREE", "THYROIDAB" in the last 72 hours.  Invalid input(s): "FREET3" Anemia work up: No results for input(s): "VITAMINB12", "FOLATE", "FERRITIN", "TIBC", "IRON", "RETICCTPCT" in the last 72 hours. Sepsis Labs: Recent Labs  Lab 03/14/24 1531 03/15/24 0512  WBC 16.2* 13.2*   Microbiology No results found for this or any previous visit (from the past 240 hours).   Medications:    cholecalciferol  5,000 Units Oral Daily   furosemide   20 mg Oral Daily   gabapentin   600 mg Oral BID   mirtazapine   30 mg Oral QHS   pantoprazole  (PROTONIX ) IV  40 mg Intravenous Q12H   venlafaxine  XR  150 mg Oral Q breakfast   Continuous Infusions:    LOS: 1 day   Macdonald Savoy  [facility]  03/16/2024, 7:40 AM
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
## 2024-03-16 NOTE — Transfer of Care (Signed)
 Immediate Anesthesia Transfer of Care Note  Patient: Vanessa Payne  Procedure(s) Performed: EGD (ESOPHAGOGASTRODUODENOSCOPY)  Patient Location: PACU  Anesthesia Type:MAC  Level of Consciousness: awake, alert , and oriented  Airway & Oxygen Therapy: Patient Spontanous Breathing  Post-op Assessment: Report given to RN and Post -op Vital signs reviewed and stable  Post vital signs: Reviewed and stable  Last Vitals:  Vitals Value Taken Time  BP 110/86 03/16/24 1052  Temp    Pulse 114 03/16/24 1052  Resp 19 03/16/24 1052  SpO2 96 % 03/16/24 1052    Last Pain:  Vitals:   03/16/24 0924  TempSrc: Temporal  PainSc: 8          Complications: No notable events documented.

## 2024-03-16 NOTE — Progress Notes (Signed)
 Given PRN pain medication this morning for pain radiating from right quadrant of abdomen, quadrant 4 on the right side. Reports pain moving across abdomen with a burning sensation left side of her chest and left shoulder pain. Endoscopy called report given to Nurse and medical consent form signed.

## 2024-03-16 NOTE — Anesthesia Preprocedure Evaluation (Signed)
 Anesthesia Evaluation  Patient identified by MRN, date of birth, ID band Patient awake    Reviewed: Allergy & Precautions, NPO status , Patient's Chart, lab work & pertinent test results  History of Anesthesia Complications Negative for: history of anesthetic complications  Airway Mallampati: II  TM Distance: >3 FB Neck ROM: Full    Dental  (+) Dental Advisory Given, Edentulous Upper,    Pulmonary neg shortness of breath, neg COPD, neg recent URI, Current Smoker and Patient abstained from smoking.   breath sounds clear to auscultation       Cardiovascular negative cardio ROS  Rhythm:Regular     Neuro/Psych  Headaches PSYCHIATRIC DISORDERS Anxiety Depression     Neuromuscular disease    GI/Hepatic Neg liver ROS,GERD  ,,  Endo/Other  negative endocrine ROS    Renal/GU negative Renal ROS     Musculoskeletal  (+) Arthritis ,    Abdominal   Peds  Hematology negative hematology ROS (+) Lab Results      Component                Value               Date                      WBC                      13.2 (H)            03/15/2024                HGB                      12.9                03/15/2024                HCT                      41.2                03/15/2024                MCV                      100.0               03/15/2024                PLT                      457 (H)             03/15/2024              Anesthesia Other Findings   Reproductive/Obstetrics                             Anesthesia Physical Anesthesia Plan  ASA: 2  Anesthesia Plan: MAC   Post-op Pain Management: Minimal or no pain anticipated   Induction: Intravenous  PONV Risk Score and Plan: 1 and Propofol  infusion and Treatment may vary due to age or medical condition  Airway Management Planned: Nasal Cannula, Natural Airway and Simple Face Mask  Additional Equipment: None  Intra-op Plan:    Post-operative Plan:   Informed Consent: I have reviewed the patients  History and Physical, chart, labs and discussed the procedure including the risks, benefits and alternatives for the proposed anesthesia with the patient or authorized representative who has indicated his/her understanding and acceptance.     Dental advisory given  Plan Discussed with: CRNA  Anesthesia Plan Comments:        Anesthesia Quick Evaluation

## 2024-03-16 NOTE — Plan of Care (Signed)

## 2024-03-16 NOTE — Op Note (Signed)
 [facility] Patient Name: Vanessa Payne Procedure Date : 03/16/2024 MRN: [id number] Attending MD: Eugenia Hess , MD, [id number] Date of Birth: July 23, 1971 CSN: [id number] Age: 53 Admit Type: Inpatient Procedure:                Upper GI endoscopy Indications:              Epigastric abdominal pain, Abnormal CT of the GI                            tract, Weight loss Providers:                Eugenia Hess, MD, Bradley Caffey, Gabino Joe, Technician Referring MD:              Medicines:                Monitored Anesthesia Care Complications:            No immediate complications. Estimated blood loss:                            Minimal. Estimated Blood Loss:     Estimated blood loss was minimal. Procedure:                Pre-Anesthesia Assessment:                           - Prior to the procedure, a History and Physical                            was performed, and patient medications and                            allergies were reviewed. The patient's tolerance of                            previous anesthesia was also reviewed. The risks                            and benefits of the procedure and the sedation                            options and risks were discussed with the patient.                            All questions were answered, and informed consent                            was obtained. Prior Anticoagulants: The patient has                            taken no anticoagulant or antiplatelet agents. ASA                            Grade  Assessment: II - A patient with mild systemic                            disease. After reviewing the risks and benefits,                            the patient was deemed in satisfactory condition to                            undergo the procedure.                           After obtaining informed consent, the endoscope was                            passed under direct vision.  Throughout the                            procedure, the patient's blood pressure, pulse, and                            oxygen saturations were monitored continuously. The                            [id number]-[id number] ([id number]) Olympus endoscope was introduced                            through the mouth, and advanced to the second part                            of duodenum. The upper GI endoscopy was                            accomplished without difficulty. The patient                            tolerated the procedure well. Scope In: Scope Out: Findings:      The examined esophagus was normal.      Scattered mild inflammation characterized by adherent blood and erythema       was found in the gastric body and in the gastric antrum. Biopsies were       taken with a cold forceps for Helicobacter pylori testing. The gastric       cardia and fundus were normal in retroflexion.      One non-bleeding cratered gastric ulcer with no stigmata of bleeding was       found in the gastric antrum. The lesion was 10 mm in largest dimension.      A single localized erosion with no bleeding and no stigmata of recent       bleeding was found in the prepyloric region of the stomach.      The duodenal bulb and second portion of the duodenum were normal. Impression:               - Normal esophagus.                           -  Gastritis. Biopsied.                           - Non-bleeding gastric ulcer with no stigmata of                            bleeding. This is likely source of patient's                            abdominal pain. Suspect this is NSAID induced.                            Biopsies performed to evaluate for H. pylori.                           - Gastric erosion with no bleeding and no stigmata                            of recent bleeding.                           - Normal duodenal bulb and second portion of the                            duodenum. Recommendation:           - Return patient  to hospital ward for ongoing care.                           - Await pathology results. If H. pylori is present                            treat with quadruple therapy.                           - Pantoprazole  40 mg p.o. twice daily                           - Start sucralfate 1 g p.o. 3 times daily                           - Indefinite avoidance of NSAIDs -patient will need                            to utilize alternate medications for pain management                           - Patient will need follow-up EGD in outpatient                            setting in 8 to 12 weeks to confirm ulcer healing                           - The findings and recommendations were discussed  with the patient Procedure Code(s):        --- Professional ---                           [phone], Esophagogastroduodenoscopy, flexible,                            transoral; with biopsy, single or multiple Diagnosis Code(s):        --- Professional ---                           K29.70, Gastritis, unspecified, without bleeding                           K25.9, Gastric ulcer, unspecified as acute or                            chronic, without hemorrhage or perforation                           R10.13, Epigastric pain                           R63.4, Abnormal weight loss                           R93.3, Abnormal findings on diagnostic imaging of                            other parts of digestive tract CPT copyright 2022 American [employer] Association. All rights reserved. The codes documented in this report are preliminary and upon coder review may  be revised to meet current compliance requirements. Eugenia Hess, MD 03/16/2024 10:54:47 AM This report has been signed electronically. Number of Addenda: 0

## 2024-03-17 ENCOUNTER — Other Ambulatory Visit: Payer: Self-pay

## 2024-03-17 ENCOUNTER — Ambulatory Visit: Payer: Self-pay | Admitting: Pediatrics

## 2024-03-17 ENCOUNTER — Telehealth: Payer: Self-pay

## 2024-03-17 DIAGNOSIS — K259 Gastric ulcer, unspecified as acute or chronic, without hemorrhage or perforation: Secondary | ICD-10-CM

## 2024-03-17 DIAGNOSIS — R935 Abnormal findings on diagnostic imaging of other abdominal regions, including retroperitoneum: Secondary | ICD-10-CM | POA: Diagnosis not present

## 2024-03-17 DIAGNOSIS — R1013 Epigastric pain: Secondary | ICD-10-CM | POA: Diagnosis not present

## 2024-03-17 DIAGNOSIS — K2940 Chronic atrophic gastritis without bleeding: Secondary | ICD-10-CM

## 2024-03-17 DIAGNOSIS — D72829 Elevated white blood cell count, unspecified: Secondary | ICD-10-CM | POA: Diagnosis not present

## 2024-03-17 DIAGNOSIS — Z791 Long term (current) use of non-steroidal anti-inflammatories (NSAID): Secondary | ICD-10-CM

## 2024-03-17 DIAGNOSIS — K253 Acute gastric ulcer without hemorrhage or perforation: Secondary | ICD-10-CM

## 2024-03-17 DIAGNOSIS — R109 Unspecified abdominal pain: Secondary | ICD-10-CM | POA: Diagnosis not present

## 2024-03-17 LAB — SURGICAL PATHOLOGY

## 2024-03-17 MED ORDER — HYDROCODONE-ACETAMINOPHEN 7.5-325 MG PO TABS: 1.0000 | ORAL_TABLET | Freq: Three times a day (TID) | ORAL | 0 refills | Status: AC | PRN

## 2024-03-17 MED ORDER — PANTOPRAZOLE SODIUM 40 MG PO TBEC: 40.0000 mg | DELAYED_RELEASE_TABLET | Freq: Two times a day (BID) | ORAL | 3 refills | Status: DC

## 2024-03-17 MED ORDER — SUCRALFATE 1 G PO TABS: 1.0000 g | ORAL_TABLET | Freq: Three times a day (TID) | ORAL | 0 refills | Status: DC

## 2024-03-17 NOTE — Telephone Encounter (Signed)
 Lola Czerwonka Koslow                                                                                                          03/17/2024 [address] [place] [address]                                                                                                                             Dear Ms. Bremner,   It was a pleasure seeing you at the time of your recent upper endoscopy at [facility] I am writing to inform you of the pathology results performed during your procedure.   The biopsies showed normal tissue in your stomach without any evidence of H. pylori infection.   Based upon this finding, I recommend that you continue on treatment with pantoprazole  40 mg p.o. twice daily until your follow-up endoscopy in 8 to 12 weeks.  You may take Carafate 1 g p.o. 3 times daily to coat your stomach and alleviate pain.  Please remember to avoid nonsteroidal anti-inflammatory agents.  You can speak with your pain medicine specialist or primary care doctor about other treatments for your pain management.  Our office will coordinate a follow-up visit for you after your hospital discharge as well as an appointment for a follow-up upper endoscopy in 8 to 12 weeks to confirm healing of your ulcer.   Thank you for allowing me to participate in your care and do not hesitate to contact the gastroenterology office at [phone] should you have any questions regarding your results.   Sincerely,   Eugenia Hess, MD

## 2024-03-17 NOTE — Telephone Encounter (Signed)
 Patient is currently admitted to [facility]. Will contact pt for scheduling once discharged.

## 2024-03-17 NOTE — TOC Transition Note (Signed)
 Transition of Care [facility]) - Discharge Note   Patient Details  Name: Vanessa Payne MRN: [id number] Date of Birth: 1971-03-17  Transition of Care [facility]) CM/SW Contact:  Jeani Mill, RN Phone Number: 03/17/2024, 8:47 AM   Clinical Narrative:    Patient stable for discharge.  Patient to follow up with GI in 1 week.  No TOC needs at this time.     Final next level of care: Home/Self Care Barriers to Discharge: Barriers Resolved   Patient Goals and CMS Choice Patient states their goals for this hospitalization and ongoing recovery are:: Return home          Discharge Placement             Home          Discharge Plan and Services Additional resources added to the After Visit Summary for                                       Social Drivers of Health (SDOH) Interventions SDOH Screenings   Food Insecurity: No Food Insecurity (03/15/2024)  Housing: Low Risk  (03/15/2024)  Transportation Needs: No Transportation Needs (03/15/2024)  Utilities: Not At Risk (03/15/2024)  Depression (PHQ2-9): Low Risk  (03/02/2024)  Social Connections: Unknown (02/23/2022)   Received from [facility]  Tobacco Use: High Risk (03/14/2024)     Readmission Risk Interventions    03/17/2024    8:46 AM  Readmission Risk Prevention Plan  Post Dischage Appt Complete  Medication Screening Complete  Transportation Screening Complete

## 2024-03-17 NOTE — Discharge Summary (Signed)
 Physician Discharge Summary  LENNOX LEIKAM GUY:[id number] DOB: 01/14/1971 DOA: 03/14/2024  PCP: Genia Kettering, MD  Admit date: 03/14/2024 Discharge date: 03/17/2024  Admitted From: Home Disposition:  Home  Recommendations for Outpatient Follow-up:  Follow up with GI in 1-2 weeks Please obtain BMP/CBC in one week Please follow up on the following pending results:  Home Health:No Equipment/Devices:None  Discharge Condition:Stable CODE STATUS:Full Diet recommendation: Heart Healthy   Brief/Interim Summary:  53 y.o. female past medical history significant for anxiety, depression cervical radiculopathy, vitamin B12 deficiency comes in for worsening abdominal pain that started about 3 weeks prior to admission that was constant worse with eating, CT scan of the abdomen pelvis showed wall thickening and edema of the gastric antrum no evidence of acute pancreatitis started on IV Protonix  GI was consulted She has no alarming symptoms.    Discharge Diagnoses:  Principal Problem:   Abdominal pain Active Problems:   B12 deficiency   Depression   Cervical radiculopathy   Leukocytosis   GI bleed   Abnormal CT of the abdomen   Gastritis without bleeding   Gastric ulcer without hemorrhage or perforation  Peptic ulcer disease in the setting of NSAID use: She has been using ibuprofen and for several years she was started on prednisone . Came in with abdominal pain CT scan of the abdomen pelvis showed some thickening. She was started on IV Protonix  twice a day GI was consulted. EGD was done 03/16/2024 that showed normal esophagus gastritis nonbleeding ulcer biopsies were taken. She will follow-up with GI as an outpatient continue Flomax twice a day.  Leukocytosis:  Factorial in the setting of steroid use and likely reactive #1.  Cervical radiculopathy: Continue gabapentin  she will follow-up with pain clinic as an outpatient.  Anxiety and depression: Noted.  Vitamin B-12  deficiency: Continue oral repletion.  Discharge Instructions  Discharge Instructions     Diet - low sodium heart healthy   Complete by: As directed    Increase activity slowly   Complete by: As directed       Allergies as of 03/17/2024       Reactions   Ketorolac Tromethamine Anaphylaxis   Tongue swells Pt can take other NSAIDs        Medication List     STOP taking these medications    diclofenac  Sodium 1 % Gel Commonly known as: VOLTAREN    ibuprofen 200 MG tablet Commonly known as: ADVIL   meloxicam  15 MG tablet Commonly known as: MOBIC    methocarbamol  500 MG tablet Commonly known as: ROBAXIN    naproxen  sodium 220 MG tablet Commonly known as: ALEVE    predniSONE  10 MG tablet Commonly known as: DELTASONE        TAKE these medications    acetaminophen -caffeine  500-65 MG Tabs per tablet Commonly known as: EXCEDRIN TENSION HEADACHE Take 4 tablets by mouth as needed (migraines).   albuterol  108 (90 Base) MCG/ACT inhaler Commonly known as: VENTOLIN  HFA Inhale 2 puffs into the lungs every 6 (six) hours as needed for wheezing or shortness of breath.   ALPRAZolam  0.25 MG tablet Commonly known as: XANAX  Take 1 tablet (0.25 mg total) by mouth 2 (two) times daily as needed for anxiety.   butalbital -acetaminophen -caffeine  50-325-40 MG tablet Commonly known as: FIORICET  TAKE 1 TABLET BY MOUTH 3 (THREE) TIMES DAILY AS NEEDED FOR HEADACHE.   cyanocobalamin  1000 MCG/ML injection Commonly known as: VITAMIN B12 Inject 1 mL (1,000 mcg total) into the muscle every 14 (fourteen) days.   furosemide  20 MG  tablet Commonly known as: LASIX  Take 1 tablet (20 mg total) by mouth daily.   gabapentin  300 MG capsule Commonly known as: NEURONTIN  TAKE 2 CAPSULES BY MOUTH 3 TIMES A DAY AS NEEDED What changed:  how much to take how to take this when to take this additional instructions   HYDROcodone -acetaminophen  7.5-325 MG tablet Commonly known as: NORCO Take 1 tablet  by mouth 3 (three) times daily as needed for up to 3 days for moderate pain (pain score 4-6).   mirtazapine  30 MG tablet Commonly known as: REMERON  TAKE 0.5-1 TABLETS (15-30 MG TOTAL) BY MOUTH AT BEDTIME. TAKE 1 TABLET BY MOUTH EVERY DAY AT BEDTIME What changed: how much to take   ondansetron  4 MG tablet Commonly known as: Zofran  Take 1 tablet (4 mg total) by mouth every 8 (eight) hours as needed for nausea or vomiting.   pantoprazole  40 MG tablet Commonly known as: PROTONIX  Take 1 tablet (40 mg total) by mouth 2 (two) times daily.   Salonpas Pain Relief Patch 3-10 % Ptch Apply 1 patch topically as needed (Moderate pan).   venlafaxine  XR 150 MG 24 hr capsule Commonly known as: EFFEXOR -XR Take 1 capsule (150 mg total) by mouth daily with breakfast.   Vitamin D3 125 MCG (5000 UT) Caps Take 5,000 Units by mouth in the morning.   Zenpep  [id number] units Cpep Generic drug: Pancrelipase  (Lip-Prot-Amyl) Take 1 capsule by mouth 3 (three) times daily with meals.        Allergies  Allergen Reactions   Ketorolac Tromethamine Anaphylaxis    Tongue swells Pt can take other NSAIDs    Consultations: Gastroenterology   Procedures/Studies: CT ABDOMEN PELVIS W CONTRAST Result Date: 03/14/2024 EXAM: CT ABDOMEN AND PELVIS WITH CONTRAST 03/14/2024 09:15:31 PM TECHNIQUE: CT of the abdomen and pelvis was performed with the administration of intravenous contrast (75mL iohexol  (OMNIPAQUE ) 350 MG/ML injection). Multiplanar reformatted images are provided for review. Automated exposure control, iterative reconstruction, and/or weight based adjustment of the mA/kV was utilized to reduce the radiation dose to as low as reasonably achievable. COMPARISON: 11/01/2020 CLINICAL HISTORY: LLQ abdominal pain; Pancreatitis, acute, severe. Patient c/o left abdominal pain radiating into left shoulder x 6 weeks. Patient's provider wanted her to go for an MRI for concerns for pancreatitis on Thursday, but  patient states she can't wait that long. FINDINGS: LOWER CHEST: No acute abnormality. LIVER: The liver is unremarkable. GALLBLADDER AND BILE DUCTS: Gallbladder is unremarkable. No biliary ductal dilatation. SPLEEN: No acute abnormality. PANCREAS: No peripancreatic inflammatory changes to suggest acute pancreatitis. ADRENAL GLANDS: No acute abnormality. KIDNEYS, URETERS AND BLADDER: No stones in the kidneys or ureters. No hydronephrosis. No perinephric or periureteral stranding. Urinary bladder is unremarkable. GI AND BOWEL: Stomach demonstrates mild wall thickening/edema in the gastric antrum (image 27), nonspecific/equivocal. Correlate for gastritis or nonvisualized gastric ulcer. Moderate right colonic stool burden. There is no bowel obstruction. Status post appendectomy. PERITONEUM AND RETROPERITONEUM: No ascites. No free air. VASCULATURE: Aorta is normal in caliber. LYMPH NODES: No lymphadenopathy. REPRODUCTIVE ORGANS: No acute abnormality. BONES AND SOFT TISSUES: Status post PLIF at L5-S1. No acute osseous abnormality. No focal soft tissue abnormality. IMPRESSION: 1. No CT evidence of acute pancreatitis. 2. Mild wall thickening/edema in the gastric antrum, nonspecific/equivocal. Correlate for gastritis or nonvisualized gastric ulcer. Electronically signed by: Zadie Herter MD 03/14/2024 09:24 PM EDT RP Workstation: [id number]   MR Cervical Spine Wo Contrast Result Date: 02/16/2024 CLINICAL DATA:  Neck and left arm and hand pain with paresthesias. EXAM: MRI CERVICAL  SPINE WITHOUT CONTRAST TECHNIQUE: Multiplanar, multisequence MR imaging of the cervical spine was performed. No intravenous contrast was administered. COMPARISON:  CT cervical spine from 2022 FINDINGS: Alignment: Physiologic. Vertebrae: No fracture, evidence of discitis, or bone lesion. Cord: Normal signal and morphology. Posterior Fossa, vertebral arteries, paraspinal tissues: No significant findings. Disc levels: C2-3: No significant findings.  C3-4: Shallow right-sided disc osteophyte complex with mild impression on the right side of the thecal sac but no significant spinal or foraminal stenosis. C4-5 no significant findings. C5-6: Bulging annulus and mild osteophytic ridging with mild flattening of the ventral thecal sac and mild narrowing of the ventral CSF space. There is also mild right foraminal encroachment. No significant spinal or foraminal stenosis. C6-7: Moderate degenerative disc disease with disc space narrowing and osteophytic ridging. There is a bulging degenerated annulus and uncinate spurring changes. Flattening of the ventral thecal sac and narrowing the ventral CSF space but no significant spinal stenosis. There is mild foraminal stenosis bilaterally. C7-T1: No significant findings. IMPRESSION: 1. Shallow right-sided disc osteophyte complex at C3-4 with mild impression on the right side of the thecal sac but no significant spinal or foraminal stenosis. 2. Bulging annulus and mild osteophytic ridging at C5-6 with mild flattening of the ventral thecal sac and mild narrowing of the ventral CSF space. There is also mild right foraminal encroachment. 3. Moderate degenerative disc disease at C6-7 with disc space narrowing and osteophytic ridging. There is a bulging degenerated annulus and uncinate spurring changes. Flattening of the ventral thecal sac and narrowing the ventral CSF space but no significant spinal stenosis. There is mild foraminal stenosis bilaterally. Electronically Signed   By: Marrian Siva M.D.   On: 02/16/2024 19:24   (Echo, Carotid, EGD, Colonoscopy, ERCP)    Subjective: Relates her abdominal pain is improved.  Discharge Exam: Vitals:   03/17/24 0407 03/17/24 0745  BP: 137/85 98/75  Pulse: 95 86  Resp: 16 18  Temp: 98.4 F (36.9 C) 98.6 F (37 C)  SpO2: 93% 95%   Vitals:   03/16/24 1630 03/16/24 1921 03/17/24 0407 03/17/24 0745  BP: 99/75 115/65 137/85 98/75  Pulse: 92 94 95 86  Resp:  16 16 18    Temp: 98 F (36.7 C) 99.8 F (37.7 C) 98.4 F (36.9 C) 98.6 F (37 C)  TempSrc:  Oral Oral Oral  SpO2: (!) 89% 95% 93% 95%  Weight:      Height:        General: Pt is alert, awake, not in acute distress Cardiovascular: RRR, S1/S2 +, no rubs, no gallops Respiratory: CTA bilaterally, no wheezing, no rhonchi Abdominal: Soft, NT, ND, bowel sounds + Extremities: no edema, no cyanosis    The results of significant diagnostics from this hospitalization (including imaging, microbiology, ancillary and laboratory) are listed below for reference.     Microbiology: No results found for this or any previous visit (from the past 240 hours).   Labs: BNP (last 3 results) No results for input(s): "BNP" in the last 8760 hours. Basic Metabolic Panel: Recent Labs  Lab 03/14/24 1531 03/15/24 0512  NA 138 142  K 4.8 4.7  CL 103 107  CO2 23 29  GLUCOSE 106* 94  BUN 16 10  CREATININE 0.97 0.86  CALCIUM 9.8 9.4   Liver Function Tests: Recent Labs  Lab 03/14/24 1531 03/15/24 0512  AST 10* 13*  ALT 25 22  ALKPHOS 84 79  BILITOT 0.5 0.5  PROT 7.0 6.6  ALBUMIN 3.3* 3.0*   Recent  Labs  Lab 03/14/24 1531  LIPASE 62*   No results for input(s): "AMMONIA" in the last 168 hours. CBC: Recent Labs  Lab 03/14/24 1531 03/15/24 0512  WBC 16.2* 13.2*  HGB 13.1 12.9  HCT 41.5 41.2  MCV 99.3 100.0  PLT 489* 457*   Cardiac Enzymes: No results for input(s): "CKTOTAL", "CKMB", "CKMBINDEX", "TROPONINI" in the last 168 hours. BNP: Invalid input(s): "POCBNP" CBG: No results for input(s): "GLUCAP" in the last 168 hours. D-Dimer No results for input(s): "DDIMER" in the last 72 hours. Hgb A1c No results for input(s): "HGBA1C" in the last 72 hours. Lipid Profile No results for input(s): "CHOL", "HDL", "LDLCALC", "TRIG", "CHOLHDL", "LDLDIRECT" in the last 72 hours. Thyroid  function studies No results for input(s): "TSH", "T4TOTAL", "T3FREE", "THYROIDAB" in the last 72  hours.  Invalid input(s): "FREET3" Anemia work up No results for input(s): "VITAMINB12", "FOLATE", "FERRITIN", "TIBC", "IRON", "RETICCTPCT" in the last 72 hours. Urinalysis    Component Value Date/Time   COLORURINE YELLOW 03/14/2024 1531   APPEARANCEUR CLEAR 03/14/2024 1531   LABSPEC 1.021 03/14/2024 1531   PHURINE 5.0 03/14/2024 1531   GLUCOSEU NEGATIVE 03/14/2024 1531   GLUCOSEU 100 (A) 03/02/2024 1641   HGBUR NEGATIVE 03/14/2024 1531   BILIRUBINUR NEGATIVE 03/14/2024 1531   KETONESUR NEGATIVE 03/14/2024 1531   PROTEINUR NEGATIVE 03/14/2024 1531   UROBILINOGEN 0.2 03/02/2024 1641   NITRITE NEGATIVE 03/14/2024 1531   LEUKOCYTESUR NEGATIVE 03/14/2024 1531   Sepsis Labs Recent Labs  Lab 03/14/24 1531 03/15/24 0512  WBC 16.2* 13.2*   Microbiology No results found for this or any previous visit (from the past 240 hours).   Time coordinating discharge: Over 35 minutes  SIGNED:   Macdonald Savoy, MD  [facility] 03/17/2024, 8:45 AM Pager   If 7PM-7AM, please contact night-coverage [email or web address] Password [email or web address]

## 2024-03-17 NOTE — Plan of Care (Signed)

## 2024-03-17 NOTE — Progress Notes (Signed)
      Progress Note   Subjective  Patient doing well, tolerating PO, feeling better. Wants to go home   Objective   Vital signs in last 24 hours: Temp:  [97.9 F (36.6 C)-99.8 F (37.7 C)] 98.6 F (37 C) (06/06 0745) Pulse Rate:  [86-114] 86 (06/06 0745) Resp:  [16-19] 18 (06/06 0745) BP: (98-137)/(65-86) 98/75 (06/06 0745) SpO2:  [89 %-97 %] 95 % (06/06 0745) Last BM Date :  (Prior to hospital admission) General:    white female in NAD Neurologic:  Alert and oriented,  grossly normal neurologically. Psych:  Cooperative. Normal mood and affect.  Intake/Output from previous day: 06/05 0701 - 06/06 0700 In: 250 [P.O.:240; I.V.:10] Out: -  Intake/Output this shift: Total I/O In: 236 [P.O.:236] Out: -   Lab Results: Recent Labs    03/14/24 1531 03/15/24 0512  WBC 16.2* 13.2*  HGB 13.1 12.9  HCT 41.5 41.2  PLT 489* 457*   BMET Recent Labs    03/14/24 1531 03/15/24 0512  NA 138 142  K 4.8 4.7  CL 103 107  CO2 23 29  GLUCOSE 106* 94  BUN 16 10  CREATININE 0.97 0.86  CALCIUM 9.8 9.4   LFT Recent Labs    03/15/24 0512  PROT 6.6  ALBUMIN 3.0*  AST 13*  ALT 22  ALKPHOS 79  BILITOT 0.5   PT/INR No results for input(s): "LABPROT", "INR" in the last 72 hours.  Studies/Results: No results found.     Assessment / Plan:    53 y/o female here with the following:  Abdominal pain secondary to gastric ulcer NSAID use  EGD yesterday showed a clean based ulcer. Biopsies pending for H pylori testing, although significant NSAID use, which is the likely etiology. She is stable for discharge home.  Recommend complete avoidance of NSAIDs. Continue protonix  40mg  BID and carafate PRN as outpatient. Will need EGD in 2-3 months to assess for mucosal healing, with Dr. Yvone Herd. Await pathology results.   We will sign off at this time, call with questions.  Christi Coward, MD [facility]

## 2024-03-19 ENCOUNTER — Encounter (HOSPITAL_COMMUNITY): Payer: Self-pay | Admitting: Pediatrics

## 2024-03-21 NOTE — Anesthesia Postprocedure Evaluation (Signed)
 Anesthesia Post Note  Patient: Vanessa Payne  Procedure(s) Performed: EGD (ESOPHAGOGASTRODUODENOSCOPY)     Patient location during evaluation: Endoscopy Anesthesia Type: MAC Level of consciousness: awake and alert Pain management: pain level controlled Vital Signs Assessment: post-procedure vital signs reviewed and stable Respiratory status: spontaneous breathing, nonlabored ventilation and respiratory function stable Cardiovascular status: stable and blood pressure returned to baseline Postop Assessment: no apparent nausea or vomiting Anesthetic complications: no   No notable events documented.                  Gorden Stthomas

## 2024-03-23 ENCOUNTER — Ambulatory Visit: Payer: Self-pay | Admitting: *Deleted

## 2024-03-23 NOTE — Telephone Encounter (Signed)
  FYI Only or Action Required?: Action required by provider  Patient was last seen in primary care on 03/02/2024 by Plotnikov, Oakley Bellman, MD. Called Nurse Triage reporting Shoulder Pain. Symptoms began a week ago. Interventions attempted: Prescription medications: out of medication hydrocodone  from hospital visit and Rest, hydration, or home remedies. Symptoms are: unchanged.  Triage Disposition: See Physician Within 24 Hours  Patient/caregiver understands and will follow disposition?: No, wishes to speak with PCP                    Copied from CRM [id number]. Topic: Clinical - Red Word Triage >> Mar 23, 2024  1:27 PM [place] E wrote: [facility] that prompted transfer to Nurse Triage: Abdomen and shoulder pain. Patient is still experiencing some abdomen and shoulder pain, been going on for week and a half. Patient rated the pain a level 7 out of 10. Reason for Disposition  Numbness (i.e., loss of sensation) in hand or fingers  Answer Assessment - Initial Assessment Questions 1. ONSET: When did the pain start?     Greater than 1 week has been seen in ED for sx 2. LOCATION: Where is the pain located?     Left shoulder pain and abdominal pain 3. PAIN: How bad is the pain? (Scale 1-10; or mild, moderate, severe)   - MILD (1-3): doesn't interfere with normal activities   - MODERATE (4-7): interferes with normal activities (e.g., work or school) or awakens from sleep   - SEVERE (8-10): excruciating pain, unable to do any normal activities, unable to move arm at all due to pain     7/10 can sleep but not 8 hours  approx 4 hours  4. WORK OR EXERCISE: Has there been any recent work or exercise that involved this part of the body?     Yes lifting at work 5. CAUSE: What do you think is causing the shoulder pain?     Abdominal  ulcer 6. OTHER SYMPTOMS: Do you have any other symptoms? (e.g., neck pain, swelling, rash, fever, numbness, weakness)     Left shoulder numbness  persists abdominal pain from ulcer ,diarrhea  7. PREGNANCY: Is there any chance you are pregnant? When was your last menstrual period?     Na   Patient requesting pain medication to be prescribed hydrocodone . Please advise regarding refills on carafate . Patient is now back at work and no pain medication to help . No available appt with PCp until 03/29/24. Patient would like a call back. Recommended if pain worsens go to ED.  Protocols used: Shoulder Pain-A-AH

## 2024-03-23 NOTE — Telephone Encounter (Signed)
 EGD scheduled with patient for 05/31/24 at 7:00 am in the [facility] with Dr. Yvone Herd. Amb ref placed & instructions discussed and copy sent in mychart. No blood thinners/diabetic medications. Pt verbalized all understanding.

## 2024-03-24 ENCOUNTER — Other Ambulatory Visit: Payer: Self-pay | Admitting: Internal Medicine

## 2024-03-24 MED ORDER — HYDROCODONE-ACETAMINOPHEN 7.5-325 MG PO TABS: 1.0000 | ORAL_TABLET | Freq: Four times a day (QID) | ORAL | 0 refills | Status: DC | PRN

## 2024-03-24 NOTE — Telephone Encounter (Signed)
 Done.  Keep return office visit.  Thanks

## 2024-03-24 NOTE — Progress Notes (Signed)
 Done.  Keep return office visit

## 2024-03-28 ENCOUNTER — Other Ambulatory Visit: Payer: Self-pay | Admitting: Internal Medicine

## 2024-04-10 ENCOUNTER — Other Ambulatory Visit: Payer: Self-pay | Admitting: Pediatrics

## 2024-04-10 NOTE — Telephone Encounter (Signed)
 Is it ok to refill this medication for 90 days.  She is scheduled for another EGD in August.

## 2024-04-15 ENCOUNTER — Other Ambulatory Visit: Payer: Self-pay | Admitting: Family

## 2024-04-15 DIAGNOSIS — M5412 Radiculopathy, cervical region: Secondary | ICD-10-CM

## 2024-04-21 ENCOUNTER — Other Ambulatory Visit: Payer: Self-pay | Admitting: Internal Medicine

## 2024-05-19 ENCOUNTER — Other Ambulatory Visit: Payer: Self-pay | Admitting: Internal Medicine

## 2024-05-22 ENCOUNTER — Inpatient Hospital Stay: Attending: Internal Medicine | Admitting: General Practice

## 2024-05-22 NOTE — Progress Notes (Signed)
 [facility] Advance Directives Spiritual Care  Patient presented to Advance Directives Clinic  to review and complete healthcare advance directives.  Clinical Chaplain met with patient and her husband, Transport planner.  The patient designated Vanessa Payne of [place] [phone]) as their primary healthcare agent and Vanessa Payne of [place] [phone]) as their secondary agent.  Patient also completed healthcare living will.    Documents were notarized and copies made for patient/family. Chaplain will send documents to medical records to be scanned into patient's chart. Chaplain encouraged patient/family to contact with any additional questions or concerns.   [address] Vanessa Payne, [place], [facility] Pager [phone] Voicemail [phone]
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
## 2024-05-31 ENCOUNTER — Encounter: Payer: Self-pay | Admitting: Pediatrics

## 2024-05-31 ENCOUNTER — Ambulatory Visit (AMBULATORY_SURGERY_CENTER): Admitting: Pediatrics

## 2024-05-31 VITALS — BP 117/76 | HR 87 | Temp 97.9°F | Resp 20 | Ht 65.0 in | Wt 125.0 lb

## 2024-05-31 DIAGNOSIS — K319 Disease of stomach and duodenum, unspecified: Secondary | ICD-10-CM | POA: Diagnosis not present

## 2024-05-31 DIAGNOSIS — K259 Gastric ulcer, unspecified as acute or chronic, without hemorrhage or perforation: Secondary | ICD-10-CM

## 2024-05-31 DIAGNOSIS — K3189 Other diseases of stomach and duodenum: Secondary | ICD-10-CM | POA: Diagnosis not present

## 2024-05-31 DIAGNOSIS — K253 Acute gastric ulcer without hemorrhage or perforation: Secondary | ICD-10-CM

## 2024-05-31 MED ORDER — SODIUM CHLORIDE 0.9 % IV SOLN: 500.0000 mL | Freq: Once | INTRAVENOUS | Status: DC

## 2024-05-31 MED ORDER — PANTOPRAZOLE SODIUM 40 MG PO TBEC: 40.0000 mg | DELAYED_RELEASE_TABLET | Freq: Two times a day (BID) | ORAL | 3 refills | Status: DC

## 2024-05-31 MED ORDER — PANTOPRAZOLE SODIUM 40 MG PO TBEC: 40.0000 mg | DELAYED_RELEASE_TABLET | Freq: Every day | ORAL | 3 refills | Status: DC

## 2024-05-31 NOTE — Op Note (Signed)
 [facility] Patient Name: Vanessa Payne Procedure Date: 05/31/2024 7:14 AM MRN: [id number] Endoscopist: Inocente Hausen , MD, [id number] Age: 53 Referring MD:  Date of Birth: September 13, 1971 Gender: Female Account #: [id number] Procedure:                Upper GI endoscopy Indications:              Follow-up of gastric ulcer 03/2024 - likely NSAID                            induced Medicines:                Monitored Anesthesia Care Procedure:                Pre-Anesthesia Assessment:                           - Prior to the procedure, a History and Physical                            was performed, and patient medications and                            allergies were reviewed. The patient's tolerance of                            previous anesthesia was also reviewed. The risks                            and benefits of the procedure and the sedation                            options and risks were discussed with the patient.                            All questions were answered, and informed consent                            was obtained. Prior Anticoagulants: The patient has                            taken no anticoagulant or antiplatelet agents. ASA                            Grade Assessment: III - A patient with severe                            systemic disease. After reviewing the risks and                            benefits, the patient was deemed in satisfactory                            condition to undergo the procedure.  After obtaining informed consent, the endoscope was                            passed under direct vision. Throughout the                            procedure, the patient's blood pressure, pulse, and                            oxygen saturations were monitored continuously. The                            GIF [id number] #[id number] was introduced through the                            mouth, and advanced to the second part of  duodenum.                            The upper GI endoscopy was accomplished without                            difficulty. The patient tolerated the procedure                            well. Scope In: Scope Out: Findings:                 The examined esophagus was normal.                           One non-bleeding superficial gastric ulcer with no                            stigmata of bleeding was found in the gastric                            antrum. The lesion was 4 mm in largest dimension.                            Biopsies were taken with a cold forceps for                            Helicobacter pylori testing.                           The gastric body, cardia (on retroflexion) and                            gastric fundus (on retroflexion) were normal.                            Biopsies were taken with a cold forceps for                            Helicobacter pylori testing.  The duodenal bulb and second portion of the                            duodenum were normal. Complications:            No immediate complications. Estimated blood loss:                            Minimal. Estimated Blood Loss:     Estimated blood loss was minimal. Impression:               - Normal esophagus.                           - Non-bleeding gastric ulcer with no stigmata of                            bleeding. Biopsied.                           - Normal gastric body, cardia and gastric fundus.                            Biopsied.                           - Normal duodenal bulb and second portion of the                            duodenum. Recommendation:           - Discharge patient to home (ambulatory).                           - Await pathology results.                           - Continue pantoprazole  40 mg p.o. twice daily                           - Continue NSAID avoidance                           - Repeat EGD in 2 to 3 months for confirmation of                             complete gastric ulcer healing                           - The findings and recommendations were discussed                            with the patient's family.                           - Patient has a contact number available for  emergencies. The signs and symptoms of potential                            delayed complications were discussed with the                            patient. Return to normal activities tomorrow.                            Written discharge instructions were provided to the                            patient. Inocente Hausen, MD 05/31/2024 8:13:17 AM This report has been signed electronically.

## 2024-05-31 NOTE — Patient Instructions (Signed)
 YOU HAD AN ENDOSCOPIC PROCEDURE TODAY AT THE [facility]:   Refer to the procedure report that was given to you for any specific questions about what was found during the examination.  If the procedure report does not answer your questions, please call your gastroenterologist to clarify.  If you requested that your care partner not be given the details of your procedure findings, then the procedure report has been included in a sealed envelope for you to review at your convenience later.  YOU SHOULD EXPECT: Some feelings of bloating in the abdomen. Passage of more gas than usual.  Walking can help get rid of the air that was put into your GI tract during the procedure and reduce the bloating. If you had a lower endoscopy (such as a colonoscopy or flexible sigmoidoscopy) you may notice spotting of blood in your stool or on the toilet paper. If you underwent a bowel prep for your procedure, you may not have a normal bowel movement for a few days.  Please Note:  You might notice some irritation and congestion in your nose or some drainage.  This is from the oxygen used during your procedure.  There is no need for concern and it should clear up in a day or so.  SYMPTOMS TO REPORT IMMEDIATELY:  Following upper endoscopy (EGD)  Vomiting of blood or coffee ground material  New chest pain or pain under the shoulder blades  Painful or persistently difficult swallowing  New shortness of breath  Fever of 100F or higher  Black, tarry-looking stools  Resume previous diet Await pathology results Continue pantoprazole   40 mg by mouth twice daily Avoid NSAIDS   For urgent or emergent issues, a gastroenterologist can be reached at any hour by calling [phone]. Do not use MyChart messaging for urgent concerns.    DIET:  We do recommend a small meal at first, but then you may proceed to your regular diet.  Drink plenty of fluids but you should avoid alcoholic beverages for 24  hours.  ACTIVITY:  You should plan to take it easy for the rest of today and you should NOT DRIVE or use heavy machinery until tomorrow (because of the sedation medicines used during the test).    FOLLOW UP: Our staff will call the number listed on your records the next business day following your procedure.  We will call around 7:15- 8:00 am to check on you and address any questions or concerns that you may have regarding the information given to you following your procedure. If we do not reach you, we will leave a message.     If any biopsies were taken you will be contacted by phone or by letter within the next 1-3 weeks.  Please call us  at [phone] if you have not heard about the biopsies in 3 weeks.    SIGNATURES/CONFIDENTIALITY: You and/or your care partner have signed paperwork which will be entered into your electronic medical record.  These signatures attest to the fact that that the information above on your After Visit Summary has been reviewed and is understood.  Full responsibility of the confidentiality of this discharge information lies with you and/or your care-partner.

## 2024-05-31 NOTE — Progress Notes (Signed)
 Called to room to assist during endoscopic procedure.  Patient ID and intended procedure confirmed with present staff. Received instructions for my participation in the procedure from the performing physician.

## 2024-05-31 NOTE — Progress Notes (Signed)
 Pt sedate, gd SR's, VSS, report to RN

## 2024-06-01 ENCOUNTER — Telehealth: Payer: Self-pay | Admitting: *Deleted

## 2024-06-01 NOTE — Telephone Encounter (Signed)
  Follow up Call-     05/31/2024    7:19 AM  Call back number  Post procedure Call Back phone  # [phone]  Permission to leave phone message Yes     Patient questions:  Do you have a fever, pain , or abdominal swelling? No. Pain Score  0 *  Have you tolerated food without any problems? Yes.    Have you been able to return to your normal activities? Yes.    Do you have any questions about your discharge instructions: Diet   No. Medications  No. Follow up visit  No.  Do you have questions or concerns about your Care? No.  Actions: * If pain score is 4 or above: No action needed, pain <4.

## 2024-06-05 LAB — SURGICAL PATHOLOGY

## 2024-06-12 ENCOUNTER — Ambulatory Visit: Payer: Self-pay | Admitting: Pediatrics

## 2024-06-26 ENCOUNTER — Other Ambulatory Visit: Payer: Self-pay | Admitting: Internal Medicine

## 2024-07-13 ENCOUNTER — Other Ambulatory Visit: Payer: Self-pay | Admitting: Internal Medicine

## 2024-07-17 ENCOUNTER — Other Ambulatory Visit: Payer: Self-pay | Admitting: Internal Medicine

## 2024-07-28 ENCOUNTER — Other Ambulatory Visit: Payer: Self-pay | Admitting: Internal Medicine

## 2024-07-31 ENCOUNTER — Encounter: Admitting: Pediatrics
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
## 2024-08-09 ENCOUNTER — Encounter: Payer: Self-pay | Admitting: Pediatrics

## 2024-08-09 ENCOUNTER — Ambulatory Visit (AMBULATORY_SURGERY_CENTER): Admitting: Pediatrics

## 2024-08-09 VITALS — BP 103/74 | HR 74 | Temp 98.8°F | Resp 19 | Ht 65.0 in | Wt 125.0 lb

## 2024-08-09 DIAGNOSIS — K3189 Other diseases of stomach and duodenum: Secondary | ICD-10-CM | POA: Diagnosis not present

## 2024-08-09 DIAGNOSIS — K253 Acute gastric ulcer without hemorrhage or perforation: Secondary | ICD-10-CM

## 2024-08-09 DIAGNOSIS — K2970 Gastritis, unspecified, without bleeding: Secondary | ICD-10-CM

## 2024-08-09 MED ORDER — SODIUM CHLORIDE 0.9 % IV SOLN: 500.0000 mL | INTRAVENOUS | Status: DC

## 2024-08-09 NOTE — Patient Instructions (Signed)

## 2024-08-09 NOTE — Progress Notes (Signed)
 Sedate, gd SR, tolerated procedure well, VSS, report to RN

## 2024-08-09 NOTE — Op Note (Signed)
 [facility] Patient Name: Vanessa Payne Procedure Date: 08/09/2024 11:45 AM MRN: [id number] Endoscopist: Inocente Hausen , MD, [id number] Age: 53 Referring MD:  Date of Birth: 10-22-1970 Gender: Female Account #: [id number] Procedure:                Upper GI endoscopy Indications:              Follow-up of chronic gastric ulcer, Follow-up of                            gastritis; history of 10 mm gastric antral ulcer on                            EGD 03/2024; Repeat EGD 05/19/2024 showed improvement                            in ulcer size but incomplete resolution. Patient                            has continued on twice daily PPI for an additional                            2 months and EGD is performed today for                            confirmation of ulcer healing. Gastric biopsies                            have been negative for H. pylori and malignancy. Medicines:                Monitored Anesthesia Care Procedure:                Pre-Anesthesia Assessment:                           - Prior to the procedure, a History and Physical                            was performed, and patient medications and                            allergies were reviewed. The patient's tolerance of                            previous anesthesia was also reviewed. The risks                            and benefits of the procedure and the sedation                            options and risks were discussed with the patient.                            All questions were answered, and informed consent  was obtained. Prior Anticoagulants: The patient has                            taken no anticoagulant or antiplatelet agents. ASA                            Grade Assessment: II - A patient with mild systemic                            disease. After reviewing the risks and benefits,                            the patient was deemed in satisfactory condition to                             undergo the procedure.                           After obtaining informed consent, the endoscope was                            passed under direct vision. Throughout the                            procedure, the patient's blood pressure, pulse, and                            oxygen saturations were monitored continuously. The                            GIF [id number] #[id number] was introduced through the                            mouth, and advanced to the second part of duodenum.                            The upper GI endoscopy was accomplished without                            difficulty. The patient tolerated the procedure                            well. Scope In: Scope Out: Findings:                 The examined esophagus was normal.                           Patchy mildly erythematous mucosa was found in the                            gastric antrum and in the prepyloric region of the                            stomach.  The gastric body, cardia (on retroflexion) and                            gastric fundus (on retroflexion) were normal.                           The duodenal bulb and second portion of the                            duodenum were normal. Complications:            No immediate complications. Estimated blood loss:                            None. Estimated Blood Loss:     Estimated blood loss: none. Impression:               - Normal esophagus.                           - Erythematous mucosa in the antrum and prepyloric                            region of the stomach.                           - Normal gastric body, cardia and gastric fundus.                           - Normal duodenal bulb and second portion of the                            duodenum.                           - No specimens collected. Recommendation:           - Discharge patient to home (ambulatory).                           - Decrease pantoprazole  to 40  mg orally daily from                            twice daily dosing. Continue NSAID avoidance.                           - The findings and recommendations were discussed                            with the designated responsible adult.                           - Patient has a contact number available for                            emergencies. The signs and symptoms of potential  delayed complications were discussed with the                            patient. Return to normal activities tomorrow.                            Written discharge instructions were provided to the                            patient. Inocente Hausen, MD 08/09/2024 11:59:52 AM This report has been signed electronically.

## 2024-08-10 ENCOUNTER — Telehealth: Payer: Self-pay | Admitting: *Deleted

## 2024-08-10 NOTE — Telephone Encounter (Signed)
  Follow up Call-     08/09/2024   11:19 AM 05/31/2024    7:19 AM  Call back number  Post procedure Call Back phone  # [phone]  Permission to leave phone message Yes Yes     Patient questions:  Do you have a fever, pain , or abdominal swelling? No. Pain Score  0 *  Have you tolerated food without any problems? Yes.    Have you been able to return to your normal activities? Yes.    Do you have any questions about your discharge instructions: Diet   No. Medications  No. Follow up visit  No.  Do you have questions or concerns about your Care? No.  Actions: * If pain score is 4 or above: No action needed, pain <4.

## 2024-08-23 ENCOUNTER — Other Ambulatory Visit: Payer: Self-pay | Admitting: Internal Medicine

## 2024-08-25 ENCOUNTER — Telehealth: Payer: Self-pay

## 2024-08-25 NOTE — Telephone Encounter (Signed)
 Copied from CRM #[id number]. Topic: Clinical - Medication Question >> Aug 25, 2024 10:32 AM Alfonso HERO wrote: Reason for CRM: patient calling for status of refill for butalbital -acetaminophen -caffeine  (FIORICET ) 50-325-40 MG tablet

## 2024-08-28 NOTE — Telephone Encounter (Signed)
Patient scheduled for follow up 11/24.

## 2024-08-28 NOTE — Telephone Encounter (Signed)
 It was refilled.  Schedule office visit.  Thank you

## 2024-09-04 ENCOUNTER — Encounter: Payer: Self-pay | Admitting: Internal Medicine

## 2024-09-04 ENCOUNTER — Ambulatory Visit: Admitting: Internal Medicine

## 2024-09-04 VITALS — BP 102/68 | HR 94 | Temp 97.6°F | Ht 65.0 in | Wt 104.6 lb

## 2024-09-04 DIAGNOSIS — Z780 Asymptomatic menopausal state: Secondary | ICD-10-CM | POA: Diagnosis not present

## 2024-09-04 DIAGNOSIS — E538 Deficiency of other specified B group vitamins: Secondary | ICD-10-CM

## 2024-09-04 DIAGNOSIS — S62102S Fracture of unspecified carpal bone, left wrist, sequela: Secondary | ICD-10-CM

## 2024-09-04 DIAGNOSIS — F332 Major depressive disorder, recurrent severe without psychotic features: Secondary | ICD-10-CM

## 2024-09-04 DIAGNOSIS — S62109A Fracture of unspecified carpal bone, unspecified wrist, initial encounter for closed fracture: Secondary | ICD-10-CM | POA: Insufficient documentation

## 2024-09-04 DIAGNOSIS — Z23 Encounter for immunization: Secondary | ICD-10-CM | POA: Diagnosis not present

## 2024-09-04 DIAGNOSIS — E2839 Other primary ovarian failure: Secondary | ICD-10-CM

## 2024-09-04 DIAGNOSIS — S62102D Fracture of unspecified carpal bone, left wrist, subsequent encounter for fracture with routine healing: Secondary | ICD-10-CM

## 2024-09-04 DIAGNOSIS — G4710 Hypersomnia, unspecified: Secondary | ICD-10-CM

## 2024-09-04 DIAGNOSIS — K257 Chronic gastric ulcer without hemorrhage or perforation: Secondary | ICD-10-CM

## 2024-09-04 DIAGNOSIS — G43109 Migraine with aura, not intractable, without status migrainosus: Secondary | ICD-10-CM

## 2024-09-04 LAB — CBC WITH DIFFERENTIAL/PLATELET
Basophils Absolute: 0 K/uL (ref 0.0–0.1)
Basophils Relative: 0.5 % (ref 0.0–3.0)
Eosinophils Absolute: 0.1 K/uL (ref 0.0–0.7)
Eosinophils Relative: 1.6 % (ref 0.0–5.0)
HCT: 39.2 % (ref 36.0–46.0)
Hemoglobin: 12.9 g/dL (ref 12.0–15.0)
Lymphocytes Relative: 35.5 % (ref 12.0–46.0)
Lymphs Abs: 2.6 K/uL (ref 0.7–4.0)
MCHC: 32.9 g/dL (ref 30.0–36.0)
MCV: 96.2 fl (ref 78.0–100.0)
Monocytes Absolute: 0.5 K/uL (ref 0.1–1.0)
Monocytes Relative: 6.8 % (ref 3.0–12.0)
Neutro Abs: 4.1 K/uL (ref 1.4–7.7)
Neutrophils Relative %: 55.6 % (ref 43.0–77.0)
Platelets: 378 K/uL (ref 150.0–400.0)
RBC: 4.08 Mil/uL (ref 3.87–5.11)
RDW: 16.1 % — ABNORMAL HIGH (ref 11.5–15.5)
WBC: 7.3 K/uL (ref 4.0–10.5)

## 2024-09-04 LAB — COMPREHENSIVE METABOLIC PANEL WITH GFR
ALT: 10 U/L (ref 0–35)
AST: 15 U/L (ref 0–37)
Albumin: 4 g/dL (ref 3.5–5.2)
Alkaline Phosphatase: 99 U/L (ref 39–117)
BUN: 15 mg/dL (ref 6–23)
CO2: 22 meq/L (ref 19–32)
Calcium: 9.5 mg/dL (ref 8.4–10.5)
Chloride: 113 meq/L — ABNORMAL HIGH (ref 96–112)
Creatinine, Ser: 0.74 mg/dL (ref 0.40–1.20)
GFR: 92.2 mL/min (ref >60.00)
Glucose, Bld: 71 mg/dL (ref 70–99)
Potassium: 3.7 meq/L (ref 3.5–5.1)
Sodium: 141 meq/L (ref 135–145)
Total Bilirubin: 0.2 mg/dL (ref 0.2–1.2)
Total Protein: 7.2 g/dL (ref 6.0–8.3)

## 2024-09-04 LAB — URINALYSIS
Bilirubin Urine: NEGATIVE
Hgb urine dipstick: NEGATIVE
Ketones, ur: NEGATIVE
Leukocytes,Ua: NEGATIVE
Nitrite: NEGATIVE
Specific Gravity, Urine: 1.025 (ref 1.000–1.030)
Urine Glucose: NEGATIVE
Urobilinogen, UA: 0.2 (ref 0.0–1.0)
pH: 6 (ref 5.0–8.0)

## 2024-09-04 LAB — TSH: TSH: 1.23 u[IU]/mL (ref 0.35–5.50)

## 2024-09-04 LAB — VITAMIN B12: Vitamin B-12: 683 pg/mL (ref 211–911)

## 2024-09-04 LAB — VITAMIN D 25 HYDROXY (VIT D DEFICIENCY, FRACTURES): VITD: 17.64 ng/mL — ABNORMAL LOW (ref 30.00–100.00)

## 2024-09-04 MED ORDER — BUTALBITAL-APAP-CAFFEINE 50-325-40 MG PO TABS: 1.0000 | ORAL_TABLET | Freq: Three times a day (TID) | ORAL | 1 refills | Status: DC | PRN

## 2024-09-04 MED ORDER — PANTOPRAZOLE SODIUM 40 MG PO TBEC: 40.0000 mg | DELAYED_RELEASE_TABLET | Freq: Every day | ORAL | 3 refills | Status: AC

## 2024-09-04 MED ORDER — GABAPENTIN 300 MG PO CAPS: ORAL_CAPSULE | ORAL | 1 refills | Status: AC

## 2024-09-04 MED ORDER — MIRTAZAPINE 30 MG PO TABS
15.0000 mg | ORAL_TABLET | Freq: Every day | ORAL | 2 refills | Status: AC
Start: 2024-09-04 — End: ?

## 2024-09-04 MED ORDER — VENLAFAXINE HCL ER 150 MG PO CP24: 150.0000 mg | ORAL_CAPSULE | Freq: Every day | ORAL | 1 refills | Status: AC

## 2024-09-04 MED ORDER — ALPRAZOLAM 0.25 MG PO TABS: 0.2500 mg | ORAL_TABLET | Freq: Two times a day (BID) | ORAL | 3 refills | Status: AC | PRN

## 2024-09-04 NOTE — Assessment & Plan Note (Signed)
Cont w/Fioricet - the only thing that helps w/HA. Not to be used at work, when driving etc  Potential benefits of a long term Fioricet use as well as potential risks  and complications were explained to the patient and were aknowledged.

## 2024-09-04 NOTE — Assessment & Plan Note (Signed)
Continue with B12 

## 2024-09-04 NOTE — Progress Notes (Signed)
 Subjective:  Patient ID: Vanessa Payne, female    DOB: 09/29/1971  Age: 53 y.o. MRN: [id number]  CC: Medical Management of Chronic Issues (6 Month follow up)   HPI Vanessa Payne presents for a gastric ulcer - healing (2nd EGD 08/09/24 - Dr Suzann). On PPI. Off Carafate . On Celebrex (Dr Johnita Overcast is aware) - L wrist fx - dist radius ORIF - Dr Shari (workman's comp) Lost 30 lbs  Outpatient Medications Prior to Visit  Medication Sig Dispense Refill   celecoxib (CELEBREX) 200 MG capsule TAKE 1 CAPSULE EVERY DAY BY ORAL ROUTE WITH MEAL(S).     Cholecalciferol  (VITAMIN D3) 5000 UNITS CAPS Take 5,000 Units by mouth in the morning.     cyanocobalamin  (VITAMIN B12) 1000 MCG/ML injection Inject 1 mL (1,000 mcg total) into the muscle every 14 (fourteen) days. 6 mL 5   ondansetron  (ZOFRAN ) 4 MG tablet Take 1 tablet (4 mg total) by mouth every 8 (eight) hours as needed for nausea or vomiting. 20 tablet 0   sucralfate  (CARAFATE ) 1 g tablet TAKE 1 TABLET BY MOUTH 3 TIMES DAILY. 270 tablet 1   VENTOLIN  HFA 108 (90 Base) MCG/ACT inhaler TAKE 2 PUFFS BY MOUTH EVERY 6 HOURS AS NEEDED FOR WHEEZE OR SHORTNESS OF BREATH 18 each 11   ALPRAZolam  (XANAX ) 0.25 MG tablet TAKE 1 TABLET BY MOUTH TWICE A DAY AS NEEDED FOR ANXIETY 60 tablet 1   butalbital -acetaminophen -caffeine  (FIORICET ) 50-325-40 MG tablet TAKE 1 TABLET BY MOUTH 3 (THREE) TIMES DAILY AS NEEDED FOR HEADACHE. 30 tablet 0   gabapentin  (NEURONTIN ) 300 MG capsule TAKE 2 CAPSULES BY MOUTH 3 TIMES A DAY AS NEEDED 180 capsule 1   mirtazapine  (REMERON ) 30 MG tablet TAKE 0.5-1 TABLETS (15-30 MG TOTAL) BY MOUTH AT BEDTIME. TAKE 1 TABLET BY MOUTH EVERY DAY AT BEDTIME (Patient taking differently: Take 30 mg by mouth at bedtime. TAKE 1 TABLET BY MOUTH EVERY DAY AT BEDTIME) 90 tablet 2   oxyCODONE -acetaminophen  (PERCOCET/ROXICET) 5-325 MG tablet Take 1 tablet by mouth every 4 (four) hours as needed.     pantoprazole  (PROTONIX ) 40 MG tablet Take 1 tablet (40 mg  total) by mouth daily. 90 tablet 3   venlafaxine  XR (EFFEXOR -XR) 150 MG 24 hr capsule TAKE 1 CAPSULE BY MOUTH DAILY WITH BREAKFAST. 90 capsule 1   acetaminophen -caffeine  (EXCEDRIN TENSION HEADACHE) 500-65 MG TABS per tablet Take 4 tablets by mouth as needed (migraines). (Patient not taking: Reported on 09/04/2024)     furosemide  (LASIX ) 20 MG tablet Take 1 tablet (20 mg total) by mouth daily. (Patient not taking: Reported on 09/04/2024) 30 tablet 5   HYDROcodone -acetaminophen  (NORCO) 7.5-325 MG tablet Take 1 tablet by mouth every 6 (six) hours as needed for moderate pain (pain score 4-6). (Patient not taking: Reported on 09/04/2024) 20 tablet 0   Pancrelipase , Lip-Prot-Amyl, (ZENPEP ) 60000-189600 units CPEP Take 1 capsule by mouth 3 (three) times daily with meals. (Patient not taking: Reported on 09/04/2024) 90 capsule 3   No facility-administered medications prior to visit.    ROS: Review of Systems  Constitutional:  Positive for fatigue and unexpected weight change. Negative for activity change, appetite change and chills.  HENT:  Negative for congestion, mouth sores and sinus pressure.   Eyes:  Negative for visual disturbance.  Respiratory:  Negative for cough and chest tightness.   Gastrointestinal:  Negative for abdominal pain, nausea and vomiting.  Genitourinary:  Negative for difficulty urinating, frequency and vaginal pain.  Musculoskeletal:  Positive for arthralgias,  back pain and gait problem.  Skin:  Negative for pallor and rash.  Neurological:  Positive for headaches. Negative for dizziness, tremors, weakness and numbness.  Psychiatric/Behavioral:  Negative for confusion and sleep disturbance.     Objective:  BP 102/68   Pulse 94   Temp 97.6 F (36.4 C)   Ht 5' 5 (1.651 m)   Wt 104 lb 9.6 oz (47.4 kg)   BMI 17.41 kg/m   BP Readings from Last 3 Encounters:  09/04/24 102/68  08/09/24 103/74  05/31/24 117/76    Wt Readings from Last 3 Encounters:  09/04/24 104 lb  9.6 oz (47.4 kg)  08/09/24 125 lb (56.7 kg)  05/31/24 125 lb (56.7 kg)    Physical Exam Constitutional:      General: She is not in acute distress.    Appearance: She is well-developed.  HENT:     Head: Normocephalic.     Right Ear: External ear normal.     Left Ear: External ear normal.     Nose: Nose normal.  Eyes:     General:        Right eye: No discharge.        Left eye: No discharge.     Conjunctiva/sclera: Conjunctivae normal.     Pupils: Pupils are equal, round, and reactive to light.  Neck:     Thyroid : No thyromegaly.     Vascular: No JVD.     Trachea: No tracheal deviation.  Cardiovascular:     Rate and Rhythm: Normal rate and regular rhythm.     Heart sounds: Normal heart sounds.  Pulmonary:     Effort: No respiratory distress.     Breath sounds: No stridor. No wheezing.  Abdominal:     General: Bowel sounds are normal. There is no distension.     Palpations: Abdomen is soft. There is no mass.     Tenderness: There is no abdominal tenderness. There is no guarding or rebound.  Musculoskeletal:        General: Tenderness present.     Cervical back: Normal range of motion and neck supple. No rigidity.     Right lower leg: No edema.     Left lower leg: No edema.  Lymphadenopathy:     Cervical: No cervical adenopathy.  Skin:    Findings: No bruising, erythema or rash.  Neurological:     Mental Status: She is oriented to person, place, and time. Mental status is at baseline.     Cranial Nerves: No cranial nerve deficit.     Motor: No abnormal muscle tone.     Coordination: Coordination normal.     Gait: Gait abnormal.     Deep Tendon Reflexes: Reflexes normal.  Psychiatric:        Behavior: Behavior normal.        Thought Content: Thought content normal.        Judgment: Judgment normal.    Thin L wrist w/a scar     A total time of 45 minutes was spent preparing to see the patient, reviewing tests, x-rays, operative reports and other medical  records.  Also, obtaining history and performing comprehensive physical exam.  Additionally, counseling the patient regarding the above listed issues - ulcers, HAs, bone fx.   Finally, documenting clinical information in the health records, coordination of care, educating the patient. It is a complex case.  Lab Results  Component Value Date   WBC 13.2 (H) 03/15/2024   HGB 12.9 03/15/2024  HCT 41.2 03/15/2024   PLT 457 (H) 03/15/2024   GLUCOSE 94 03/15/2024   CHOL 299 (H) 03/24/2023   TRIG 245.0 (H) 03/24/2023   HDL 55.50 03/24/2023   LDLDIRECT 205.0 03/24/2023   LDLCALC 126 (H) 03/07/2019   ALT 22 03/15/2024   AST 13 (L) 03/15/2024   NA 142 03/15/2024   K 4.7 03/15/2024   CL 107 03/15/2024   CREATININE 0.86 03/15/2024   BUN 10 03/15/2024   CO2 29 03/15/2024   TSH 2.98 03/24/2023    CT ABDOMEN PELVIS W CONTRAST Result Date: 03/14/2024 EXAM: CT ABDOMEN AND PELVIS WITH CONTRAST 03/14/2024 09:15:31 PM TECHNIQUE: CT of the abdomen and pelvis was performed with the administration of intravenous contrast (75mL iohexol  (OMNIPAQUE ) 350 MG/ML injection). Multiplanar reformatted images are provided for review. Automated exposure control, iterative reconstruction, and/or weight based adjustment of the mA/kV was utilized to reduce the radiation dose to as low as reasonably achievable. COMPARISON: 11/01/2020 CLINICAL HISTORY: LLQ abdominal pain; Pancreatitis, acute, severe. Patient c/o left abdominal pain radiating into left shoulder x 6 weeks. Patient's provider wanted her to go for an MRI for concerns for pancreatitis on Thursday, but patient states she can't wait that long. FINDINGS: LOWER CHEST: No acute abnormality. LIVER: The liver is unremarkable. GALLBLADDER AND BILE DUCTS: Gallbladder is unremarkable. No biliary ductal dilatation. SPLEEN: No acute abnormality. PANCREAS: No peripancreatic inflammatory changes to suggest acute pancreatitis. ADRENAL GLANDS: No acute abnormality. KIDNEYS, URETERS  AND BLADDER: No stones in the kidneys or ureters. No hydronephrosis. No perinephric or periureteral stranding. Urinary bladder is unremarkable. GI AND BOWEL: Stomach demonstrates mild wall thickening/edema in the gastric antrum (image 27), nonspecific/equivocal. Correlate for gastritis or nonvisualized gastric ulcer. Moderate right colonic stool burden. There is no bowel obstruction. Status post appendectomy. PERITONEUM AND RETROPERITONEUM: No ascites. No free air. VASCULATURE: Aorta is normal in caliber. LYMPH NODES: No lymphadenopathy. REPRODUCTIVE ORGANS: No acute abnormality. BONES AND SOFT TISSUES: Status post PLIF at L5-S1. No acute osseous abnormality. No focal soft tissue abnormality. IMPRESSION: 1. No CT evidence of acute pancreatitis. 2. Mild wall thickening/edema in the gastric antrum, nonspecific/equivocal. Correlate for gastritis or nonvisualized gastric ulcer. Electronically signed by: Pinkie Pebbles MD 03/14/2024 09:24 PM EDT RP Workstation: [id number]    Assessment & Plan:   Problem List Items Addressed This Visit     B12 deficiency   Continue with B12      Relevant Orders   Vitamin B12   Daytime hypersomnia   No relapse No somnolence      Depression   F/u w/your psychologist On Effexor        Relevant Medications   mirtazapine  (REMERON ) 30 MG tablet   venlafaxine  XR (EFFEXOR -XR) 150 MG 24 hr capsule   ALPRAZolam  (XANAX ) 0.25 MG tablet   Gastric ulcer without hemorrhage or perforation   Gastric ulcer - healing (2nd EGD 08/09/24 - Dr Suzann). On PPI. Off Carafate . On Celebrex (Dr Johnita Overcast is aware) - L wrist fx - dist radius ORIF - Dr Shari      Relevant Orders   CBC with Differential/Platelet   Comprehensive metabolic panel with GFR   TSH   Urinalysis   Migraines   Cont w/Fioricet  - the only thing that helps w/HA. Not to be used at work, when driving etc  Potential benefits of a long term Fioricet  use as well as potential risks  and complications were  explained to the patient and were aknowledged.      Relevant Medications   mirtazapine  (  REMERON ) 30 MG tablet   venlafaxine  XR (EFFEXOR -XR) 150 MG 24 hr capsule   butalbital -acetaminophen -caffeine  (FIORICET ) 50-325-40 MG tablet   gabapentin  (NEURONTIN ) 300 MG capsule   Wrist fracture   On Celebrex (Dr Johnita Overcast is aware) - L wrist fx - dist radius ORIF - Dr Shari (workman's comp) DEXA ordered      Relevant Orders   DG Bone Density Peripheral Skeleton   VITAMIN D  25 Hydroxy (Vit-D Deficiency, Fractures)   Other Visit Diagnoses       Immunization due    -  Primary   Relevant Orders   Flu vaccine trivalent PF, 6mos and older(Flulaval,Afluria,Fluarix,Fluzone) (Completed)     Postmenopausal estrogen deficiency       Relevant Orders   DG Bone Density Peripheral Skeleton   VITAMIN D  25 Hydroxy (Vit-D Deficiency, Fractures)         Meds ordered this encounter  Medications   pantoprazole  (PROTONIX ) 40 MG tablet    Sig: Take 1 tablet (40 mg total) by mouth daily.    Dispense:  90 tablet    Refill:  3   mirtazapine  (REMERON ) 30 MG tablet    Sig: Take 0.5-1 tablets (15-30 mg total) by mouth at bedtime. TAKE 1 TABLET BY MOUTH EVERY DAY AT BEDTIME    Dispense:  90 tablet    Refill:  2   venlafaxine  XR (EFFEXOR -XR) 150 MG 24 hr capsule    Sig: Take 1 capsule (150 mg total) by mouth daily with breakfast.    Dispense:  90 capsule    Refill:  1   ALPRAZolam  (XANAX ) 0.25 MG tablet    Sig: Take 1 tablet (0.25 mg total) by mouth 2 (two) times daily as needed for anxiety. TAKE 1 TABLET BY MOUTH TWICE A DAY AS NEEDED FOR ANXIETY    Dispense:  60 tablet    Refill:  3    This request is for a new prescription for a controlled substance as required by Federal/State law.PT NEEDS REFILLS.   butalbital -acetaminophen -caffeine  (FIORICET ) 50-325-40 MG tablet    Sig: Take 1 tablet by mouth 3 (three) times daily as needed for headache.    Dispense:  90 tablet    Refill:  1    Schedule office  visit   gabapentin  (NEURONTIN ) 300 MG capsule    Sig: TAKE 2 CAPSULES BY MOUTH 3 TIMES A DAY AS NEEDED    Dispense:  180 capsule    Refill:  1      Follow-up: Return in about 3 months (around 12/05/2024) for a follow-up visit.  Marolyn Noel, MD

## 2024-09-04 NOTE — Assessment & Plan Note (Signed)
 F/u w/your psychologist On Effexor

## 2024-09-04 NOTE — Assessment & Plan Note (Signed)
 No relapse No somnolence

## 2024-09-04 NOTE — Assessment & Plan Note (Signed)
 Gastric ulcer - healing (2nd EGD 08/09/24 - Dr Suzann). On PPI. Off Carafate . On Celebrex (Dr Johnita Overcast is aware) - L wrist fx - dist radius ORIF - Dr Shari

## 2024-09-04 NOTE — Assessment & Plan Note (Addendum)
 On Celebrex (Dr Johnita Overcast is aware) - L wrist fx - dist radius ORIF - Dr Shari (workman's comp) DEXA ordered

## 2024-09-05 ENCOUNTER — Ambulatory Visit: Payer: Self-pay | Admitting: Internal Medicine

## 2024-10-10 ENCOUNTER — Other Ambulatory Visit: Payer: Self-pay | Admitting: Internal Medicine

## 2024-10-17 ENCOUNTER — Observation Stay (HOSPITAL_COMMUNITY)
Admission: EM | Admit: 2024-10-17 | Discharge: 2024-10-19 | Disposition: A | Discharge status: Home / Self Care | Attending: Emergency Medicine | Admitting: Emergency Medicine

## 2024-10-17 ENCOUNTER — Encounter (HOSPITAL_COMMUNITY): Payer: Self-pay

## 2024-10-17 ENCOUNTER — Emergency Department (HOSPITAL_COMMUNITY)

## 2024-10-17 ENCOUNTER — Ambulatory Visit: Payer: Self-pay

## 2024-10-17 ENCOUNTER — Other Ambulatory Visit: Payer: Self-pay

## 2024-10-17 DIAGNOSIS — I63542 Cerebral infarction due to unspecified occlusion or stenosis of left cerebellar artery: Principal | ICD-10-CM | POA: Insufficient documentation

## 2024-10-17 DIAGNOSIS — F418 Other specified anxiety disorders: Secondary | ICD-10-CM | POA: Insufficient documentation

## 2024-10-17 DIAGNOSIS — Z79899 Other long term (current) drug therapy: Secondary | ICD-10-CM | POA: Diagnosis not present

## 2024-10-17 DIAGNOSIS — J45909 Unspecified asthma, uncomplicated: Secondary | ICD-10-CM | POA: Diagnosis not present

## 2024-10-17 DIAGNOSIS — I639 Cerebral infarction, unspecified: Principal | ICD-10-CM | POA: Diagnosis present

## 2024-10-17 DIAGNOSIS — K219 Gastro-esophageal reflux disease without esophagitis: Secondary | ICD-10-CM | POA: Diagnosis not present

## 2024-10-17 DIAGNOSIS — R29700 NIHSS score 0: Secondary | ICD-10-CM | POA: Diagnosis not present

## 2024-10-17 DIAGNOSIS — F17210 Nicotine dependence, cigarettes, uncomplicated: Secondary | ICD-10-CM | POA: Diagnosis not present

## 2024-10-17 DIAGNOSIS — K279 Peptic ulcer, site unspecified, unspecified as acute or chronic, without hemorrhage or perforation: Secondary | ICD-10-CM

## 2024-10-17 DIAGNOSIS — G8929 Other chronic pain: Secondary | ICD-10-CM | POA: Insufficient documentation

## 2024-10-17 DIAGNOSIS — E872 Acidosis, unspecified: Secondary | ICD-10-CM | POA: Diagnosis not present

## 2024-10-17 DIAGNOSIS — F419 Anxiety disorder, unspecified: Secondary | ICD-10-CM | POA: Diagnosis present

## 2024-10-17 DIAGNOSIS — F32A Depression, unspecified: Secondary | ICD-10-CM | POA: Diagnosis present

## 2024-10-17 DIAGNOSIS — Z720 Tobacco use: Secondary | ICD-10-CM | POA: Insufficient documentation

## 2024-10-17 DIAGNOSIS — R42 Dizziness and giddiness: Secondary | ICD-10-CM | POA: Diagnosis present

## 2024-10-17 LAB — URINALYSIS, ROUTINE W REFLEX MICROSCOPIC
Bilirubin Urine: NEGATIVE
Glucose, UA: NEGATIVE mg/dL
Hgb urine dipstick: NEGATIVE
Ketones, ur: NEGATIVE mg/dL
Leukocytes,Ua: NEGATIVE
Nitrite: NEGATIVE
Protein, ur: NEGATIVE mg/dL
Specific Gravity, Urine: 1.033 — ABNORMAL HIGH (ref 1.005–1.030)
pH: 5 (ref 5.0–8.0)

## 2024-10-17 LAB — COMPREHENSIVE METABOLIC PANEL WITH GFR
ALT: 22 U/L (ref 0–44)
AST: 23 U/L (ref 15–41)
Albumin: 3.9 g/dL (ref 3.5–5.0)
Alkaline Phosphatase: 110 U/L (ref 38–126)
Anion gap: 13 (ref 5–15)
BUN: 9 mg/dL (ref 6–20)
CO2: 20 mmol/L — ABNORMAL LOW (ref 22–32)
Calcium: 9.4 mg/dL (ref 8.9–10.3)
Chloride: 109 mmol/L (ref 98–111)
Creatinine, Ser: 0.84 mg/dL (ref 0.44–1.00)
GFR, Estimated: >60 mL/min
Glucose, Bld: 94 mg/dL (ref 70–99)
Potassium: 3.5 mmol/L (ref 3.5–5.1)
Sodium: 142 mmol/L (ref 135–145)
Total Bilirubin: <0.2 mg/dL (ref 0.0–1.2)
Total Protein: 7.3 g/dL (ref 6.5–8.1)

## 2024-10-17 LAB — CBC
HCT: 41.3 % (ref 36.0–46.0)
Hemoglobin: 13.3 g/dL (ref 12.0–15.0)
MCH: 32.8 pg (ref 26.0–34.0)
MCHC: 32.2 g/dL (ref 30.0–36.0)
MCV: 102 fL — ABNORMAL HIGH (ref 80.0–100.0)
Platelets: 449 K/uL — ABNORMAL HIGH (ref 150–400)
RBC: 4.05 MIL/uL (ref 3.87–5.11)
RDW: 16.7 % — ABNORMAL HIGH (ref 11.5–15.5)
WBC: 7.2 K/uL (ref 4.0–10.5)
nRBC: 0 % (ref 0.0–0.2)

## 2024-10-17 LAB — CBG MONITORING, ED: Glucose-Capillary: 99 mg/dL (ref 70–99)

## 2024-10-17 MED ORDER — SODIUM CHLORIDE 0.9 % IV SOLN: INTRAVENOUS | Status: AC

## 2024-10-17 MED ORDER — MIRTAZAPINE 15 MG PO TABS
15.0000 mg | ORAL_TABLET | Freq: Every day | ORAL | Status: DC
  Administered 2024-10-17 – 2024-10-18 (×2): 15 mg via ORAL
  Filled 2024-10-17 (×2): qty 1

## 2024-10-17 MED ORDER — PANTOPRAZOLE SODIUM 40 MG PO TBEC
40.0000 mg | DELAYED_RELEASE_TABLET | Freq: Two times a day (BID) | ORAL | Status: DC
  Administered 2024-10-17 – 2024-10-19 (×4): 40 mg via ORAL
  Filled 2024-10-17 (×4): qty 1

## 2024-10-17 MED ORDER — VENLAFAXINE HCL ER 75 MG PO CP24
150.0000 mg | ORAL_CAPSULE | Freq: Every day | ORAL | Status: DC
  Administered 2024-10-18 – 2024-10-19 (×2): 150 mg via ORAL
  Filled 2024-10-17 (×2): qty 2

## 2024-10-17 MED ORDER — ACETAMINOPHEN 500 MG PO TABS: 1000.0000 mg | ORAL_TABLET | Freq: Three times a day (TID) | ORAL | Status: DC | PRN

## 2024-10-17 MED ORDER — LORAZEPAM 2 MG/ML IJ SOLN
1.0000 mg | Freq: Once | INTRAMUSCULAR | Status: AC
  Administered 2024-10-17: 1 mg via INTRAVENOUS
  Filled 2024-10-17: qty 1

## 2024-10-17 MED ORDER — ONDANSETRON HCL 4 MG/2ML IJ SOLN: 4.0000 mg | Freq: Four times a day (QID) | INTRAMUSCULAR | Status: DC | PRN

## 2024-10-17 MED ORDER — ALPRAZOLAM 0.25 MG PO TABS
0.2500 mg | ORAL_TABLET | Freq: Two times a day (BID) | ORAL | Status: DC | PRN
  Administered 2024-10-17 – 2024-10-19 (×3): 0.25 mg via ORAL
  Filled 2024-10-17 (×3): qty 1

## 2024-10-17 MED ORDER — IOHEXOL 350 MG/ML SOLN
75.0000 mL | Freq: Once | INTRAVENOUS | Status: AC | PRN
  Administered 2024-10-17: 75 mL via INTRAVENOUS

## 2024-10-17 MED ORDER — DIPHENHYDRAMINE HCL 50 MG/ML IJ SOLN
50.0000 mg | Freq: Once | INTRAMUSCULAR | Status: AC
  Administered 2024-10-17: 50 mg via INTRAVENOUS
  Filled 2024-10-17: qty 1

## 2024-10-17 MED ORDER — ENOXAPARIN SODIUM 30 MG/0.3ML IJ SOSY
30.0000 mg | PREFILLED_SYRINGE | INTRAMUSCULAR | Status: DC
  Administered 2024-10-18 – 2024-10-19 (×2): 30 mg via SUBCUTANEOUS
  Filled 2024-10-17 (×2): qty 0.3

## 2024-10-17 MED ORDER — PROCHLORPERAZINE EDISYLATE 10 MG/2ML IJ SOLN
10.0000 mg | Freq: Once | INTRAMUSCULAR | Status: AC
  Administered 2024-10-17: 10 mg via INTRAVENOUS
  Filled 2024-10-17: qty 2

## 2024-10-17 MED ORDER — SODIUM CHLORIDE 0.9 % IV BOLUS
1000.0000 mL | Freq: Once | INTRAVENOUS | Status: AC
  Administered 2024-10-17: 1000 mL via INTRAVENOUS

## 2024-10-17 MED ORDER — GABAPENTIN 300 MG PO CAPS
600.0000 mg | ORAL_CAPSULE | Freq: Three times a day (TID) | ORAL | Status: DC | PRN
  Administered 2024-10-17 – 2024-10-18 (×2): 600 mg via ORAL
  Filled 2024-10-17 (×2): qty 2

## 2024-10-17 MED ORDER — SUCRALFATE 1 G PO TABS
1.0000 g | ORAL_TABLET | Freq: Three times a day (TID) | ORAL | Status: DC
  Administered 2024-10-17 – 2024-10-19 (×5): 1 g via ORAL
  Filled 2024-10-17 (×5): qty 1

## 2024-10-17 MED ORDER — MECLIZINE HCL 25 MG PO TABS
50.0000 mg | ORAL_TABLET | Freq: Once | ORAL | Status: AC
  Administered 2024-10-17: 50 mg via ORAL
  Filled 2024-10-17: qty 2

## 2024-10-17 MED ORDER — DEXAMETHASONE SOD PHOSPHATE PF 10 MG/ML IJ SOLN
10.0000 mg | Freq: Once | INTRAMUSCULAR | Status: AC
  Administered 2024-10-17: 10 mg via INTRAVENOUS
  Filled 2024-10-17: qty 1

## 2024-10-17 MED ORDER — NICOTINE 14 MG/24HR TD PT24
14.0000 mg | MEDICATED_PATCH | Freq: Every day | TRANSDERMAL | Status: DC
  Administered 2024-10-18 – 2024-10-19 (×2): 14 mg via TRANSDERMAL
  Filled 2024-10-17 (×2): qty 1

## 2024-10-17 MED ORDER — STROKE: EARLY STAGES OF RECOVERY BOOK
Freq: Once | Status: AC
  Filled 2024-10-17: qty 1

## 2024-10-17 MED ORDER — ALBUTEROL SULFATE (2.5 MG/3ML) 0.083% IN NEBU: 2.5000 mg | INHALATION_SOLUTION | Freq: Four times a day (QID) | RESPIRATORY_TRACT | Status: DC | PRN

## 2024-10-17 MED ORDER — CLOPIDOGREL BISULFATE 75 MG PO TABS
75.0000 mg | ORAL_TABLET | Freq: Every day | ORAL | Status: DC
  Administered 2024-10-18 – 2024-10-19 (×2): 75 mg via ORAL
  Filled 2024-10-17 (×2): qty 1

## 2024-10-17 NOTE — ED Provider Notes (Signed)
 " [place] EMERGENCY DEPARTMENT AT [facility] Provider Note   CSN: [id number] Arrival date & time: 10/17/24  1141     Patient presents with: Dizziness   Vanessa Payne is a 54 y.o. female.   HPI Presents with headache, nausea, dizziness. Patient seen and evaluated by physician colleague prior to my evaluation, his note included below. In essence patient notes a history of headaches, denies history of strokes, currently has no weakness anywhere, but over the past days has been uncomfortable.  Vanessa Payne is a 54 y.o. female with a past medical history significant for migraines, asthma, depression, GERD, anxiety, previous appendectomy, and previous tubal ligation who presents with new dizziness with headache, nausea, vomiting.  According to patient, she woke up yesterday and about 50 minutes after waking up had sudden onset of profound dizziness.  She reports everything is spinning around her and this is new.  No history of vertigo.  She reports this was causing her to have some headache that is moderate to severe and feels similar to previous bad migraines.  She does have sound and light sensitivity has had some nausea and vomiting with it.  She reports difficulty ambulating with how dizzy she is.   He denies any head trauma.  Denies any fevers, chills, congestion, cough, constipation, diarrhea, or urinary changes.  She denies any speech difficulties or vision changes.  Denies any arm or leg symptoms.  She is never had this before.    Prior to Admission medications  Medication Sig Start Date End Date Taking? Authorizing Provider  acetaminophen -caffeine  (EXCEDRIN TENSION HEADACHE) 500-65 MG TABS per tablet Take 4 tablets by mouth as needed (migraines). Patient not taking: Reported on 09/04/2024    [provider]  ALPRAZolam  (XANAX ) 0.25 MG tablet Take 1 tablet (0.25 mg total) by mouth 2 (two) times daily as needed for anxiety. TAKE 1 TABLET BY MOUTH TWICE A DAY  AS NEEDED FOR ANXIETY 09/04/24   Plotnikov, Karlynn GAILS, MD  butalbital -acetaminophen -caffeine  (FIORICET ) 50-325-40 MG tablet Take 1 tablet by mouth 3 (three) times daily as needed for headache. 09/04/24   Plotnikov, Aleksei V, MD  celecoxib (CELEBREX) 200 MG capsule TAKE 1 CAPSULE EVERY DAY BY ORAL ROUTE WITH MEAL(S). 05/03/24   [provider]  Cholecalciferol  (VITAMIN D3) 5000 UNITS CAPS Take 5,000 Units by mouth in the morning.    [provider]  cyanocobalamin  (VITAMIN B12) 1000 MCG/ML injection INJECT 1 ML (1,000 MCG TOTAL) INTO THE MUSCLE EVERY 14 (FOURTEEN) DAYS. 10/10/24   Plotnikov, Aleksei V, MD  furosemide  (LASIX ) 20 MG tablet Take 1 tablet (20 mg total) by mouth daily. Patient not taking: Reported on 09/04/2024 03/24/23   Plotnikov, Karlynn GAILS, MD  gabapentin  (NEURONTIN ) 300 MG capsule TAKE 2 CAPSULES BY MOUTH 3 TIMES A DAY AS NEEDED 09/04/24   Plotnikov, Aleksei V, MD  mirtazapine  (REMERON ) 30 MG tablet Take 0.5-1 tablets (15-30 mg total) by mouth at bedtime. TAKE 1 TABLET BY MOUTH EVERY DAY AT BEDTIME 09/04/24   Plotnikov, Karlynn GAILS, MD  ondansetron  (ZOFRAN ) 4 MG tablet Take 1 tablet (4 mg total) by mouth every 8 (eight) hours as needed for nausea or vomiting. 03/02/24   Plotnikov, Karlynn GAILS, MD  pantoprazole  (PROTONIX ) 40 MG tablet Take 1 tablet (40 mg total) by mouth daily. 09/04/24   Plotnikov, Aleksei V, MD  sucralfate  (CARAFATE ) 1 g tablet TAKE 1 TABLET BY MOUTH 3 TIMES DAILY. 04/10/24   Suzann Inocente HERO, MD  venlafaxine  XR (EFFEXOR -XR)  150 MG 24 hr capsule Take 1 capsule (150 mg total) by mouth daily with breakfast. 09/04/24   Plotnikov, Karlynn GAILS, MD  VENTOLIN  HFA 108 (90 Base) MCG/ACT inhaler TAKE 2 PUFFS BY MOUTH EVERY 6 HOURS AS NEEDED FOR WHEEZE OR SHORTNESS OF BREATH 04/21/24   Plotnikov, Aleksei V, MD    Allergies: Ketorolac tromethamine    Review of Systems  Updated Vital Signs BP 117/78   Pulse 79   Temp 98.4 F (36.9 C) (Oral)   Resp 18   Ht 1.651 m  (5' 5)   Wt 45.4 kg   SpO2 96%   BMI 16.64 kg/m   Physical Exam Vitals and nursing note reviewed.  Constitutional:      General: She is not in acute distress.    Appearance: She is well-developed.  HENT:     Head: Normocephalic and atraumatic.  Eyes:     Conjunctiva/sclera: Conjunctivae normal.  Cardiovascular:     Rate and Rhythm: Normal rate and regular rhythm.  Pulmonary:     Effort: Pulmonary effort is normal. No respiratory distress.     Breath sounds: Normal breath sounds. No stridor.  Abdominal:     General: There is no distension.  Skin:    General: Skin is warm and dry.  Neurological:     General: No focal deficit present.     Mental Status: She is alert and oriented to person, place, and time.     Cranial Nerves: No cranial nerve deficit.  Psychiatric:        Mood and Affect: Mood normal.     (all labs ordered are listed, but only abnormal results are displayed) Labs Reviewed  COMPREHENSIVE METABOLIC PANEL WITH GFR - Abnormal; Notable for the following components:      Result Value   CO2 20 (*)    All other components within normal limits  CBC - Abnormal; Notable for the following components:   MCV 102.0 (*)    RDW 16.7 (*)    Platelets 449 (*)    All other components within normal limits  URINALYSIS, ROUTINE W REFLEX MICROSCOPIC - Abnormal; Notable for the following components:   Specific Gravity, Urine 1.033 (*)    All other components within normal limits  CBG MONITORING, ED    EKG: EKG Interpretation Date/Time:  Tuesday October 17 2024 11:52:04 EST Ventricular Rate:  99 PR Interval:  126 QRS Duration:  80 QT Interval:  326 QTC Calculation: 418 R Axis:   87  Text Interpretation: Normal sinus rhythm Right atrial enlargement Nonspecific ST abnormality Abnormal ECG When compared with ECG of 02-Feb-2024 03:16, PREVIOUS ECG IS PRESENT when compared to prior, overall similar appearance NO sTEMI Confirmed by Ginger Barefoot ([id number]) on 10/17/2024 2:12:33  PM  Radiology: MR BRAIN WO CONTRAST Result Date: 10/17/2024 EXAM: MRI Brain Without Contrast 10/17/2024 06:58:07 PM TECHNIQUE: Multiplanar multisequence MRI of the head/brain was performed without the administration of intravenous contrast. COMPARISON: CTA head/neck earlier today CLINICAL HISTORY: Headache, neuro deficit; dizziness discerning for a neurologic etiology. Impressive carotid bruit on left side but not on the right. FINDINGS: BRAIN AND VENTRICLES: Small acute inferior left cerebellar infarct. No intracranial hemorrhage. No mass. No midline shift. No hydrocephalus. The sella is unremarkable. Normal flow voids. ORBITS: No acute abnormality. SINUSES AND MASTOIDS: No acute abnormality. BONES AND SOFT TISSUES: Normal marrow signal. No acute soft tissue abnormality. IMPRESSION: 1. Small acute inferior left cerebellar infarct. Electronically signed by: Gilmore Molt 10/17/2024 07:23 PM EST RP Workstation: [id number]  CT ANGIO HEAD NECK W WO CM Result Date: 10/17/2024 EXAM: CT HEAD WITHOUT CTA HEAD AND NECK WITH AND WITHOUT 10/17/2024 02:30:28 PM TECHNIQUE: CTA of the head and neck was performed with and without the administration of intravenous contrast. Noncontrast CT of the head with reconstructed 2-D images are also provided for review. Multiplanar 2D and/or 3D reformatted images are provided for review. Automated exposure control, iterative reconstruction, and/or weight based adjustment of the mA/kV was utilized to reduce the radiation dose to as low as reasonably achievable. 75 mL of iohexol  (OMNIPAQUE ) 350 MG/ML injection was administered. COMPARISON: Head CT 11/01/2020 and MRI 06/06/2014 CLINICAL HISTORY: Very loud left carotid bruit, room spinning dizziness since yesterday unchanged. No history of vertigo. Headache like her migraines. Rule out stroke or neurovascular disease. FINDINGS: CT HEAD: BRAIN AND VENTRICLES: There is no evidence of an acute infarct, intracranial hemorrhage, mass, midline  shift, hydrocephalus, or extra-axial fluid collection. Cerebral volume is normal. ORBITS: No acute abnormality. SINUSES AND MASTOIDS: No acute abnormality. CTA NECK: AORTIC ARCH AND ARCH VESSELS: No dissection or arterial injury. Prominent soft plaque throughout the left subclavian artery with multifocal plaque ulceration resulting in moderate to severe stenoses both proximal and distal to the left vertebral artery origin. Only at most mild atherosclerosis in the right subclavian artery without a significant stenosis. CERVICAL CAROTID ARTERIES: Scattered soft plaque in the common carotid and proximal internal carotid arteries with mild plaque ulceration in both carotid bulbs. 65% stenosis of the proximal right ICA. Moderate narrowing of both proximal external carotid arteries. No significant left sided cervical ICA stenosis. Motion artifact limits assessment of the proximal to mid cervical ICAs, particularly on the right. CERVICAL VERTEBRAL ARTERIES: The vertebral arteries are patent and codominant with severe origin stenoses bilaterally. No dissection or arterial injury. LUNGS AND MEDIASTINUM: Unremarkable. SOFT TISSUES: No acute abnormality. BONES: Mild cervical spondylosis. CTA HEAD: ANTERIOR CIRCULATION: The intracranial internal carotid arteries are widely patent. An infundibulum is noted at the origin of the right ophthalmic artery. There is a 1.5 mm protrusion from the right supraclinoid ICA, without a visible associated vessel and compatible with an aneurysm. ACAs and MCAs are patent without evidence of a proximal branch occlusion or significant proximal stenosis. The right A1 segment is hypoplastic. POSTERIOR CIRCULATION: The intracranial vertebral arteries are patent to the basilar with a mild to moderate stenosis of the mid to distal left V4 segment. The basilar artery is widely patent. Posterior communicating arteries are diminutive or absent. Both PCAs are patent proximally without evidence of a  significant proximal stenosis. There is occlusion of a mid to distal left P2 branch vessel. OTHER: No dural venous sinus thrombosis on this non-dedicated study. IMPRESSION: 1. No evidence of an acute infarct or intracranial hemorrhage. 2. Occlusion of a mid to distal left P2 branch vessel, of indeterminate acuity. 3. Prominent soft plaque throughout the left subclavian artery with multifocal plaque ulceration and moderate to severe stenoses. 4. 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 Mild to moderate left V4 stenosis. 5. 65% stenosis of the proximal right ICA. 6. 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 Electronically signed by: Dasie Hamburg MD 10/17/2024 03:10 PM EST RP Workstation: [id number]     Procedures   Medications Ordered in the ED  prochlorperazine  (COMPAZINE ) injection 10 mg (10 mg Intravenous Given 10/17/24 1357)  diphenhydrAMINE  (BENADRYL ) injection 50 mg (50 mg Intravenous Given 10/17/24 1354)  sodium chloride  0.9 % bolus 1,000 mL (0 mLs Intravenous Stopped 10/17/24 1711)  meclizine  (ANTIVERT ) tablet 50  mg (50 mg Oral Given 10/17/24 1352)  iohexol  (OMNIPAQUE ) 350 MG/ML injection 75 mL (75 mLs Intravenous Contrast Given 10/17/24 1431)  LORazepam  (ATIVAN ) injection 1 mg (1 mg Intravenous Given 10/17/24 1812)  dexamethasone  (DECADRON ) injection 10 mg (10 mg Intravenous Given 10/17/24 1804)  sodium chloride  0.9 % bolus 1,000 mL (0 mLs Intravenous Paused 10/17/24 1840)                                    Medical Decision Making Differential for headache, dizziness, in a generally well-appearing female with unremarkable neuroexam, vital signs. Concerns include atypical migraine, stroke, mass, infection. Patient received analgesics, CT from triage interpreted, MRI from triage pending.  Amount and/or Complexity of Data Reviewed Labs: ordered. Decision-making details documented in ED Course. Radiology: independent interpretation performed. Decision-making details documented in ED  Course. ECG/medicine tests: independent interpretation performed. Decision-making details documented in ED Course.  Risk Prescription drug management. Decision regarding hospitalization. Diagnosis or treatment significantly limited by social determinants of health.   7:26 PM Patient required additional analgesics, remained hemodynamically unremarkable, no, MRI results are available, findings concerning for acute cerebellar infarct.  Last seen normal was yesterday, possibly the day prior before going to bed, even the patient woke yesterday with her dizziness, headache. With concern for acute stroke patient will be admitted for further monitoring, management.    Final diagnoses:  Acute CVA (cerebrovascular accident) [facility])    ED Discharge Orders     None          Garrick Charleston, MD 10/17/24 1927  "

## 2024-10-17 NOTE — ED Notes (Signed)
 Pt ambulated from wheelchair to stretcher.

## 2024-10-17 NOTE — ED Provider Triage Note (Signed)
 Emergency Medicine Provider Triage Evaluation Note  Berwyn LITTIE Nigh , a 54 y.o. female  was evaluated in triage.  Pt complains of profound dizziness with nausea and vomiting and headache since yesterday morning.  Review of Systems  Positive: World and room spinning dizziness new from prior.  Headache similar to previous migraine with some light and sound sensitivity Negative: No head trauma.  No fevers, chills, congestion, cough, constipation, diarrhea, or urinary changes.  No numbness or weakness in extremities.  Physical Exam  BP 104/71 (BP Location: Left Arm)   Pulse 96   Temp 97.6 F (36.4 C) (Oral)   Resp 20   Ht 5' 5 (1.651 m)   Wt 45.4 kg   SpO2 99%   BMI 16.64 kg/m  Gen:   Awake, no distress   Resp:  Normal effort  MSK:   Moves extremities without difficulty  Other:  Extremely impressive carotid bruit on the left compared to the right.  Otherwise no focal neurologic deficits initially.  Symmetric smile.  Clear speech.  Pupils symmetric and reactive with normal extraocular movements.  Lungs clear chest nontender.  Medical Decision Making  Medically screening exam initiated at 1:00 PM.  Appropriate orders placed.  Berwyn LITTIE Nigh was informed that the remainder of the evaluation will be completed by another provider, this initial triage assessment does not replace that evaluation, and the importance of remaining in the ED until their evaluation is complete.  LAURETTA SALLAS is a 54 y.o. female with a past medical history significant for migraines, asthma, depression, GERD, anxiety, previous appendectomy, and previous tubal ligation who presents with new dizziness with headache, nausea, vomiting.  According to patient, she woke up yesterday and about 50 minutes after waking up had sudden onset of profound dizziness.  She reports everything is spinning around her and this is new.  No history of vertigo.  She reports this was causing her to have some headache that is moderate to  severe and feels similar to previous bad migraines.  She does have sound and light sensitivity has had some nausea and vomiting with it.  She reports difficulty ambulating with how dizzy she is.  He denies any head trauma.  Denies any fevers, chills, congestion, cough, constipation, diarrhea, or urinary changes.  She denies any speech difficulties or vision changes.  Denies any arm or leg symptoms.  She is never had this before.  On my exam, lungs clear and chest nontender.  Abdomen nontender.  Intact sensation and strength in extremities.  Symmetric smile.  Clear speech.  Pupil symmetric and reactive.  Patient however does have an extremely loud left carotid bruit compared to the right.  With this asymmetry on carotid auscultation I am somewhat concerned about a neurologic etiology.  We discussed that could just be vertigo or even a GI bug causing nausea and vomiting and triggering headache however with the on her neck sounds, we will get CTA of the head and neck and then get MRI of the brain as well.  Will give her a headache cocktail with meclizine  as well.  Anticipate reassessment after workup is completed and she is seen by provider in her room.               Madilyn Cephas, Lonni PARAS, MD 10/17/24 [phone]
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
## 2024-10-17 NOTE — ED Notes (Signed)
 Patient transported to MRI

## 2024-10-17 NOTE — ED Notes (Signed)
 Pt to ct

## 2024-10-17 NOTE — Telephone Encounter (Signed)
 FYI Only or Action Required?: FYI only for provider: ED advised.  Patient was last seen in primary care on 09/04/2024 by Plotnikov, Karlynn GAILS, MD.  Called Nurse Triage reporting Dizziness and Nausea.  Symptoms began yesterday.  Interventions attempted: Nothing.  Symptoms are: gradually worsening.  Triage Disposition: Go to ED Now (or PCP Triage)  Patient/caregiver understands and will follow disposition?: Yes   Copied from CRM [id number]. Topic: Clinical - Red Word Triage >> Oct 17, 2024 10:27 AM Harlene ORN wrote: Red Word that prompted transfer to Nurse Triage: since yesterday morning. severe dizziness and nausea  Room tilted  Severe HA, nausea Reason for Disposition  SEVERE dizziness (e.g., unable to stand, requires support to walk, feels like passing out now)  Answer Assessment - Initial Assessment Questions Advised ED now, patient reports I don't have anyone that could drive me. Nurse offered to call 911, patient okay with calling 911. Nurse called EMS, EMS in route.  1. DESCRIPTION: Describe your dizziness.     Room tilted, fine until standing up/sitting up, room tilts, have to hold on to something, severe HA, nausea, vomiting; pt laying flat in bed. 2. LIGHTHEADED: Do you feel lightheaded? (e.g., somewhat faint, woozy, weak upon standing)     denies 3. VERTIGO: Do you feel like either you or the room is spinning or tilting? (i.e., vertigo)     Room spinning and tilting 4. SEVERITY: How bad is it?  Do you feel like you are going to faint? Can you stand and walk?     Can walk but room tilted 5. ONSET:  When did the dizziness begin?     yesterday 6. AGGRAVATING FACTORS: Does anything make it worse? (e.g., standing, change in head position)     Standing makes worse, laying down makes better 8. CAUSE: What do you think is causing the dizziness? (e.g., decreased fluids or food, diarrhea, emotional distress, heat exposure, new medicine, sudden standing,  vomiting; unknown)     no 9. RECURRENT SYMPTOM: Have you had dizziness before? If Yes, ask: When was the last time? What happened that time?     no 10. OTHER SYMPTOMS: Do you have any other symptoms? (e.g., fever, chest pain, vomiting, diarrhea, bleeding)   HA,  Vomiting, nausea 24 hours: 15 times, whatever drinks  Not able to keep fluids, reports urinating as normal Denies fever, chills, abd pain, chest pain, diff breathing  Protocols used: Dizziness - Lightheadedness-A-AH

## 2024-10-17 NOTE — ED Notes (Signed)
 Pt back from ct. Warm blanket given. Fluid restarted.

## 2024-10-17 NOTE — ED Triage Notes (Signed)
 Pt bib [facility] for dizziness, nausea, and vomiting for 2 days. Hx of migraines but states this doesn't feel like one. She says that closing eyes help.

## 2024-10-18 ENCOUNTER — Observation Stay (HOSPITAL_COMMUNITY)

## 2024-10-18 ENCOUNTER — Encounter: Payer: Self-pay | Admitting: Internal Medicine

## 2024-10-18 DIAGNOSIS — I639 Cerebral infarction, unspecified: Secondary | ICD-10-CM

## 2024-10-18 DIAGNOSIS — E785 Hyperlipidemia, unspecified: Secondary | ICD-10-CM

## 2024-10-18 DIAGNOSIS — I6389 Other cerebral infarction: Secondary | ICD-10-CM | POA: Diagnosis not present

## 2024-10-18 DIAGNOSIS — F411 Generalized anxiety disorder: Secondary | ICD-10-CM

## 2024-10-18 DIAGNOSIS — I7090 Unspecified atherosclerosis: Secondary | ICD-10-CM

## 2024-10-18 DIAGNOSIS — I739 Peripheral vascular disease, unspecified: Secondary | ICD-10-CM | POA: Diagnosis not present

## 2024-10-18 DIAGNOSIS — I63542 Cerebral infarction due to unspecified occlusion or stenosis of left cerebellar artery: Secondary | ICD-10-CM | POA: Diagnosis not present

## 2024-10-18 DIAGNOSIS — R29701 NIHSS score 1: Secondary | ICD-10-CM

## 2024-10-18 DIAGNOSIS — I10 Essential (primary) hypertension: Secondary | ICD-10-CM | POA: Diagnosis not present

## 2024-10-18 DIAGNOSIS — F17210 Nicotine dependence, cigarettes, uncomplicated: Secondary | ICD-10-CM

## 2024-10-18 DIAGNOSIS — K279 Peptic ulcer, site unspecified, unspecified as acute or chronic, without hemorrhage or perforation: Secondary | ICD-10-CM

## 2024-10-18 DIAGNOSIS — Z720 Tobacco use: Secondary | ICD-10-CM | POA: Diagnosis not present

## 2024-10-18 LAB — LIPID PANEL
Cholesterol: 279 mg/dL — ABNORMAL HIGH (ref 0–200)
HDL: 38 mg/dL — ABNORMAL LOW
LDL Cholesterol: 192 mg/dL — ABNORMAL HIGH (ref 0–99)
Total CHOL/HDL Ratio: 7.4 ratio
Triglycerides: 247 mg/dL — ABNORMAL HIGH
VLDL: 49 mg/dL — ABNORMAL HIGH (ref 0–40)

## 2024-10-18 LAB — BASIC METABOLIC PANEL WITH GFR
Anion gap: 9 (ref 5–15)
BUN: 12 mg/dL (ref 6–20)
CO2: 21 mmol/L — ABNORMAL LOW (ref 22–32)
Calcium: 8.8 mg/dL — ABNORMAL LOW (ref 8.9–10.3)
Chloride: 113 mmol/L — ABNORMAL HIGH (ref 98–111)
Creatinine, Ser: 0.67 mg/dL (ref 0.44–1.00)
GFR, Estimated: >60 mL/min
Glucose, Bld: 102 mg/dL — ABNORMAL HIGH (ref 70–99)
Potassium: 3.7 mmol/L (ref 3.5–5.1)
Sodium: 142 mmol/L (ref 135–145)

## 2024-10-18 LAB — ECHOCARDIOGRAM COMPLETE
Area-P 1/2: 3.12 cm2
Height: 65 in
S' Lateral: 2.9 cm
Weight: 1600 [oz_av]

## 2024-10-18 LAB — HEMOGLOBIN A1C
Hgb A1c MFr Bld: 5.4 % (ref 4.8–5.6)
Mean Plasma Glucose: 108.28 mg/dL

## 2024-10-18 MED ORDER — PERFLUTREN LIPID MICROSPHERE
1.0000 mL | INTRAVENOUS | Status: AC | PRN
  Administered 2024-10-18: 2 mL via INTRAVENOUS

## 2024-10-18 MED ORDER — OXYCODONE HCL 5 MG PO TABS
5.0000 mg | ORAL_TABLET | Freq: Four times a day (QID) | ORAL | Status: DC | PRN
  Administered 2024-10-18 – 2024-10-19 (×2): 5 mg via ORAL
  Filled 2024-10-18 (×2): qty 1

## 2024-10-18 MED ORDER — BUTALBITAL-APAP-CAFFEINE 50-325-40 MG PO TABS
2.0000 | ORAL_TABLET | Freq: Four times a day (QID) | ORAL | Status: DC | PRN
  Administered 2024-10-18 – 2024-10-19 (×3): 2 via ORAL
  Filled 2024-10-18 (×3): qty 2

## 2024-10-18 MED ORDER — GABAPENTIN 300 MG PO CAPS
600.0000 mg | ORAL_CAPSULE | Freq: Three times a day (TID) | ORAL | Status: DC
  Administered 2024-10-18 – 2024-10-19 (×2): 600 mg via ORAL
  Filled 2024-10-18 (×2): qty 2

## 2024-10-18 MED ORDER — ATORVASTATIN CALCIUM 80 MG PO TABS
80.0000 mg | ORAL_TABLET | Freq: Every day | ORAL | Status: DC
  Administered 2024-10-18 – 2024-10-19 (×2): 80 mg via ORAL
  Filled 2024-10-18 (×2): qty 1

## 2024-10-18 MED ORDER — ACETAMINOPHEN 325 MG PO TABS
650.0000 mg | ORAL_TABLET | Freq: Four times a day (QID) | ORAL | Status: DC | PRN
  Administered 2024-10-18: 650 mg via ORAL
  Filled 2024-10-18: qty 2

## 2024-10-18 MED ORDER — NICOTINE POLACRILEX 2 MG MT GUM: 2.0000 mg | CHEWING_GUM | OROMUCOSAL | Status: DC | PRN

## 2024-10-18 NOTE — Progress Notes (Signed)
 [facility]  PROGRESS NOTE  Vanessa Payne FMW:[id number] DOB: 08-26-1971 DOA: 10/17/2024 PCP: Garald Karlynn GAILS, MD   Brief HPI:   54 y.o. female with medical history significant of migraine, asthma, chronic low back pain, anxiety, depression, GERD, gastric ulcer, tobacco abuse, B12 deficiency, daytime hypersomnia, cervical radiculopathy presented to the ED with complaints of dizziness, nausea, vomiting, and headache since yesterday morning.     Assessment/Plan:   Acute cerebellar stroke -Presented with headache and vertigo since morning of 10/17/2024 -CT head and neck showed occlusion of the mid to distal left P2 branch vessel of indeterminate acuity, severe stenosis of both vertebral artery origins, 65% stenosis of proximal right ICA -Brain MRI showed small acute inferior left cerebellar infarct -Out of window for IV thrombolytics at time of presentation -Started on Plavix  75 mg daily; neurology consulted -2D echo showed EF of 60-65% -LDL 192; started on atorvastatin  80 mg daily -PT/OT - Passed swallow screen, will start diet today  Cigarette smoking NicoDerm patch and counseled to quit.   Mild normal anion gap metabolic acidosis Continue gentle IV fluid hydration and repeat labs in the morning.   Asthma Stable, no signs of acute exacerbation.  Continue albuterol  PRN.   Chronic pain Continue gabapentin .   Anxiety/depression Continue mirtazapine , Effexor , and Xanax  PRN.   GERD/PUD Continue Protonix  and sucralfate .        DVT prophylaxis: Lovenox .  Stop carvedilol. Urinalysis?  Medical:  [place] Code:  Relevant    Medications      stroke: early stages of recovery book   Does not apply Once   clopidogrel   75 mg Oral Daily   enoxaparin  (LOVENOX ) injection  30 mg Subcutaneous Q24H   mirtazapine   15 mg Oral QHS   nicotine   14 mg Transdermal Daily   pantoprazole   40 mg Oral BID   sucralfate   1 g Oral TID   venlafaxine  XR  150 mg Oral Q breakfast      Data Reviewed:   CBG:  Recent Labs  Lab 10/17/24 1257  GLUCAP 99    SpO2: 96 % O2 Flow Rate (L/min): 2 L/min    Vitals:   10/17/24 2122 10/18/24 0128 10/18/24 0622 10/18/24 0628  BP: 131/78 132/78    Pulse: 80 84    Resp: 18 18    Temp: 97.9 F (36.6 C) 97.9 F (36.6 C)    TempSrc: Oral Oral    SpO2: 100% 94% (S) (!) 88% 96%  Weight:      Height:          Data Reviewed:  Basic Metabolic Panel: Recent Labs  Lab 10/17/24 1242 10/18/24 0635  NA 142 142  K 3.5 3.7  CL 109 113*  CO2 20* 21*  GLUCOSE 94 102*  BUN 9 12  CREATININE 0.84 0.67  CALCIUM  9.4 8.8*    CBC: Recent Labs  Lab 10/17/24 1242  WBC 7.2  HGB 13.3  HCT 41.3  MCV 102.0*  PLT 449*    LFT Recent Labs  Lab 10/17/24 1242  AST 23  ALT 22  ALKPHOS 110  BILITOT <0.2  PROT 7.3  ALBUMIN 3.9     Antibiotics: Anti-infectives (From admission, onward)    None        CONSULTS neurology  Code Status: Full code  Family Communication: No family present at bedside     Subjective   Patient seen and examined.  Denies dizziness.   Objective    Physical Examination:   General-appears in no  acute distress Heart-S1-S2, regular, no murmur auscultated Lungs-clear to auscultation bilaterally, no wheezing or crackles auscultated Abdomen-soft, nontender, no organomegaly Extremities-no edema in the lower extremities Neuro-alert, oriented x3, no focal deficit noted            Baptiste Littler S Elyjah Hazan   [facility] If 7PM-7AM, please contact night-coverage at [email or web address], Office  [phone]   10/18/2024, 8:22 AM  LOS: 0 days

## 2024-10-18 NOTE — Plan of Care (Signed)
" °  Problem: Education: Goal: Knowledge of disease or condition will improve Outcome: Progressing Goal: Knowledge of patient specific risk factors will improve (DELETE if not current risk factor) Outcome: Progressing   Problem: Ischemic Stroke/TIA Tissue Perfusion: Goal: Complications of ischemic stroke/TIA will be minimized Outcome: Progressing   Problem: Coping: Goal: Will verbalize positive feelings about self Outcome: Progressing   Problem: Self-Care: Goal: Ability to participate in self-care as condition permits will improve Outcome: Progressing Goal: Verbalization of feelings and concerns over difficulty with self-care will improve Outcome: Progressing   Problem: Nutrition: Goal: Risk of aspiration will decrease Outcome: Progressing   "

## 2024-10-18 NOTE — Progress Notes (Signed)
 PT Cancellation Note  Patient Details Name: Vanessa Payne MRN: [id number] DOB: November 05, 1970   Cancelled Treatment:    Reason Eval/Treat Not Completed: (P) Patient at procedure or test/unavailable Pt is off the floor for HD. PT will follow back for Evaluation later this afternoon as able.  Madai Nuccio B. Fleeta Lapidus PT, DPT Acute Rehabilitation Services Please use secure chat or  Call Office [phone]    Almarie KATHEE Fleeta Fleet 10/18/2024, 8:20 AM

## 2024-10-18 NOTE — Telephone Encounter (Signed)
 Noted.  Vanessa Payne was hospitalized with cerebellar CVA.  Thanks

## 2024-10-18 NOTE — ED Notes (Addendum)
 Noted that patient's oxygen saturation dropped to 88% while patient was sleeping. Primary RN notified and patient placed on 2L [place] at this time.

## 2024-10-18 NOTE — Progress Notes (Signed)
 2D Echocardiogram completed

## 2024-10-18 NOTE — ED Notes (Signed)
 Pt remains gone to Echo.

## 2024-10-18 NOTE — ED Notes (Signed)
 Pt returned to ED from [facility].

## 2024-10-18 NOTE — Plan of Care (Signed)
   Problem: Education: Goal: Knowledge of disease or condition will improve Outcome: Progressing Goal: Knowledge of secondary prevention will improve (MUST DOCUMENT ALL) Outcome: Progressing Goal: Knowledge of patient specific risk factors will improve (DELETE if not current risk factor) Outcome: Progressing
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
## 2024-10-18 NOTE — Evaluation (Signed)
 Physical Therapy Evaluation Patient Details Name: Vanessa Payne MRN: [id number] DOB: October 27, 1970 Today's Date: 10/18/2024  History of Present Illness  54 yo F adm 1/6 dizziness nausea vomiting and HA MRI (+) small acute inferior L cerebellar infarct. PMH: asthma ,migraines, chronic low back pain, anxiety, depression, GERD, gastric ulcer, tobacco abuse, daytime hypersomnia cervical radiculopathy.  Clinical Impression  PTA pt reports independence with community distance ambulation and ADLs and iADLs. Pt reports feeling better since her hospitalization especially since her headache pain has subsided but experiences occasional bouts of dizziness. Pt is mod I for bed mobility and independent for transfers, and stairs. With straight line ambulation pt is able to move at a supervision level, however pt reports she is slower that her normal, but with administration of DGI balance test pt requiring CGA. Pt experiencing dizziness and minor balance deficits with activities which required head turns. Pt scored 19/24 on the DGI which is indicative of a increased risk of falls in older adults. With return to room PT performed a Vestibular screen and pt noted to have dizziness with finger follow and possible beat of nystagmus with head turn to R. PT will request Vestibular Exam for tomorrow, and OP Neuro PT to work on balance at discharge.       If plan is discharge home, recommend the following: A little help with walking and/or transfers;Assist for transportation   Can travel by private vehicle    Yes    Equipment Recommendations None recommended by PT  Recommendations for Other Services  Other (comment) (Vestibular Exam)    Functional Status Assessment Patient has had a recent decline in their functional status and demonstrates the ability to make significant improvements in function in a reasonable and predictable amount of time.     Precautions / Restrictions Precautions Precautions: None       Mobility  Bed Mobility Overal bed mobility: Modified Independent                  Transfers Overall transfer level: Independent Equipment used: None                    Ambulation/Gait Ambulation/Gait assistance: Supervision, Contact guard assist Gait Distance (Feet): 1000 Feet Assistive device: None Gait Pattern/deviations: WFL(Within Functional Limits), Drifts right/left Gait velocity: WFL but pt reports slower that her normal gait speed Gait velocity interpretation: 1.31 - 2.62 ft/sec, indicative of limited community ambulator   General Gait Details: pt straight line ambulation at a supervision level but balance challenges that involve head turns cause slowed gait with driftin usually to R  Stairs Stairs: Yes Stairs assistance: Independent Stair Management: No rails, Forwards Number of Stairs: 3 General stair comments: steady ascent and descent of steps without use of rails     Modified Rankin (Stroke Patients Only) Modified Rankin (Stroke Patients Only) Pre-Morbid Rankin Score: No symptoms Modified Rankin: No significant disability     Balance Overall balance assessment: Needs assistance   Sitting balance-Leahy Scale: Normal       Standing balance-Leahy Scale: Good                   Standardized Balance Assessment Standardized Balance Assessment : Dynamic Gait Index   Dynamic Gait Index Level Surface: Normal Change in Gait Speed: Mild Impairment Gait with Horizontal Head Turns: Mild Impairment Gait with Vertical Head Turns: Mild Impairment Gait and Pivot Turn: Mild Impairment Step Over Obstacle: Normal Step Around Obstacles: Mild Impairment Steps: Normal Total Score: 19  Pertinent Vitals/Pain Pain Assessment Pain Assessment: 0-10 Pain Score: 4  Pain Location: headache Pain Descriptors / Indicators: Headache Pain Intervention(s): Limited activity within patient's tolerance, Monitored during session, Repositioned     Home Living Family/patient expects to be discharged to:: Private residence Living Arrangements: Spouse/significant other;Children (ex husband and 25 son) Available Help at Discharge: Family;Available 24 hours/day Type of Home: Apartment Home Access: Level entry       Home Layout: One level        Prior Function Prior Level of Function : Independent/Modified Independent;Driving                     Extremity/Trunk Assessment   Upper Extremity Assessment Upper Extremity Assessment: Defer to OT evaluation    Lower Extremity Assessment Lower Extremity Assessment: Overall WFL for tasks assessed    Cervical / Trunk Assessment Cervical / Trunk Assessment: Normal  Communication   Communication Communication: No apparent difficulties    Cognition Arousal: Alert Behavior During Therapy: WFL for tasks assessed/performed                             Following commands: Intact       Cueing Cueing Techniques: Verbal cues     General Comments General comments (skin integrity, edema, etc.): After imbalance was determined with DGI especially in tasks that require head turns pt found to experience dizziness with finger follow at increased speed        Assessment/Plan    PT Assessment Patient needs continued PT services  PT Problem List Decreased balance       PT Treatment Interventions Gait training;Balance training;Neuromuscular re-education    PT Goals (Current goals can be found in the Care Plan section)  Acute Rehab PT Goals PT Goal Formulation: With patient Time For Goal Achievement: 11/01/24 Potential to Achieve Goals: Fair    Frequency Min 2X/week        AM-PAC PT 6 Clicks Mobility  Outcome Measure Help needed turning from your back to your side while in a flat bed without using bedrails?: None Help needed moving from lying on your back to sitting on the side of a flat bed without using bedrails?: None Help needed moving to and  from a bed to a chair (including a wheelchair)?: None Help needed standing up from a chair using your arms (e.g., wheelchair or bedside chair)?: None Help needed to walk in hospital room?: None Help needed climbing 3-5 steps with a railing? : A Little 6 Click Score: 23    End of Session Equipment Utilized During Treatment: Gait belt Activity Tolerance: Patient tolerated treatment well Patient left: in bed;with call bell/phone within reach;with bed alarm set Nurse Communication: Mobility status PT Visit Diagnosis: Dizziness and giddiness (R42);Unsteadiness on feet (R26.81)    Time: 8456-8394 PT Time Calculation (min) (ACUTE ONLY): 22 min   Charges:   PT Evaluation $PT Eval Low Complexity: 1 Low   PT General Charges $$ ACUTE PT VISIT: 1 Visit         Falicia Lizotte B. Payne Lapidus PT, DPT Acute Rehabilitation Services Please use secure chat or  Call Office [phone]   Vanessa Payne [facility] 10/18/2024, 4:38 PM

## 2024-10-18 NOTE — Progress Notes (Signed)
 Received patient on unit. Vitals and tele placed in hallway as we wait on clean room. CCMD called. Pt comfortable and reports no pain .

## 2024-10-18 NOTE — Progress Notes (Addendum)
 STROKE TEAM PROGRESS NOTE    INTERIM HISTORY/SUBJECTIVE  Patient is made hemodynamically stable and afebrile overnight.  She continues to report 10 out of 10 frontal headache and states that previous headache cocktail only took the edge off of it.  She states that dizziness continues but nausea has resolved.  OBJECTIVE  CBC    Component Value Date/Time   WBC 7.2 10/17/2024 1242   RBC 4.05 10/17/2024 1242   HGB 13.3 10/17/2024 1242   HCT 41.3 10/17/2024 1242   PLT 449 (H) 10/17/2024 1242   MCV 102.0 (H) 10/17/2024 1242   MCH 32.8 10/17/2024 1242   MCHC 32.2 10/17/2024 1242   RDW 16.7 (H) 10/17/2024 1242   LYMPHSABS 2.6 09/04/2024 0946   MONOABS 0.5 09/04/2024 0946   EOSABS 0.1 09/04/2024 0946   BASOSABS 0.0 09/04/2024 0946    BMET    Component Value Date/Time   NA 142 10/18/2024 0635   K 3.7 10/18/2024 0635   CL 113 (H) 10/18/2024 0635   CO2 21 (L) 10/18/2024 0635   GLUCOSE 102 (H) 10/18/2024 0635   BUN 12 10/18/2024 0635   CREATININE 0.67 10/18/2024 0635   CALCIUM  8.8 (L) 10/18/2024 0635   GFRNONAA >60 10/18/2024 0635    IMAGING past 24 hours ECHOCARDIOGRAM COMPLETE Result Date: 10/18/2024    ECHOCARDIOGRAM REPORT   Patient Name:   Vanessa Payne Date of Exam: 10/18/2024 Medical Rec #:  [id number]        Height:       65.0 in Accession #:    [id number]       Weight:       100.0 lb Date of Birth:  12-04-1970        BSA:          1.473 m Patient Age:    53 years         BP:           133/62 mmHg Patient Gender: F                HR:           70 bpm. Exam Location:  Inpatient Procedure: 2D Echo, Cardiac Doppler, Color Doppler and Intracardiac            Opacification Agent (Both Spectral and Color Flow Doppler were            utilized during procedure). Indications:   CVA  History:       Patient has no prior history of Echocardiogram examinations.                Stroke.  Sonographer:   Carmelita Hartshorn RDCS, FE, PE Referring      [phone] EDITHA RAM Phys: IMPRESSIONS  1. Left  ventricular ejection fraction, by estimation, is 60 to 65%. The left ventricle has normal function. The left ventricle has no regional wall motion abnormalities. Left ventricular diastolic parameters were normal.  2. Right ventricular systolic function is normal. The right ventricular size is normal.  3. The mitral valve is normal in structure. No evidence of mitral valve regurgitation. No evidence of mitral stenosis.  4. The aortic valve is normal in structure. Aortic valve regurgitation is not visualized. No aortic stenosis is present.  5. The inferior vena cava is normal in size with greater than 50% respiratory variability, suggesting right atrial pressure of 3 mmHg. FINDINGS  Left Ventricle: Left ventricular ejection fraction, by estimation, is 60 to 65%. The left ventricle has normal function.  The left ventricle has no regional wall motion abnormalities. Definity  contrast agent was given IV to delineate the left ventricular  endocardial borders. The left ventricular internal cavity size was normal in size. There is no left ventricular hypertrophy. Left ventricular diastolic parameters were normal. Right Ventricle: The right ventricular size is normal. No increase in right ventricular wall thickness. Right ventricular systolic function is normal. Left Atrium: Left atrial size was normal in size. Right Atrium: Right atrial size was normal in size. Pericardium: There is no evidence of pericardial effusion. Mitral Valve: The mitral valve is normal in structure. No evidence of mitral valve regurgitation. No evidence of mitral valve stenosis. Tricuspid Valve: The tricuspid valve is normal in structure. Tricuspid valve regurgitation is not demonstrated. No evidence of tricuspid stenosis. Aortic Valve: The aortic valve is normal in structure. Aortic valve regurgitation is not visualized. No aortic stenosis is present. Pulmonic Valve: The pulmonic valve was normal in structure. Pulmonic valve regurgitation is not  visualized. No evidence of pulmonic stenosis. Aorta: The aortic root is normal in size and structure. Venous: The inferior vena cava is normal in size with greater than 50% respiratory variability, suggesting right atrial pressure of 3 mmHg. IAS/Shunts: No atrial level shunt detected by color flow Doppler.  LEFT VENTRICLE PLAX 2D LVIDd:         4.00 cm   Diastology LVIDs:         2.90 cm   LV e' medial:    7.29 cm/s LV PW:         0.80 cm   LV E/e' medial:  11.7 LV IVS:        0.70 cm   LV e' lateral:   8.38 cm/s LVOT diam:     2.00 cm   LV E/e' lateral: 10.2 LV SV:         59 LV SV Index:   40 LVOT Area:     3.14 cm  RIGHT VENTRICLE RV S prime:     10.20 cm/s TAPSE (M-mode): 2.2 cm LEFT ATRIUM             Index        RIGHT ATRIUM          Index LA diam:        2.80 cm 1.90 cm/m   RA Area:     7.93 cm LA Vol (A2C):   23.4 ml 15.88 ml/m  RA Volume:   13.70 ml 9.30 ml/m LA Vol (A4C):   21.5 ml 14.59 ml/m LA Biplane Vol: 24.1 ml 16.36 ml/m  AORTIC VALVE LVOT Vmax:   90.70 cm/s LVOT Vmean:  56.400 cm/s LVOT VTI:    0.188 m  AORTA Ao Root diam: 2.70 cm MITRAL VALVE               TRICUSPID VALVE MV Area (PHT): 3.12 cm    TR Peak grad:   10.8 mmHg MV Decel Time: 243 msec    TR Vmax:        164.00 cm/s MV E velocity: 85.30 cm/s MV A velocity: 74.60 cm/s  SHUNTS MV E/A ratio:  1.14        Systemic VTI:  0.19 m                            Systemic Diam: 2.00 cm Morene Brownie Electronically signed by Morene Brownie Signature Date/Time: 10/18/2024/11:06:49 AM    Final    MR  BRAIN WO CONTRAST Result Date: 10/17/2024 EXAM: MRI Brain Without Contrast 10/17/2024 06:58:07 PM TECHNIQUE: Multiplanar multisequence MRI of the head/brain was performed without the administration of intravenous contrast. COMPARISON: CTA head/neck earlier today CLINICAL HISTORY: Headache, neuro deficit; dizziness discerning for a neurologic etiology. Impressive carotid bruit on left side but not on the right. FINDINGS: BRAIN AND VENTRICLES: Small  acute inferior left cerebellar infarct. No intracranial hemorrhage. No mass. No midline shift. No hydrocephalus. The sella is unremarkable. Normal flow voids. ORBITS: No acute abnormality. SINUSES AND MASTOIDS: No acute abnormality. BONES AND SOFT TISSUES: Normal marrow signal. No acute soft tissue abnormality. IMPRESSION: 1. Small acute inferior left cerebellar infarct. Electronically signed by: Gilmore Molt 10/17/2024 07:23 PM EST RP Workstation: [id number]   CT ANGIO HEAD NECK W WO CM Result Date: 10/17/2024 EXAM: CT HEAD WITHOUT CTA HEAD AND NECK WITH AND WITHOUT 10/17/2024 02:30:28 PM TECHNIQUE: CTA of the head and neck was performed with and without the administration of intravenous contrast. Noncontrast CT of the head with reconstructed 2-D images are also provided for review. Multiplanar 2D and/or 3D reformatted images are provided for review. Automated exposure control, iterative reconstruction, and/or weight based adjustment of the mA/kV was utilized to reduce the radiation dose to as low as reasonably achievable. 75 mL of iohexol  (OMNIPAQUE ) 350 MG/ML injection was administered. COMPARISON: Head CT 11/01/2020 and MRI 06/06/2014 CLINICAL HISTORY: Very loud left carotid bruit, room spinning dizziness since yesterday unchanged. No history of vertigo. Headache like her migraines. Rule out stroke or neurovascular disease. FINDINGS: CT HEAD: BRAIN AND VENTRICLES: There is no evidence of an acute infarct, intracranial hemorrhage, mass, midline shift, hydrocephalus, or extra-axial fluid collection. Cerebral volume is normal. ORBITS: No acute abnormality. SINUSES AND MASTOIDS: No acute abnormality. CTA NECK: AORTIC ARCH AND ARCH VESSELS: No dissection or arterial injury. Prominent soft plaque throughout the left subclavian artery with multifocal plaque ulceration resulting in moderate to severe stenoses both proximal and distal to the left vertebral artery origin. Only at most mild atherosclerosis in the  right subclavian artery without a significant stenosis. CERVICAL CAROTID ARTERIES: Scattered soft plaque in the common carotid and proximal internal carotid arteries with mild plaque ulceration in both carotid bulbs. 65% stenosis of the proximal right ICA. Moderate narrowing of both proximal external carotid arteries. No significant left sided cervical ICA stenosis. Motion artifact limits assessment of the proximal to mid cervical ICAs, particularly on the right. CERVICAL VERTEBRAL ARTERIES: The vertebral arteries are patent and codominant with severe origin stenoses bilaterally. No dissection or arterial injury. LUNGS AND MEDIASTINUM: Unremarkable. SOFT TISSUES: No acute abnormality. BONES: Mild cervical spondylosis. CTA HEAD: ANTERIOR CIRCULATION: The intracranial internal carotid arteries are widely patent. An infundibulum is noted at the origin of the right ophthalmic artery. There is a 1.5 mm protrusion from the right supraclinoid ICA, without a visible associated vessel and compatible with an aneurysm. ACAs and MCAs are patent without evidence of a proximal branch occlusion or significant proximal stenosis. The right A1 segment is hypoplastic. POSTERIOR CIRCULATION: The intracranial vertebral arteries are patent to the basilar with a mild to moderate stenosis of the mid to distal left V4 segment. The basilar artery is widely patent. Posterior communicating arteries are diminutive or absent. Both PCAs are patent proximally without evidence of a significant proximal stenosis. There is occlusion of a mid to distal left P2 branch vessel. OTHER: No dural venous sinus thrombosis on this non-dedicated study. IMPRESSION: 1. No evidence of an acute infarct or intracranial hemorrhage. 2. Occlusion  of a mid to distal left P2 branch vessel, of indeterminate acuity. 3. Prominent soft plaque throughout the left subclavian artery with multifocal plaque ulceration and moderate to severe stenoses. 4. Severe stenoses of both  vertebral artery origins. Mild to moderate left V4 stenosis. 5. 65% stenosis of the proximal right ICA. 6. 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 Electronically signed by: Dasie Hamburg MD 10/17/2024 03:10 PM EST RP Workstation: [id number]    Vitals:   10/18/24 9371 10/18/24 0823 10/18/24 1315 10/18/24 1357  BP:  133/62 (!) 142/68 (!) 151/76  Pulse:  67 68 78  Resp:  16 14 16   Temp:  97.9 F (36.6 C) 97.8 F (36.6 C) 98.2 F (36.8 C)  TempSrc:  Oral Oral Oral  SpO2: 96% 99% 99% 98%  Weight:      Height:         PHYSICAL EXAM General:  Alert, well-nourished, well-developed patient in no acute distress Psych:  Mood and affect appropriate for situation Respiratory:  Regular, unlabored respirations on room air   NEURO:  Mental Status: AA&Ox3, patient is able to give clear and coherent history Speech/Language: speech is without dysarthria or aphasia.    Cranial Nerves:  II: PERRL. Visual fields full.  III, IV, VI: EOMI. Eyelids elevate symmetrically.  V: Sensation is intact to light touch and symmetrical to face.  VII: Face is symmetrical resting and smiling VIII: hearing intact to voice. IX, X: Phonation is normal.  KP:Dynloizm shrug 5/5. XII: tongue is midline without fasciculations. Motor: 5/5 strength to all muscle groups tested.  Tone: is normal and bulk is normal Sensation- Intact to light touch bilaterally.  Coordination: FTN intact bilaterally, HKS: Slight ataxia in right lower extremity Gait- deferred  Most Recent NIH  1a Level of Conscious.: 0 1b LOC Questions: 0 1c LOC Commands: 0 2 Best Gaze: 0 3 Visual: 0 4 Facial Palsy: 0 5a Motor Arm - left: 0 5b Motor Arm - Right: 0 6a Motor Leg - Left: 0 6b Motor Leg - Right: 0 7 Limb Ataxia: 1 8 Sensory: 0 9 Best Language: 0 10 Dysarthria: 0 11 Extinct. and Inatten.: 0 TOTAL: 1   ASSESSMENT/PLAN  [name] is a 54 y.o. female with history of depression, anxiety, migraines and tobacco abuse  admitted for dizziness, nausea and unsteady gait.  She was found to have a small left cerebellar stroke on MRI.  NIH on Admission 0  Stroke: Small left cerebellar stroke, etiology likely small vessel disease given iCAD CT head No acute abnormality.  CTA head & neck occlusion of mid to distal left P2 branch vessel, indeterminate acuity, soft plaque throughout left subclavian artery with multifocal ulceration and moderate to severe stenosis, severe stenoses of bilateral vertebral artery origins, mild to moderate left V4 stenosis, 65% stenosis of proximal right ICA MRI small acute inferior left cerebellar infarct 2D Echo EF 60 to 65%, normal left atrial size, no atrial level shunt LDL 192 HgbA1c 5.4 VTE prophylaxis -Lovenox  No antithrombotic prior to admission, now on clopidogrel  75 mg daily, continue on discharge. Given Toradol allergy, there is worry for cross-reactivity with aspirin Therapy recommendations:  Pending Disposition: Pending  Hypertension Home meds: None Stable Long term BP goal normotensive  Hyperlipidemia Home meds: None LDL 192, goal < 70 Add atorvastatin  80 mg daily Continue statin at discharge  Tobacco Abuse Patient smokes 1 packs per day for 21 years Nicotine  replacement therapy provided Pt is willing to quit  Other Stroke Risk Factors None  Other Active Problems Severe frontal headache and patient with history of migraines-continue Fioricet  Unintentional weight loss - 30-40lbs in 3 months, primary team made aware and work up underway.   Hospital day # 0  Patient seen by NP and then by MD, MD to edit note as needed. Cortney E Everitt Clint Kill , MSN, AGACNP-BC [facility] See Amion for schedule and pager information 10/18/2024 2:16 PM   ATTENDING NOTE: I reviewed above note and agree with the assessment and plan. Pt was seen and examined.   No family at the bedside. Pt stated that her symptoms have resolved. Neuro exam intact. Her stroke more  likely small vessel disease in the setting of multifocal iCAD. Will continue plavix  indefinitely, continue high dose statin and quit smoking. PT and OT pending.   For detailed assessment and plan, please refer to above as I have made changes wherever appropriate.   Neurology will sign off. Please call with questions. Pt will follow up with Dr. Georjean at [facility] in about 4 weeks. Thanks for the consult.   Ary Cummins, MD PhD Stroke Neurology 10/18/2024 4:11 PM    To contact Stroke Continuity provider, please refer to [email or web address]. After hours, contact General Neurology

## 2024-10-18 NOTE — Evaluation (Signed)
 Occupational Therapy Evaluation Patient Details Name: Vanessa Payne MRN: [id number] DOB: October 08, 1971 Today's Date: 10/18/2024   History of Present Illness   54 yo F adm 1/6 dizziness nausea vomiting and HA MRI (+) small acute inferior L cerebellar infarct. PMH: asthma ,migraines, chronic low back pain, anxiety, depression, GERD, gastric ulcer, tobacco abuse, daytime hypersomnia cervical radiculopathy.     Clinical Impressions Pt is functioning at her baseline independence. Reports that once her headache pain was managed, dizziness dissipated. Educated in s/s of stroke and importance of immediate medical attention. No further OT needs.      If plan is discharge home, recommend the following:         Functional Status Assessment   Patient has not had a recent decline in their functional status     Equipment Recommendations   None recommended by OT     Recommendations for Other Services         Precautions/Restrictions   Precautions Precautions: None     Mobility Bed Mobility Overal bed mobility: Modified Independent                  Transfers Overall transfer level: Independent Equipment used: None                      Balance Overall balance assessment: Needs assistance   Sitting balance-Leahy Scale: Normal       Standing balance-Leahy Scale: Good                             ADL either performed or assessed with clinical judgement   ADL Overall ADL's : Independent                                             Vision Patient Visual Report: No change from baseline       Perception         Praxis         Pertinent Vitals/Pain Pain Assessment Pain Assessment: No/denies pain     Extremity/Trunk Assessment Upper Extremity Assessment Upper Extremity Assessment: Overall WFL for tasks assessed;Right hand dominant   Lower Extremity Assessment Lower Extremity Assessment: Defer to PT  evaluation   Cervical / Trunk Assessment Cervical / Trunk Assessment: Normal   Communication Communication Communication: No apparent difficulties   Cognition Arousal: Alert Behavior During Therapy: WFL for tasks assessed/performed Cognition: No apparent impairments                               Following commands: Intact       Cueing  General Comments   Cueing Techniques: Verbal cues      Exercises     Shoulder Instructions      Home Living Family/patient expects to be discharged to:: Private residence Living Arrangements: Spouse/significant other;Children (ex husband and 25 son) Available Help at Discharge: Family;Available 24 hours/day Type of Home: Apartment Home Access: Level entry     Home Layout: One level     Bathroom Shower/Tub: Chief Strategy Officer: Standard                Prior Functioning/Environment Prior Level of Function : Independent/Modified Independent;Driving  OT Problem List:     OT Treatment/Interventions:        OT Goals(Current goals can be found in the care plan section)       OT Frequency:       Co-evaluation              AM-PAC OT 6 Clicks Daily Activity     Outcome Measure Help from another person eating meals?: None Help from another person taking care of personal grooming?: None Help from another person toileting, which includes using toliet, bedpan, or urinal?: None Help from another person bathing (including washing, rinsing, drying)?: None Help from another person to put on and taking off regular upper body clothing?: None Help from another person to put on and taking off regular lower body clothing?: None 6 Click Score: 24   End of Session    Activity Tolerance: Patient tolerated treatment well Patient left: in bed;with call bell/phone within reach  OT Visit Diagnosis: Dizziness and giddiness (R42)                Time: 8574-8552 OT Time  Calculation (min): 22 min Charges:  OT General Charges $OT Visit: 1 Visit OT Evaluation $OT Eval Low Complexity: 1 Low  Mliss HERO, OTR/L Acute Rehabilitation Services Office: [phone]   Kennth Mliss Helling 10/18/2024, 2:48 PM

## 2024-10-18 NOTE — ED Notes (Signed)
 Pt transported to [facility].

## 2024-10-19 DIAGNOSIS — I639 Cerebral infarction, unspecified: Secondary | ICD-10-CM | POA: Diagnosis not present

## 2024-10-19 DIAGNOSIS — Z720 Tobacco use: Secondary | ICD-10-CM | POA: Diagnosis not present

## 2024-10-19 MED ORDER — CLOPIDOGREL BISULFATE 75 MG PO TABS: 75.0000 mg | ORAL_TABLET | Freq: Every day | ORAL | 3 refills | Status: DC

## 2024-10-19 MED ORDER — NICOTINE 14 MG/24HR TD PT24: 14.0000 mg | MEDICATED_PATCH | Freq: Every day | TRANSDERMAL | 0 refills | Status: AC

## 2024-10-19 MED ORDER — ATORVASTATIN CALCIUM 80 MG PO TABS: 80.0000 mg | ORAL_TABLET | Freq: Every day | ORAL | 3 refills | Status: AC

## 2024-10-19 NOTE — Progress Notes (Signed)
 Physical Therapy Treatment Patient Details Name: Vanessa Payne MRN: [id number] DOB: 07-Nov-1970 Today's Date: 10/19/2024   History of Present Illness 54 yo F adm 1/6 dizziness nausea vomiting and HA MRI (+) small acute inferior L cerebellar infarct. PMH: asthma ,migraines, chronic low back pain, anxiety, depression, GERD, gastric ulcer, tobacco abuse, daytime hypersomnia cervical radiculopathy.    PT Comments  Patient seen for vestibular assessment and rehab. Able to walk at normal pace without head turns or with slow head turns and maintain straight path. Quick head turns cause dizziness and feeling of imbalance (no LOB observed). Educated on vestibular system and reason for progressing exercise from supported sitting to unsupported sitting to standing with single-finger support (no UE support was immediately too challenging). Handout provided and pt verbalized and demonstrated good understanding. Will benefit from Neuro rehab for Vestibular rehab on discharge.     If plan is discharge home, recommend the following:     Can travel by private vehicle        Equipment Recommendations  None recommended by PT    Recommendations for Other Services       Precautions / Restrictions Precautions Precautions: Fall Recall of Precautions/Restrictions: Intact     Mobility  Bed Mobility Overal bed mobility: Modified Independent                  Transfers Overall transfer level: Independent Equipment used: None                    Ambulation/Gait Ambulation/Gait assistance: Supervision Gait Distance (Feet): 300 Feet Assistive device: None Gait Pattern/deviations: WFL(Within Functional Limits), Drifts right/left Gait velocity: WFL but pt reports slower that her normal gait speed     General Gait Details: pt straight line ambulation at a independent level but balance challenges that involve head turns cause slowed gait with supervision (no drift or imbalance  today)   Stairs             Wheelchair Mobility     Tilt Bed    Modified Rankin (Stroke Patients Only) Modified Rankin (Stroke Patients Only) Pre-Morbid Rankin Score: No symptoms Modified Rankin: No significant disability     Balance Overall balance assessment: Needs assistance   Sitting balance-Leahy Scale: Normal       Standing balance-Leahy Scale: Good                              Communication Communication Communication: No apparent difficulties  Cognition Arousal: Alert Behavior During Therapy: WFL for tasks assessed/performed                             Following commands: Intact      Cueing Cueing Techniques: Verbal cues  Exercises Other Exercises Other Exercises: Unsupported seated VORx1 horizontal and vertical with mild symptoms after 1 minute Other Exercises: Standing with single finger support with VORx1 horizontal and vertical for 30 seconds with moderate dizziness    10/19/24 0001  Vestibular Treatment/Exercise  Vestibular Treatment Provided Gaze  Gaze Exercises X1 Viewing Horizontal;X1 Viewing Vertical  X1 Viewing Horizontal  Foot Position standing feet apart; 1 finger support  Time 0001  Reps 2  X1 Viewing Vertical  Foot Position standing feet apart; 1 finger support  Time 0001  Reps 2   Access Code: [email or web address] URL: [email or web address] Date: 10/19/2024 Prepared by: [facility]  Exercises - Standing VORx1  -  2-3 x daily - 7 x weekly - 1 sets - 3-5 reps - Standing Gaze Stabilization with Head Rotation  - 1 x daily - 5 x weekly - 1 sets - 10 reps - - hold   General Comments        Pertinent Vitals/Pain Pain Assessment Pain Assessment: 0-10 Pain Score: 9  Pain Location: headache Pain Descriptors / Indicators: Headache Pain Intervention(s): Limited activity within patient's tolerance, Monitored during session, Patient requesting pain meds-RN notified, RN gave pain meds during session     Home Living                          Prior Function            PT Goals (current goals can now be found in the care plan section) Acute Rehab PT Goals Time For Goal Achievement: 11/01/24 Potential to Achieve Goals: Fair Progress towards PT goals: Progressing toward goals    Frequency    Min 2X/week      PT Plan      Co-evaluation              AM-PAC PT 6 Clicks Mobility   Outcome Measure  Help needed turning from your back to your side while in a flat bed without using bedrails?: None Help needed moving from lying on your back to sitting on the side of a flat bed without using bedrails?: None Help needed moving to and from a bed to a chair (including a wheelchair)?: None Help needed standing up from a chair using your arms (e.g., wheelchair or bedside chair)?: None Help needed to walk in hospital room?: None Help needed climbing 3-5 steps with a railing? : None 6 Click Score: 24    End of Session Equipment Utilized During Treatment: Gait belt Activity Tolerance: Patient tolerated treatment well Patient left: in bed;with call bell/phone within reach;with bed alarm set Nurse Communication: Mobility status PT Visit Diagnosis: Dizziness and giddiness (R42);Unsteadiness on feet (R26.81)     Time: 9178-9152 PT Time Calculation (min) (ACUTE ONLY): 26 min  Charges:    $Therapeutic Exercise: 23-37 mins PT General Charges $$ ACUTE PT VISIT: 1 Visit                      Macario RAMAN, PT Acute Rehabilitation Services  Office [phone]    Macario SHAUNNA Soja 10/19/2024, 9:16 AM

## 2024-10-19 NOTE — Discharge Summary (Signed)
 " Physician Discharge Summary   Patient: Vanessa Payne MRN: [id number] DOB: 08/14/1971  Admit date:     10/17/2024  Discharge date: 10/19/2024  Discharge Physician: Vanessa Payne Brod   PCP: Vanessa Karlynn GAILS, MD   Recommendations at discharge:   Follow-up neurology, Dr. Georjean as outpatient Follow-up PCP in 2 weeks Patient will need outpatient PT, ambulatory referral placed  Discharge Diagnoses: Principal Problem:   Acute CVA (cerebrovascular accident) [facility]) Active Problems:   Anxiety disorder   Tobacco abuse   Depression   PUD (peptic ulcer disease)  Resolved Problems:   * No resolved hospital problems. *  Hospital Course: 54 y.o. female with medical history significant of migraine, asthma, chronic low back pain, anxiety, depression, GERD, gastric ulcer, tobacco abuse, B12 deficiency, daytime hypersomnia, cervical radiculopathy presented to the ED with complaints of dizziness, nausea, vomiting, and headache since yesterday morning.   Assessment and Plan:  Acute cerebellar stroke -Presented with headache and vertigo since morning of 10/17/2024 -CT head and neck showed occlusion of the mid to distal left P2 branch vessel of indeterminate acuity, severe stenosis of both vertebral artery origins, 65% stenosis of proximal right ICA -Brain MRI showed small acute inferior left cerebellar infarct -Out of window for IV thrombolytics at time of presentation -Started on Plavix  75 mg daily; neurology consulted -2D echo showed EF of 60-65% -LDL 192; started on atorvastatin  80 mg daily -PT/OT obtained, patient to get outpatient PT - Passed swallow screen, will start diet today -Neurology recommends to discharge home on Plavix  75 mg daily and atorvastatin  80 mg daily   Cigarette smoking NicoDerm patch and counseled to quit.   Mild normal anion gap metabolic acidosis Resolved with IV fluids   Asthma Stable, no signs of acute exacerbation.  Continue albuterol  PRN.   Chronic  pain Continue gabapentin .   Anxiety/depression Continue mirtazapine , Effexor , and Xanax  PRN.   GERD/PUD Continue Protonix  and sucralfate .        Consultants: Neurology Procedures performed: 2D echocardiogram Disposition: Home Diet recommendation:  Cardiac diet DISCHARGE MEDICATION: Allergies as of 10/19/2024       Reactions   Ketorolac Tromethamine Anaphylaxis   Tongue swells Pt can take other NSAIDs        Medication List     STOP taking these medications    acetaminophen -caffeine  500-65 MG Tabs per tablet Commonly known as: EXCEDRIN TENSION HEADACHE   celecoxib 100 MG capsule Commonly known as: CELEBREX   celecoxib 200 MG capsule Commonly known as: CELEBREX   diclofenac  75 MG EC tablet Commonly known as: VOLTAREN    furosemide  20 MG tablet Commonly known as: LASIX    HYDROcodone -acetaminophen  5-325 MG tablet Commonly known as: NORCO/VICODIN   oxyCODONE -acetaminophen  5-325 MG tablet Commonly known as: PERCOCET/ROXICET       TAKE these medications    ALPRAZolam  0.25 MG tablet Commonly known as: XANAX  Take 1 tablet (0.25 mg total) by mouth 2 (two) times daily as needed for anxiety. TAKE 1 TABLET BY MOUTH TWICE A DAY AS NEEDED FOR ANXIETY   atorvastatin  80 MG tablet Commonly known as: LIPITOR Take 1 tablet (80 mg total) by mouth daily. Start taking on: October 20, 2024   butalbital -acetaminophen -caffeine  50-325-40 MG tablet Commonly known as: FIORICET  Take 1 tablet by mouth 3 (three) times daily as needed for headache.   clopidogrel  75 MG tablet Commonly known as: PLAVIX  Take 1 tablet (75 mg total) by mouth daily. Start taking on: October 20, 2024   cyanocobalamin  1000 MCG/ML injection Commonly known as:  VITAMIN B12 INJECT 1 ML (1,000 MCG TOTAL) INTO THE MUSCLE EVERY 14 (FOURTEEN) DAYS.   gabapentin  300 MG capsule Commonly known as: NEURONTIN  TAKE 2 CAPSULES BY MOUTH 3 TIMES A DAY AS NEEDED   mirtazapine  30 MG tablet Commonly known as:  REMERON  Take 0.5-1 tablets (15-30 mg total) by mouth at bedtime. TAKE 1 TABLET BY MOUTH EVERY DAY AT BEDTIME   nicotine  14 mg/24hr patch Commonly known as: NICODERM CQ  - dosed in mg/24 hours Place 1 patch (14 mg total) onto the skin daily. Start taking on: October 20, 2024   ondansetron  4 MG tablet Commonly known as: Zofran  Take 1 tablet (4 mg total) by mouth every 8 (eight) hours as needed for nausea or vomiting.   pantoprazole  40 MG tablet Commonly known as: PROTONIX  Take 1 tablet (40 mg total) by mouth daily.   sucralfate  1 g tablet Commonly known as: CARAFATE  TAKE 1 TABLET BY MOUTH 3 TIMES DAILY.   venlafaxine  XR 150 MG 24 hr capsule Commonly known as: EFFEXOR -XR Take 1 capsule (150 mg total) by mouth daily with breakfast.   Ventolin  HFA 108 (90 Base) MCG/ACT inhaler Generic drug: albuterol  TAKE 2 PUFFS BY MOUTH EVERY 6 HOURS AS NEEDED FOR WHEEZE OR SHORTNESS OF BREATH   Vitamin D3 125 MCG (5000 UT) Caps Take 5,000 Units by mouth in the morning.        Follow-up Information     Vanessa Darice HERO, MD. Schedule an appointment as soon as possible for a visit in 1 month(s).   Specialty: Neurology Contact information: [address] [place] [address] [phone]         [facility]. Schedule an appointment as soon as possible for a visit.   Specialty: Rehabilitation Contact information: [address] [place]  [address] [phone]        Plotnikov, Karlynn GAILS, MD Follow up in 2 week(s).   Specialty: Internal Medicine Contact information: [address] [place] [address] [phone]                Discharge Exam: Vanessa Payne   10/17/24 1144  Weight: 45.4 kg   General-appears in no acute distress Heart-S1-S2, regular, no murmur auscultated Lungs-clear to auscultation bilaterally, no wheezing or crackles auscultated Abdomen-soft, nontender, no  organomegaly Extremities-no edema in the lower extremities Neuro-alert, oriented x3, no focal deficit noted  Condition at discharge: good  The results of significant diagnostics from this hospitalization (including imaging, microbiology, ancillary and laboratory) are listed below for reference.   Imaging Studies: CT CHEST LUNG CA SCREEN LOW DOSE W/O CM Result Date: 10/19/2024 CLINICAL DATA:  Current smoker with 21 pack-year smoking history. Baseline lung cancer screening. EXAM: CT CHEST WITHOUT CONTRAST LOW-DOSE FOR LUNG CANCER SCREENING TECHNIQUE: Multidetector CT imaging of the chest was performed following the standard protocol without IV contrast. RADIATION DOSE REDUCTION: This exam was performed according to the departmental dose-optimization program which includes automated exposure control, adjustment of the mA and/or kV according to patient size and/or use of iterative reconstruction technique. COMPARISON:  CT chest dated 11/01/2020 FINDINGS: Cardiovascular: Normal heart size. No significant pericardial fluid/thickening. Great vessels are normal in course and caliber. Coronary artery calcifications and aortic atherosclerosis. Mediastinum/Nodes: Imaged thyroid  gland without nodules meeting criteria for imaging follow-up by size. Normal esophagus. No pathologically enlarged axillary, supraclavicular, mediastinal, or hilar lymph nodes. Lungs/Pleura: The central airways are patent. Mild centrilobular and paraseptal emphysema. Mild diffuse bronchial  wall thickening. Bilateral lower lobe linear atelectasis/scarring, right-greater-than-left. No pneumothorax. No pleural effusion. Upper abdomen: Normal. Musculoskeletal: No acute or abnormal lytic or blastic osseous lesions. Old, healed sternal fracture deformity. IMPRESSION: 1. Lung-RADS 1, negative. Continue annual screening with low-dose chest CT without contrast in 12 months. 2. Mild diffuse bronchial wall thickening may represent bronchitis. 3. Aortic  Atherosclerosis (ICD10-I70.0) and Emphysema (ICD10-J43.9). Coronary artery calcifications. Assessment for potential risk factor modification, dietary therapy or pharmacologic therapy may be warranted, if clinically indicated. Electronically Signed   By: Limin  Xu M.D.   On: 10/19/2024 13:01   ECHOCARDIOGRAM COMPLETE Result Date: 10/18/2024    ECHOCARDIOGRAM REPORT   Patient Name:   DEVON KINGDON Date of Exam: 10/18/2024 Medical Rec #:  [id number]        Height:       65.0 in Accession #:    [id number]       Weight:       100.0 lb Date of Birth:  January 05, 1971        BSA:          1.473 m Patient Age:    53 years         BP:           133/62 mmHg Patient Gender: F                HR:           70 bpm. Exam Location:  Inpatient Procedure: 2D Echo, Cardiac Doppler, Color Doppler and Intracardiac            Opacification Agent (Both Spectral and Color Flow Doppler were            utilized during procedure). Indications:   CVA  History:       Patient has no prior history of Echocardiogram examinations.                Stroke.  Sonographer:   Carmelita Hartshorn RDCS, FE, PE Referring      [phone] EDITHA RAM Phys: IMPRESSIONS  1. Left ventricular ejection fraction, by estimation, is 60 to 65%. The left ventricle has normal function. The left ventricle has no regional wall motion abnormalities. Left ventricular diastolic parameters were normal.  2. Right ventricular systolic function is normal. The right ventricular size is normal.  3. The mitral valve is normal in structure. No evidence of mitral valve regurgitation. No evidence of mitral stenosis.  4. The aortic valve is normal in structure. Aortic valve regurgitation is not visualized. No aortic stenosis is present.  5. The inferior vena cava is normal in size with greater than 50% respiratory variability, suggesting right atrial pressure of 3 mmHg. FINDINGS  Left Ventricle: Left ventricular ejection fraction, by estimation, is 60 to 65%. The left ventricle has normal  function. The left ventricle has no regional wall motion abnormalities. Definity  contrast agent was given IV to delineate the left ventricular  endocardial borders. The left ventricular internal cavity size was normal in size. There is no left ventricular hypertrophy. Left ventricular diastolic parameters were normal. Right Ventricle: The right ventricular size is normal. No increase in right ventricular wall thickness. Right ventricular systolic function is normal. Left Atrium: Left atrial size was normal in size. Right Atrium: Right atrial size was normal in size. Pericardium: There is no evidence of pericardial effusion. Mitral Valve: The mitral valve is normal in structure. No evidence of mitral valve regurgitation. No evidence of mitral valve stenosis. Tricuspid Valve: The tricuspid  valve is normal in structure. Tricuspid valve regurgitation is not demonstrated. No evidence of tricuspid stenosis. Aortic Valve: The aortic valve is normal in structure. Aortic valve regurgitation is not visualized. No aortic stenosis is present. Pulmonic Valve: The pulmonic valve was normal in structure. Pulmonic valve regurgitation is not visualized. No evidence of pulmonic stenosis. Aorta: The aortic root is normal in size and structure. Venous: The inferior vena cava is normal in size with greater than 50% respiratory variability, suggesting right atrial pressure of 3 mmHg. IAS/Shunts: No atrial level shunt detected by color flow Doppler.  LEFT VENTRICLE PLAX 2D LVIDd:         4.00 cm   Diastology LVIDs:         2.90 cm   LV e' medial:    7.29 cm/s LV PW:         0.80 cm   LV E/e' medial:  11.7 LV IVS:        0.70 cm   LV e' lateral:   8.38 cm/s LVOT diam:     2.00 cm   LV E/e' lateral: 10.2 LV SV:         59 LV SV Index:   40 LVOT Area:     3.14 cm  RIGHT VENTRICLE RV S prime:     10.20 cm/s TAPSE (M-mode): 2.2 cm LEFT ATRIUM             Index        RIGHT ATRIUM          Index LA diam:        2.80 cm 1.90 cm/m   RA Area:      7.93 cm LA Vol (A2C):   23.4 ml 15.88 ml/m  RA Volume:   13.70 ml 9.30 ml/m LA Vol (A4C):   21.5 ml 14.59 ml/m LA Biplane Vol: 24.1 ml 16.36 ml/m  AORTIC VALVE LVOT Vmax:   90.70 cm/s LVOT Vmean:  56.400 cm/s LVOT VTI:    0.188 m  AORTA Ao Root diam: 2.70 cm MITRAL VALVE               TRICUSPID VALVE MV Area (PHT): 3.12 cm    TR Peak grad:   10.8 mmHg MV Decel Time: 243 msec    TR Vmax:        164.00 cm/s MV E velocity: 85.30 cm/s MV A velocity: 74.60 cm/s  SHUNTS MV E/A ratio:  1.14        Systemic VTI:  0.19 m                            Systemic Diam: 2.00 cm Morene Brownie Electronically signed by Morene Brownie Signature Date/Time: 10/18/2024/11:06:49 AM    Final    MR BRAIN WO CONTRAST Result Date: 10/17/2024 EXAM: MRI Brain Without Contrast 10/17/2024 06:58:07 PM TECHNIQUE: Multiplanar multisequence MRI of the head/brain was performed without the administration of intravenous contrast. COMPARISON: CTA head/neck earlier today CLINICAL HISTORY: Headache, neuro deficit; dizziness discerning for a neurologic etiology. Impressive carotid bruit on left side but not on the right. FINDINGS: BRAIN AND VENTRICLES: Small acute inferior left cerebellar infarct. No intracranial hemorrhage. No mass. No midline shift. No hydrocephalus. The sella is unremarkable. Normal flow voids. ORBITS: No acute abnormality. SINUSES AND MASTOIDS: No acute abnormality. BONES AND SOFT TISSUES: Normal marrow signal. No acute soft tissue abnormality. IMPRESSION: 1. Small acute inferior left cerebellar infarct. Electronically signed by: Gilmore  Jones 10/17/2024 07:23 PM EST RP Workstation: [id number]   CT ANGIO HEAD NECK W WO CM Result Date: 10/17/2024 EXAM: CT HEAD WITHOUT CTA HEAD AND NECK WITH AND WITHOUT 10/17/2024 02:30:28 PM TECHNIQUE: CTA of the head and neck was performed with and without the administration of intravenous contrast. Noncontrast CT of the head with reconstructed 2-D images are also provided for review.  Multiplanar 2D and/or 3D reformatted images are provided for review. Automated exposure control, iterative reconstruction, and/or weight based adjustment of the mA/kV was utilized to reduce the radiation dose to as low as reasonably achievable. 75 mL of iohexol  (OMNIPAQUE ) 350 MG/ML injection was administered. COMPARISON: Head CT 11/01/2020 and MRI 06/06/2014 CLINICAL HISTORY: Very loud left carotid bruit, room spinning dizziness since yesterday unchanged. No history of vertigo. Headache like her migraines. Rule out stroke or neurovascular disease. FINDINGS: CT HEAD: BRAIN AND VENTRICLES: There is no evidence of an acute infarct, intracranial hemorrhage, mass, midline shift, hydrocephalus, or extra-axial fluid collection. Cerebral volume is normal. ORBITS: No acute abnormality. SINUSES AND MASTOIDS: No acute abnormality. CTA NECK: AORTIC ARCH AND ARCH VESSELS: No dissection or arterial injury. Prominent soft plaque throughout the left subclavian artery with multifocal plaque ulceration resulting in moderate to severe stenoses both proximal and distal to the left vertebral artery origin. Only at most mild atherosclerosis in the right subclavian artery without a significant stenosis. CERVICAL CAROTID ARTERIES: Scattered soft plaque in the common carotid and proximal internal carotid arteries with mild plaque ulceration in both carotid bulbs. 65% stenosis of the proximal right ICA. Moderate narrowing of both proximal external carotid arteries. No significant left sided cervical ICA stenosis. Motion artifact limits assessment of the proximal to mid cervical ICAs, particularly on the right. CERVICAL VERTEBRAL ARTERIES: The vertebral arteries are patent and codominant with severe origin stenoses bilaterally. No dissection or arterial injury. LUNGS AND MEDIASTINUM: Unremarkable. SOFT TISSUES: No acute abnormality. BONES: Mild cervical spondylosis. CTA HEAD: ANTERIOR CIRCULATION: The intracranial internal carotid arteries  are widely patent. An infundibulum is noted at the origin of the right ophthalmic artery. There is a 1.5 mm protrusion from the right supraclinoid ICA, without a visible associated vessel and compatible with an aneurysm. ACAs and MCAs are patent without evidence of a proximal branch occlusion or significant proximal stenosis. The right A1 segment is hypoplastic. POSTERIOR CIRCULATION: The intracranial vertebral arteries are patent to the basilar with a mild to moderate stenosis of the mid to distal left V4 segment. The basilar artery is widely patent. Posterior communicating arteries are diminutive or absent. Both PCAs are patent proximally without evidence of a significant proximal stenosis. There is occlusion of a mid to distal left P2 branch vessel. OTHER: No dural venous sinus thrombosis on this non-dedicated study. IMPRESSION: 1. No evidence of an acute infarct or intracranial hemorrhage. 2. Occlusion of a mid to distal left P2 branch vessel, of indeterminate acuity. 3. Prominent soft plaque throughout the left subclavian artery with multifocal plaque ulceration and moderate to severe stenoses. 4. 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 Mild to moderate left V4 stenosis. 5. 65% stenosis of the proximal right ICA. 6. 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 Electronically signed by: Dasie Hamburg MD 10/17/2024 03:10 PM EST RP Workstation: [id number]    Microbiology: Results for orders placed or performed in visit on 11/14/19  Novel Coronavirus, NAA ([facility])     Status: None   Collection Time: 11/14/19  2:35 PM   Specimen: Nasopharyngeal(NP) swabs in vial transport medium   NASOPHARYNGE  TESTING  Result Value Ref Range Status   SARS-CoV-2, NAA Not Detected Not Detected Final    Comment: This nucleic acid amplification test was developed and its performance characteristics determined by [employer]. Nucleic acid amplification tests include RT-PCR and TMA. This test has not been FDA  cleared or approved. This test has been authorized by FDA under an Emergency Use Authorization (EUA). This test is only authorized for the duration of time the declaration that circumstances exist justifying the authorization of the emergency use of in vitro diagnostic tests for detection of SARS-CoV-2 virus and/or diagnosis of COVID-19 infection under section 564(b)(1) of the Act, 21 U.S.C. 639aaa-6(a) (1), unless the authorization is terminated or revoked sooner. When diagnostic testing is negative, the possibility of a false negative result should be considered in the context of a patient's recent exposures and the presence of clinical signs and symptoms consistent with COVID-19. An individual without symptoms of COVID-19 and who is not shedding SARS-CoV-2 virus wo uld expect to have a negative (not detected) result in this assay.     Labs: CBC: Recent Labs  Lab 10/17/24 1242  WBC 7.2  HGB 13.3  HCT 41.3  MCV 102.0*  PLT 449*   Basic Metabolic Panel: Recent Labs  Lab 10/17/24 1242 10/18/24 0635  NA 142 142  K 3.5 3.7  CL 109 113*  CO2 20* 21*  GLUCOSE 94 102*  BUN 9 12  CREATININE 0.84 0.67  CALCIUM  9.4 8.8*   Liver Function Tests: Recent Labs  Lab 10/17/24 1242  AST 23  ALT 22  ALKPHOS 110  BILITOT <0.2  PROT 7.3  ALBUMIN 3.9   CBG: Recent Labs  Lab 10/17/24 1257  GLUCAP 99    Discharge time spent: greater than 30 minutes.  Signed: Sabas Payne Brod, MD [facility] 10/19/2024 "

## 2024-10-19 NOTE — TOC Transition Note (Signed)
 Transition of Care [facility]) - Discharge Note   Patient Details  Name: SHEVONNE WOLF MRN: [id number] Date of Birth: [birth year]  Transition of Care [facility]) CM/SW Contact:  Andrez JULIANNA George, RN Phone Number: 10/19/2024, 10:43 AM   Clinical Narrative:     Pt is discharging home with outpatient therapy through [facility]. Referral sent to [facility]. Information on the AVS for pt to call and schedule the first appointment. Pt has walker at home.  She drives but son and ex spouse can assist with transportation.  She manages her own medications and denies any issues.   Family transporting her home.  Final next level of care: OP Rehab Barriers to Discharge: No Barriers Identified   Patient Goals and CMS Choice     Choice offered to / list presented to : Patient      Discharge Placement                       Discharge Plan and Services Additional resources added to the After Visit Summary for                                       Social Drivers of Health (SDOH) Interventions SDOH Screenings   Food Insecurity: No Food Insecurity (10/18/2024)  Housing: Low Risk (10/18/2024)  Transportation Needs: No Transportation Needs (10/18/2024)  Utilities: Not At Risk (10/18/2024)  Depression (PHQ2-9): Low Risk (03/02/2024)  Social Connections: Unknown (02/23/2022)   Received from [facility]  Tobacco Use: High Risk (10/17/2024)     Readmission Risk Interventions    03/17/2024    8:46 AM  Readmission Risk Prevention Plan  Post Dischage Appt Complete  Medication Screening Complete  Transportation Screening Complete

## 2024-10-19 NOTE — Progress Notes (Signed)
 Patient has been provided discharge instructions to include medications and follow up appointments.

## 2024-10-20 ENCOUNTER — Telehealth: Payer: Self-pay

## 2024-10-20 NOTE — Transitions of Care (Post Inpatient/ED Visit) (Signed)
 "  10/20/2024  Name: Vanessa Payne MRN: [id number] DOB: 11-Sep-1971  Today's TOC FU Call Status: Today's TOC FU Call Status:: Successful TOC FU Call Completed TOC FU Call Complete Date: 10/21/23  Patient's Name and Date of Birth confirmed.    Transition Care Management Follow-up Telephone Call Date of Discharge: 10/17/24 Discharge Facility: [facility]) Type of Discharge: Inpatient Admission How have you been since you were released from the hospital?: Same Any questions or concerns?: No  Items Reviewed: Did you receive and understand the discharge instructions provided?: Yes Medications obtained,verified, and reconciled?: Yes (Medications Reviewed) Any new allergies since your discharge?: No Dietary orders reviewed?: NA Do you have support at home?: Yes People in Home [RPT]: child(ren), dependent, significant other  Medications Reviewed Today: Medications Reviewed Today     Reviewed by Will Arnette SAILOR, CMA (Certified Medical Assistant) on 10/20/24 at 1431  Med List Status: <None>   Medication Order Taking? Sig Documenting Provider Last Dose Status Informant  ALPRAZolam  (XANAX ) 0.25 MG tablet [id number] Yes Take 1 tablet (0.25 mg total) by mouth 2 (two) times daily as needed for anxiety. TAKE 1 TABLET BY MOUTH TWICE A DAY AS NEEDED FOR ANXIETY Plotnikov, Aleksei V, MD  Active   atorvastatin  (LIPITOR) 80 MG tablet [id number] Yes Take 1 tablet (80 mg total) by mouth daily. Drusilla Sabas RAMAN, MD  Active   butalbital -acetaminophen -caffeine  (FIORICET ) 50-325-40 MG tablet [id number] Yes Take 1 tablet by mouth 3 (three) times daily as needed for headache. Plotnikov, Aleksei V, MD  Active   Cholecalciferol  (VITAMIN D3) 5000 UNITS CAPS [id number] Yes Take 5,000 Units by mouth in the morning. [provider]  Active Self, Pharmacy Records  clopidogrel  (PLAVIX ) 75 MG tablet [id number] Yes Take 1 tablet (75 mg total) by mouth daily. Drusilla Sabas RAMAN, MD  Active   cyanocobalamin  (VITAMIN  B12) 1000 MCG/ML injection [id number] Yes INJECT 1 ML (1,000 MCG TOTAL) INTO THE MUSCLE EVERY 14 (FOURTEEN) DAYS. Plotnikov, Aleksei V, MD  Active   gabapentin  (NEURONTIN ) 300 MG capsule [id number] Yes TAKE 2 CAPSULES BY MOUTH 3 TIMES A DAY AS NEEDED Plotnikov, Aleksei V, MD  Active   mirtazapine  (REMERON ) 30 MG tablet [id number] Yes Take 0.5-1 tablets (15-30 mg total) by mouth at bedtime. TAKE 1 TABLET BY MOUTH EVERY DAY AT BEDTIME Plotnikov, Aleksei V, MD  Active   nicotine  (NICODERM CQ  - DOSED IN MG/24 HOURS) 14 mg/24hr patch [id number] Yes Place 1 patch (14 mg total) onto the skin daily. Drusilla Sabas RAMAN, MD  Active   ondansetron  (ZOFRAN ) 4 MG tablet [id number] Yes Take 1 tablet (4 mg total) by mouth every 8 (eight) hours as needed for nausea or vomiting. Plotnikov, Aleksei V, MD  Active Self, Pharmacy Records  pantoprazole  (PROTONIX ) 40 MG tablet [id number] Yes Take 1 tablet (40 mg total) by mouth daily. Plotnikov, Aleksei V, MD  Active   sucralfate  (CARAFATE ) 1 g tablet [id number] Yes TAKE 1 TABLET BY MOUTH 3 TIMES DAILY. Suzann Inocente HERO, MD  Active   venlafaxine  XR (EFFEXOR -XR) 150 MG 24 hr capsule [id number] Yes Take 1 capsule (150 mg total) by mouth daily with breakfast. Plotnikov, Aleksei V, MD  Active   VENTOLIN  HFA 108 (90 Base) MCG/ACT inhaler [id number] Yes TAKE 2 PUFFS BY MOUTH EVERY 6 HOURS AS NEEDED FOR WHEEZE OR SHORTNESS OF BREATH Plotnikov, Karlynn GAILS, MD  Active             Home Care and Equipment/Supplies: Were Home Health Services Ordered?: NA Any  new equipment or medical supplies ordered?: NA  Functional Questionnaire: Do you need assistance with bathing/showering or dressing?: No Do you need assistance with meal preparation?: No Do you need assistance with eating?: No Do you have difficulty maintaining continence: No Do you need assistance with getting out of bed/getting out of a chair/moving?: No Do you have difficulty managing or taking your medications?: No  Follow up  appointments reviewed: PCP Follow-up appointment confirmed?: Yes Date of PCP follow-up appointment?: 10/25/24 Follow-up Provider: [facility] Follow-up appointment confirmed?: No Reason Specialist Follow-Up Not Confirmed: Patient has Specialist Provider Number and will Call for Appointment Do you need transportation to your follow-up appointment?: No Do you understand care options if your condition(s) worsen?: Yes-patient verbalized understanding    CELESTINO Carrier M,CMA "

## 2024-10-24 ENCOUNTER — Encounter: Payer: Self-pay | Admitting: Physician Assistant

## 2024-10-25 ENCOUNTER — Ambulatory Visit: Admitting: Internal Medicine

## 2024-10-25 ENCOUNTER — Encounter: Payer: Self-pay | Admitting: Internal Medicine

## 2024-10-25 ENCOUNTER — Ambulatory Visit: Admitting: Physician Assistant

## 2024-10-25 ENCOUNTER — Encounter: Payer: Self-pay | Admitting: Physician Assistant

## 2024-10-25 VITALS — BP 80/54 | HR 110 | Resp 18 | Ht 65.0 in | Wt 106.0 lb

## 2024-10-25 VITALS — BP 92/62 | Ht 65.0 in | Wt 107.6 lb

## 2024-10-25 DIAGNOSIS — M545 Low back pain, unspecified: Secondary | ICD-10-CM

## 2024-10-25 DIAGNOSIS — Z8673 Personal history of transient ischemic attack (TIA), and cerebral infarction without residual deficits: Secondary | ICD-10-CM | POA: Diagnosis not present

## 2024-10-25 DIAGNOSIS — F411 Generalized anxiety disorder: Secondary | ICD-10-CM

## 2024-10-25 DIAGNOSIS — F419 Anxiety disorder, unspecified: Secondary | ICD-10-CM

## 2024-10-25 DIAGNOSIS — I639 Cerebral infarction, unspecified: Secondary | ICD-10-CM | POA: Diagnosis not present

## 2024-10-25 DIAGNOSIS — I708 Atherosclerosis of other arteries: Secondary | ICD-10-CM

## 2024-10-25 DIAGNOSIS — F332 Major depressive disorder, recurrent severe without psychotic features: Secondary | ICD-10-CM

## 2024-10-25 DIAGNOSIS — G43109 Migraine with aura, not intractable, without status migrainosus: Secondary | ICD-10-CM | POA: Diagnosis not present

## 2024-10-25 DIAGNOSIS — F32A Depression, unspecified: Secondary | ICD-10-CM | POA: Diagnosis not present

## 2024-10-25 DIAGNOSIS — G8929 Other chronic pain: Secondary | ICD-10-CM | POA: Diagnosis not present

## 2024-10-25 DIAGNOSIS — Z1211 Encounter for screening for malignant neoplasm of colon: Secondary | ICD-10-CM

## 2024-10-25 MED ORDER — BUTALBITAL-APAP-CAFFEINE 50-325-40 MG PO TABS: 2.0000 | ORAL_TABLET | Freq: Three times a day (TID) | ORAL | 1 refills | Status: AC | PRN

## 2024-10-25 MED ORDER — CLOPIDOGREL BISULFATE 75 MG PO TABS: 75.0000 mg | ORAL_TABLET | Freq: Every day | ORAL | 3 refills | Status: AC

## 2024-10-25 MED ORDER — CLOTRIMAZOLE-BETAMETHASONE 1-0.05 % EX CREA: 1.0000 | TOPICAL_CREAM | Freq: Two times a day (BID) | CUTANEOUS | 3 refills | Status: AC

## 2024-10-25 NOTE — Patient Instructions (Addendum)
 Referral to vascular surgery   Referral to vestibular therapy Continue Plavix  75 mg daily and the lipitor  Discontinue the cigarettes Continue PT for strength and balance Follow up in 6 months

## 2024-10-25 NOTE — Assessment & Plan Note (Signed)
 F/u w/your psychologist On Effexor

## 2024-10-25 NOTE — Assessment & Plan Note (Signed)
  Fioricet is the only thing that helps w/HA. Not to be used at work, when driving etc  Not seeing Dr Vear Clock - health ins is not covering

## 2024-10-25 NOTE — Assessment & Plan Note (Signed)
 C/o bad HAs: needs 2 Fioricets to help w/her HAs
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
## 2024-10-25 NOTE — Assessment & Plan Note (Signed)
-  Presented with headache and vertigo since morning of 10/17/2024 -CT head and neck showed occlusion of the mid to distal left P2 branch vessel of indeterminate acuity, severe stenosis of both vertebral artery origins, 65% stenosis of proximal right ICA -Brain MRI showed small acute inferior left cerebellar infarct -Out of window for IV thrombolytics at time of presentation -Started on Plavix  75 mg daily; neurology consulted -2D echo showed EF of 60-65% -LDL 192; started on atorvastatin  80 mg daily -PT/OT obtained, patient to get outpatient PT - Passed swallow screen, will start diet today -Neurology recommends to discharge home on Plavix  75 mg daily and atorvastatin  80 mg daily  PT is pending
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
## 2024-10-27 ENCOUNTER — Encounter: Payer: Self-pay | Admitting: Physical Therapy

## 2024-10-27 ENCOUNTER — Ambulatory Visit: Attending: Family Medicine | Admitting: Physical Therapy

## 2024-10-27 VITALS — BP 88/65 | HR 113

## 2024-10-27 DIAGNOSIS — R42 Dizziness and giddiness: Secondary | ICD-10-CM | POA: Insufficient documentation

## 2024-10-27 DIAGNOSIS — I639 Cerebral infarction, unspecified: Secondary | ICD-10-CM | POA: Diagnosis present

## 2024-10-27 DIAGNOSIS — R2681 Unsteadiness on feet: Secondary | ICD-10-CM | POA: Diagnosis present

## 2024-10-27 NOTE — Addendum Note (Signed)
 Addended by: ANNETT SHEFFIELD SAILOR on: 10/27/2024 03:43 PM   Modules accepted: Orders

## 2024-10-29 ENCOUNTER — Encounter: Payer: Self-pay | Admitting: Internal Medicine

## 2024-10-29 ENCOUNTER — Other Ambulatory Visit: Payer: Self-pay | Admitting: Internal Medicine

## 2024-10-30 ENCOUNTER — Ambulatory Visit: Admitting: Physical Therapy

## 2024-11-02 ENCOUNTER — Ambulatory Visit: Admitting: Physical Therapy

## 2024-11-02 ENCOUNTER — Other Ambulatory Visit: Payer: Self-pay | Admitting: Vascular Surgery

## 2024-11-02 DIAGNOSIS — I6529 Occlusion and stenosis of unspecified carotid artery: Secondary | ICD-10-CM

## 2024-11-03 ENCOUNTER — Ambulatory Visit: Admitting: Physical Therapy

## 2024-11-07 ENCOUNTER — Ambulatory Visit: Admitting: Physical Therapy

## 2024-11-09 ENCOUNTER — Ambulatory Visit: Admitting: Physical Therapy

## 2024-11-09 ENCOUNTER — Encounter: Payer: Self-pay | Admitting: Physical Therapy

## 2024-11-09 VITALS — BP 97/74 | HR 87

## 2024-11-09 DIAGNOSIS — R2681 Unsteadiness on feet: Secondary | ICD-10-CM

## 2024-11-09 DIAGNOSIS — R42 Dizziness and giddiness: Secondary | ICD-10-CM

## 2024-11-09 DIAGNOSIS — I639 Cerebral infarction, unspecified: Secondary | ICD-10-CM
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
## 2024-11-14 ENCOUNTER — Ambulatory Visit: Admitting: Vascular Surgery

## 2024-11-14 ENCOUNTER — Encounter: Payer: Self-pay | Admitting: Vascular Surgery

## 2024-11-14 ENCOUNTER — Ambulatory Visit: Admitting: Physical Therapy

## 2024-11-14 ENCOUNTER — Ambulatory Visit (HOSPITAL_COMMUNITY): Admission: RE | Admit: 2024-11-14 | Discharge: 2024-11-14 | Attending: Vascular Surgery

## 2024-11-14 VITALS — BP 122/76 | HR 82 | Temp 98.1°F | Resp 18 | Ht 65.0 in | Wt 114.3 lb

## 2024-11-14 DIAGNOSIS — I6529 Occlusion and stenosis of unspecified carotid artery: Secondary | ICD-10-CM | POA: Diagnosis not present

## 2024-11-14 DIAGNOSIS — I6523 Occlusion and stenosis of bilateral carotid arteries: Secondary | ICD-10-CM | POA: Diagnosis not present

## 2024-11-14 DIAGNOSIS — I779 Disorder of arteries and arterioles, unspecified: Secondary | ICD-10-CM | POA: Insufficient documentation

## 2024-11-15 ENCOUNTER — Other Ambulatory Visit: Payer: Self-pay

## 2024-11-15 DIAGNOSIS — I6523 Occlusion and stenosis of bilateral carotid arteries: Secondary | ICD-10-CM

## 2024-11-16 ENCOUNTER — Encounter: Payer: Self-pay | Admitting: Physical Therapy

## 2024-11-16 ENCOUNTER — Ambulatory Visit: Admitting: Physical Therapy

## 2024-11-16 ENCOUNTER — Other Ambulatory Visit: Payer: Self-pay | Admitting: Internal Medicine

## 2024-11-16 VITALS — BP 127/76 | HR 81

## 2024-11-16 DIAGNOSIS — R42 Dizziness and giddiness: Secondary | ICD-10-CM

## 2024-11-16 DIAGNOSIS — R2681 Unsteadiness on feet: Secondary | ICD-10-CM

## 2024-11-16 DIAGNOSIS — I639 Cerebral infarction, unspecified: Secondary | ICD-10-CM
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
## 2024-11-21 ENCOUNTER — Ambulatory Visit: Admitting: Physical Therapy

## 2024-11-23 ENCOUNTER — Ambulatory Visit: Admitting: Physical Therapy

## 2024-11-27 ENCOUNTER — Encounter: Admitting: Student-PharmD

## 2024-11-27 ENCOUNTER — Inpatient Hospital Stay (HOSPITAL_COMMUNITY): Admit: 2024-11-27 | Admitting: Vascular Surgery

## 2024-11-28 ENCOUNTER — Ambulatory Visit: Admitting: Physical Therapy

## 2024-11-28 ENCOUNTER — Encounter: Admitting: Vascular Surgery

## 2024-11-28 ENCOUNTER — Ambulatory Visit (HOSPITAL_COMMUNITY)

## 2024-11-30 ENCOUNTER — Ambulatory Visit: Admitting: Physical Therapy

## 2024-12-05 ENCOUNTER — Ambulatory Visit: Admitting: Physical Therapy

## 2024-12-05 ENCOUNTER — Ambulatory Visit: Admitting: Internal Medicine

## 2024-12-07 ENCOUNTER — Ambulatory Visit: Admitting: Physical Therapy

## 2024-12-08 ENCOUNTER — Ambulatory Visit: Payer: Self-pay | Admitting: Physician Assistant

## 2025-04-25 ENCOUNTER — Ambulatory Visit: Payer: Self-pay | Admitting: Physician Assistant
